# Patient Record
Sex: Female | Born: 1985 | Race: White | Hispanic: No | Marital: Single | Smoking: Never smoker
Health system: Southern US, Community
[De-identification: ages and names within clinical notes are randomized; demographics above are authoritative.]

## PROBLEM LIST (undated history)

## (undated) DIAGNOSIS — I471 Supraventricular tachycardia, unspecified: Secondary | ICD-10-CM

## (undated) DIAGNOSIS — J45909 Unspecified asthma, uncomplicated: Secondary | ICD-10-CM

## (undated) DIAGNOSIS — F419 Anxiety disorder, unspecified: Secondary | ICD-10-CM

## (undated) DIAGNOSIS — I10 Essential (primary) hypertension: Secondary | ICD-10-CM

## (undated) DIAGNOSIS — E063 Autoimmune thyroiditis: Secondary | ICD-10-CM

## (undated) DIAGNOSIS — R519 Headache, unspecified: Secondary | ICD-10-CM

## (undated) DIAGNOSIS — M329 Systemic lupus erythematosus, unspecified: Secondary | ICD-10-CM

## (undated) DIAGNOSIS — E039 Hypothyroidism, unspecified: Secondary | ICD-10-CM

## (undated) HISTORY — DX: Hypothyroidism, unspecified: E03.9

## (undated) HISTORY — DX: Headache, unspecified: R51.9

## (undated) HISTORY — DX: Unspecified asthma, uncomplicated: J45.909

## (undated) HISTORY — DX: Anxiety disorder, unspecified: F41.9

## (undated) HISTORY — DX: Supraventricular tachycardia: I47.1

## (undated) HISTORY — DX: Supraventricular tachycardia, unspecified: I47.10

---

## 2016-12-13 ENCOUNTER — Encounter (HOSPITAL_COMMUNITY): Payer: Self-pay | Admitting: *Deleted

## 2016-12-13 ENCOUNTER — Ambulatory Visit (HOSPITAL_COMMUNITY)
Admission: EM | Admit: 2016-12-13 | Discharge: 2016-12-15 | Disposition: A | Discharge status: Home / Self Care | Payer: Managed Care, Other (non HMO) | Attending: Surgery | Admitting: Surgery

## 2016-12-13 DIAGNOSIS — K353 Acute appendicitis with localized peritonitis, without perforation or gangrene: Secondary | ICD-10-CM

## 2016-12-13 DIAGNOSIS — I10 Essential (primary) hypertension: Secondary | ICD-10-CM | POA: Diagnosis not present

## 2016-12-13 DIAGNOSIS — K352 Acute appendicitis with generalized peritonitis: Principal | ICD-10-CM | POA: Insufficient documentation

## 2016-12-13 DIAGNOSIS — Z7951 Long term (current) use of inhaled steroids: Secondary | ICD-10-CM | POA: Diagnosis not present

## 2016-12-13 DIAGNOSIS — K3580 Unspecified acute appendicitis: Secondary | ICD-10-CM | POA: Diagnosis present

## 2016-12-13 DIAGNOSIS — K37 Unspecified appendicitis: Secondary | ICD-10-CM | POA: Diagnosis present

## 2016-12-13 DIAGNOSIS — R000 Tachycardia, unspecified: Secondary | ICD-10-CM | POA: Diagnosis not present

## 2016-12-13 DIAGNOSIS — Z79899 Other long term (current) drug therapy: Secondary | ICD-10-CM | POA: Insufficient documentation

## 2016-12-13 HISTORY — DX: Essential (primary) hypertension: I10

## 2016-12-13 LAB — CBC
HEMATOCRIT: 45 % (ref 36.0–46.0)
HEMOGLOBIN: 15.2 g/dL — AB (ref 12.0–15.0)
MCH: 28.1 pg (ref 26.0–34.0)
MCHC: 33.8 g/dL (ref 30.0–36.0)
MCV: 83.2 fL (ref 78.0–100.0)
Platelets: 330 10*3/uL (ref 150–400)
RBC: 5.41 MIL/uL — AB (ref 3.87–5.11)
RDW: 14.3 % (ref 11.5–15.5)
WBC: 21 10*3/uL — AB (ref 4.0–10.5)

## 2016-12-13 LAB — COMPREHENSIVE METABOLIC PANEL
ALBUMIN: 3.6 g/dL (ref 3.5–5.0)
ALK PHOS: 36 U/L — AB (ref 38–126)
ALT: 16 U/L (ref 14–54)
ANION GAP: 7 (ref 5–15)
AST: 20 U/L (ref 15–41)
BUN: 11 mg/dL (ref 6–20)
CO2: 26 mmol/L (ref 22–32)
CREATININE: 0.96 mg/dL (ref 0.44–1.00)
Calcium: 8.6 mg/dL — ABNORMAL LOW (ref 8.9–10.3)
Chloride: 103 mmol/L (ref 101–111)
GFR calc Af Amer: >60 mL/min (ref >60)
GFR calc non Af Amer: >60 mL/min (ref >60)
Glucose, Bld: 104 mg/dL — ABNORMAL HIGH (ref 65–99)
Potassium: 4.3 mmol/L (ref 3.5–5.1)
SODIUM: 136 mmol/L (ref 135–145)
Total Bilirubin: 0.4 mg/dL (ref 0.3–1.2)
Total Protein: 5.5 g/dL — ABNORMAL LOW (ref 6.5–8.1)

## 2016-12-13 LAB — URINALYSIS, ROUTINE W REFLEX MICROSCOPIC
Bilirubin Urine: NEGATIVE
GLUCOSE, UA: NEGATIVE mg/dL
HGB URINE DIPSTICK: NEGATIVE
Ketones, ur: NEGATIVE mg/dL
LEUKOCYTES UA: NEGATIVE
Nitrite: NEGATIVE
PH: 6 (ref 5.0–8.0)
PROTEIN: NEGATIVE mg/dL
SPECIFIC GRAVITY, URINE: 1.016 (ref 1.005–1.030)

## 2016-12-13 LAB — POC URINE PREG, ED: Preg Test, Ur: NEGATIVE

## 2016-12-13 LAB — LIPASE, BLOOD: Lipase: 18 U/L (ref 11–51)

## 2016-12-13 NOTE — ED Triage Notes (Signed)
The pt has had abd pain all week and not feeling well  Her abd pain is worse today and she has felt worse   With fever  lmp  2 weeks ago

## 2016-12-14 ENCOUNTER — Inpatient Hospital Stay (HOSPITAL_COMMUNITY): Payer: Managed Care, Other (non HMO) | Admitting: Certified Registered Nurse Anesthetist

## 2016-12-14 ENCOUNTER — Encounter (HOSPITAL_COMMUNITY): Admission: EM | Disposition: A | Discharge status: Home / Self Care | Payer: Self-pay | Source: Home / Self Care

## 2016-12-14 ENCOUNTER — Emergency Department (HOSPITAL_COMMUNITY): Payer: Managed Care, Other (non HMO)

## 2016-12-14 ENCOUNTER — Encounter (HOSPITAL_COMMUNITY): Payer: Self-pay | Admitting: Radiology

## 2016-12-14 DIAGNOSIS — K3580 Unspecified acute appendicitis: Secondary | ICD-10-CM | POA: Diagnosis present

## 2016-12-14 DIAGNOSIS — K37 Unspecified appendicitis: Secondary | ICD-10-CM | POA: Diagnosis present

## 2016-12-14 HISTORY — PX: LAPAROSCOPIC APPENDECTOMY: SHX408

## 2016-12-14 LAB — SURGICAL PCR SCREEN
MRSA, PCR: NEGATIVE
STAPHYLOCOCCUS AUREUS: POSITIVE — AB

## 2016-12-14 LAB — HIV ANTIBODY (ROUTINE TESTING W REFLEX): HIV Screen 4th Generation wRfx: NONREACTIVE

## 2016-12-14 SURGERY — APPENDECTOMY, LAPAROSCOPIC
Anesthesia: General | Site: Abdomen

## 2016-12-14 MED ORDER — IOPAMIDOL (ISOVUE-300) INJECTION 61%
INTRAVENOUS | Status: AC
  Administered 2016-12-14: 100 mL
  Filled 2016-12-14: qty 100

## 2016-12-14 MED ORDER — MEPERIDINE HCL 25 MG/ML IJ SOLN: 6.2500 mg | INTRAMUSCULAR | Status: DC | PRN

## 2016-12-14 MED ORDER — LIDOCAINE HCL (CARDIAC) 20 MG/ML IV SOLN
INTRAVENOUS | Status: DC | PRN
  Administered 2016-12-14: 100 mg via INTRAVENOUS

## 2016-12-14 MED ORDER — BUPIVACAINE HCL (PF) 0.25 % IJ SOLN
INTRAMUSCULAR | Status: DC | PRN
  Administered 2016-12-14: 20 mL

## 2016-12-14 MED ORDER — ONDANSETRON HCL 4 MG/2ML IJ SOLN
4.0000 mg | Freq: Once | INTRAMUSCULAR | Status: AC
  Administered 2016-12-14: 4 mg via INTRAVENOUS
  Filled 2016-12-14: qty 2

## 2016-12-14 MED ORDER — 0.9 % SODIUM CHLORIDE (POUR BTL) OPTIME
TOPICAL | Status: DC | PRN
  Administered 2016-12-14: 1000 mL

## 2016-12-14 MED ORDER — MORPHINE SULFATE (PF) 4 MG/ML IV SOLN
1.0000 mg | INTRAVENOUS | Status: DC | PRN
  Administered 2016-12-15: 4 mg via INTRAVENOUS
  Filled 2016-12-14: qty 1

## 2016-12-14 MED ORDER — KCL IN DEXTROSE-NACL 20-5-0.9 MEQ/L-%-% IV SOLN
INTRAVENOUS | Status: DC
  Administered 2016-12-14: 15:00:00 via INTRAVENOUS
  Filled 2016-12-14 (×2): qty 1000

## 2016-12-14 MED ORDER — ONDANSETRON 4 MG PO TBDP
4.0000 mg | ORAL_TABLET | Freq: Four times a day (QID) | ORAL | Status: DC | PRN
Start: 2016-12-14 — End: 2016-12-15

## 2016-12-14 MED ORDER — ATENOLOL 25 MG PO TABS
25.0000 mg | ORAL_TABLET | Freq: Every day | ORAL | Status: DC
  Administered 2016-12-14 – 2016-12-15 (×2): 25 mg via ORAL
  Filled 2016-12-14 (×2): qty 1

## 2016-12-14 MED ORDER — DEXTROSE 5 % IV SOLN
2.0000 g | INTRAVENOUS | Status: DC
Start: 2016-12-14 — End: 2016-12-14
  Administered 2016-12-14: 2 g via INTRAVENOUS
  Filled 2016-12-14: qty 2

## 2016-12-14 MED ORDER — KETOROLAC TROMETHAMINE 30 MG/ML IJ SOLN
30.0000 mg | Freq: Once | INTRAMUSCULAR | Status: DC | PRN
  Administered 2016-12-14: 30 mg via INTRAVENOUS

## 2016-12-14 MED ORDER — ONDANSETRON HCL 4 MG/2ML IJ SOLN
INTRAMUSCULAR | Status: DC | PRN
  Administered 2016-12-14: 4 mg via INTRAVENOUS

## 2016-12-14 MED ORDER — ONDANSETRON 4 MG PO TBDP: 4.0000 mg | ORAL_TABLET | Freq: Four times a day (QID) | ORAL | Status: DC | PRN

## 2016-12-14 MED ORDER — PROMETHAZINE HCL 25 MG/ML IJ SOLN
INTRAMUSCULAR | Status: AC
  Filled 2016-12-14: qty 1

## 2016-12-14 MED ORDER — EPHEDRINE 5 MG/ML INJ
INTRAVENOUS | Status: AC
  Filled 2016-12-14: qty 10

## 2016-12-14 MED ORDER — SUCCINYLCHOLINE CHLORIDE 200 MG/10ML IV SOSY
PREFILLED_SYRINGE | INTRAVENOUS | Status: AC
  Filled 2016-12-14: qty 10

## 2016-12-14 MED ORDER — MIDAZOLAM HCL 5 MG/5ML IJ SOLN
INTRAMUSCULAR | Status: DC | PRN
  Administered 2016-12-14: 2 mg via INTRAVENOUS

## 2016-12-14 MED ORDER — HYDROMORPHONE HCL 1 MG/ML IJ SOLN
0.2500 mg | INTRAMUSCULAR | Status: DC | PRN
  Administered 2016-12-14: 0.5 mg via INTRAVENOUS

## 2016-12-14 MED ORDER — PANTOPRAZOLE SODIUM 40 MG IV SOLR
40.0000 mg | Freq: Every day | INTRAVENOUS | Status: DC
  Administered 2016-12-14: 40 mg via INTRAVENOUS
  Filled 2016-12-14: qty 40

## 2016-12-14 MED ORDER — HYDROCODONE-ACETAMINOPHEN 5-325 MG PO TABS
1.0000 | ORAL_TABLET | ORAL | Status: DC | PRN
  Administered 2016-12-14 (×2): 1 via ORAL
  Filled 2016-12-14: qty 2
  Filled 2016-12-14 (×2): qty 1

## 2016-12-14 MED ORDER — HYDROMORPHONE HCL 1 MG/ML IJ SOLN
0.5000 mg | Freq: Once | INTRAMUSCULAR | Status: AC
  Administered 2016-12-14: 0.5 mg via INTRAVENOUS
  Filled 2016-12-14: qty 1

## 2016-12-14 MED ORDER — PROPOFOL 10 MG/ML IV BOLUS
INTRAVENOUS | Status: AC
  Filled 2016-12-14: qty 20

## 2016-12-14 MED ORDER — METRONIDAZOLE IN NACL 5-0.79 MG/ML-% IV SOLN
500.0000 mg | Freq: Three times a day (TID) | INTRAVENOUS | Status: DC
  Administered 2016-12-14: 500 mg via INTRAVENOUS
  Filled 2016-12-14 (×2): qty 100

## 2016-12-14 MED ORDER — HYDROMORPHONE HCL 1 MG/ML IJ SOLN
INTRAMUSCULAR | Status: AC
  Filled 2016-12-14: qty 0.5

## 2016-12-14 MED ORDER — KETOROLAC TROMETHAMINE 30 MG/ML IJ SOLN
INTRAMUSCULAR | Status: AC
  Filled 2016-12-14: qty 1

## 2016-12-14 MED ORDER — HEPARIN SODIUM (PORCINE) 5000 UNIT/ML IJ SOLN
5000.0000 [IU] | Freq: Three times a day (TID) | INTRAMUSCULAR | Status: DC
  Administered 2016-12-15: 5000 [IU] via SUBCUTANEOUS
  Filled 2016-12-14: qty 1

## 2016-12-14 MED ORDER — MIDAZOLAM HCL 2 MG/2ML IJ SOLN
INTRAMUSCULAR | Status: AC
  Filled 2016-12-14: qty 2

## 2016-12-14 MED ORDER — SUCCINYLCHOLINE CHLORIDE 200 MG/10ML IV SOSY
PREFILLED_SYRINGE | INTRAVENOUS | Status: DC | PRN
  Administered 2016-12-14: 100 mg via INTRAVENOUS

## 2016-12-14 MED ORDER — SODIUM CHLORIDE 0.9 % IV SOLN
Freq: Once | INTRAVENOUS | Status: AC
  Administered 2016-12-14: via INTRAVENOUS

## 2016-12-14 MED ORDER — ENOXAPARIN SODIUM 40 MG/0.4ML ~~LOC~~ SOLN: 40.0000 mg | SUBCUTANEOUS | Status: DC

## 2016-12-14 MED ORDER — ALBUTEROL SULFATE (2.5 MG/3ML) 0.083% IN NEBU: 3.0000 mL | INHALATION_SOLUTION | Freq: Four times a day (QID) | RESPIRATORY_TRACT | Status: DC | PRN

## 2016-12-14 MED ORDER — ONDANSETRON HCL 4 MG/2ML IJ SOLN
INTRAMUSCULAR | Status: AC
  Filled 2016-12-14: qty 2

## 2016-12-14 MED ORDER — BUPIVACAINE HCL (PF) 0.25 % IJ SOLN
INTRAMUSCULAR | Status: AC
  Filled 2016-12-14: qty 30

## 2016-12-14 MED ORDER — FENTANYL CITRATE (PF) 250 MCG/5ML IJ SOLN
INTRAMUSCULAR | Status: AC
  Filled 2016-12-14: qty 5

## 2016-12-14 MED ORDER — PROMETHAZINE HCL 25 MG/ML IJ SOLN
6.2500 mg | INTRAMUSCULAR | Status: DC | PRN
  Administered 2016-12-14: 6.25 mg via INTRAVENOUS

## 2016-12-14 MED ORDER — ONDANSETRON HCL 4 MG/2ML IJ SOLN: 4.0000 mg | Freq: Four times a day (QID) | INTRAMUSCULAR | Status: DC | PRN

## 2016-12-14 MED ORDER — LACTATED RINGERS IV SOLN
INTRAVENOUS | Status: DC
  Administered 2016-12-14 (×2): via INTRAVENOUS

## 2016-12-14 MED ORDER — PROPOFOL 10 MG/ML IV BOLUS
INTRAVENOUS | Status: DC | PRN
  Administered 2016-12-14: 200 mg via INTRAVENOUS

## 2016-12-14 MED ORDER — ROCURONIUM BROMIDE 100 MG/10ML IV SOLN
INTRAVENOUS | Status: DC | PRN
  Administered 2016-12-14: 50 mg via INTRAVENOUS

## 2016-12-14 MED ORDER — HYDROMORPHONE HCL 1 MG/ML IJ SOLN
1.0000 mg | INTRAMUSCULAR | Status: DC | PRN
  Administered 2016-12-14: 1 mg via INTRAVENOUS
  Filled 2016-12-14: qty 1

## 2016-12-14 MED ORDER — SODIUM CHLORIDE 0.9 % IR SOLN
Status: DC | PRN
Start: 2016-12-14 — End: 2016-12-14
  Administered 2016-12-14: 1000 mL

## 2016-12-14 MED ORDER — DEXTROSE IN LACTATED RINGERS 5 % IV SOLN
INTRAVENOUS | Status: DC
  Administered 2016-12-14: 05:00:00 via INTRAVENOUS

## 2016-12-14 MED ORDER — FENTANYL CITRATE (PF) 100 MCG/2ML IJ SOLN
INTRAMUSCULAR | Status: DC | PRN
  Administered 2016-12-14: 200 ug via INTRAVENOUS

## 2016-12-14 MED ORDER — SUGAMMADEX SODIUM 200 MG/2ML IV SOLN
INTRAVENOUS | Status: DC | PRN
  Administered 2016-12-14: 400 mg via INTRAVENOUS

## 2016-12-14 SURGICAL SUPPLY — 33 items
APPLIER CLIP ROT 10 11.4 M/L (STAPLE) ×3
BLADE CLIPPER SURG (BLADE) IMPLANT
CANISTER SUCT 3000ML PPV (MISCELLANEOUS) ×3 IMPLANT
CHLORAPREP W/TINT 26ML (MISCELLANEOUS) ×3 IMPLANT
CLIP APPLIE ROT 10 11.4 M/L (STAPLE) ×1 IMPLANT
COVER SURGICAL LIGHT HANDLE (MISCELLANEOUS) ×3 IMPLANT
CUTTER FLEX LINEAR 45M (STAPLE) ×3 IMPLANT
DERMABOND ADVANCED (GAUZE/BANDAGES/DRESSINGS) ×2
DERMABOND ADVANCED .7 DNX12 (GAUZE/BANDAGES/DRESSINGS) ×1 IMPLANT
ELECT REM PT RETURN 9FT ADLT (ELECTROSURGICAL) ×3
ELECTRODE REM PT RTRN 9FT ADLT (ELECTROSURGICAL) ×1 IMPLANT
ENDOLOOP SUT PDS II  0 18 (SUTURE)
ENDOLOOP SUT PDS II 0 18 (SUTURE) IMPLANT
GLOVE BIO SURGEON STRL SZ7.5 (GLOVE) ×3 IMPLANT
GOWN STRL REUS W/ TWL LRG LVL3 (GOWN DISPOSABLE) ×3 IMPLANT
GOWN STRL REUS W/TWL LRG LVL3 (GOWN DISPOSABLE) ×6
KIT BASIN OR (CUSTOM PROCEDURE TRAY) ×3 IMPLANT
KIT ROOM TURNOVER OR (KITS) ×3 IMPLANT
NS IRRIG 1000ML POUR BTL (IV SOLUTION) ×3 IMPLANT
PAD ARMBOARD 7.5X6 YLW CONV (MISCELLANEOUS) ×6 IMPLANT
POUCH SPECIMEN RETRIEVAL 10MM (ENDOMECHANICALS) ×3 IMPLANT
RELOAD STAPLE TA45 3.5 REG BLU (ENDOMECHANICALS) ×3 IMPLANT
SET IRRIG TUBING LAPAROSCOPIC (IRRIGATION / IRRIGATOR) ×3 IMPLANT
SHEARS HARMONIC ACE PLUS 36CM (ENDOMECHANICALS) ×3 IMPLANT
SPECIMEN JAR SMALL (MISCELLANEOUS) ×3 IMPLANT
SUT MNCRL AB 4-0 PS2 18 (SUTURE) ×3 IMPLANT
TOWEL OR 17X24 6PK STRL BLUE (TOWEL DISPOSABLE) ×3 IMPLANT
TOWEL OR 17X26 10 PK STRL BLUE (TOWEL DISPOSABLE) ×3 IMPLANT
TRAY FOLEY CATH SILVER 16FR (SET/KITS/TRAYS/PACK) ×3 IMPLANT
TRAY LAPAROSCOPIC MC (CUSTOM PROCEDURE TRAY) ×3 IMPLANT
TROCAR XCEL BLUNT TIP 100MML (ENDOMECHANICALS) ×3 IMPLANT
TROCAR XCEL NON-BLD 5MMX100MML (ENDOMECHANICALS) ×6 IMPLANT
TUBING INSUFFLATION (TUBING) ×3 IMPLANT

## 2016-12-14 NOTE — Progress Notes (Signed)
Patient OOB to shower, tolerated well.

## 2016-12-14 NOTE — Progress Notes (Signed)
CC:  Abdominal pain Subjective: Having some intermittent chills, but does not feel febrile. Ongoing pain lower abdomen, more on the right side than the left.    Objective: Vital signs in last 24 hours: Temp:  [98.1 F (36.7 C)-100.3 F (37.9 C)] 98.1 F (36.7 C) (05/09 2251) Pulse Rate:  [66-119] 66 (05/10 0430) Resp:  [20-22] 22 (05/09 2251) BP: (102-126)/(47-78) 102/47 (05/10 0430) SpO2:  [94 %-99 %] 96 % (05/10 0430) Last BM Date: 12/12/16 TM 100.3 no temp since 2200 last PM VSS  BP ok here with normal HR Labs last PM WBC 21K   Intake/Output from previous day: No intake/output data recorded. Intake/Output this shift: No intake/output data recorded.  General appearance: alert, cooperative and no distress GI: soft, tender right lower quadrant  Lab Results:   Recent Labs  12/13/16 2158  WBC 21.0*  HGB 15.2*  HCT 45.0  PLT 330    BMET  Recent Labs  12/13/16 2158  NA 136  K 4.3  CL 103  CO2 26  GLUCOSE 104*  BUN 11  CREATININE 0.96  CALCIUM 8.6*   PT/INR No results for input(s): LABPROT, INR in the last 72 hours.   Recent Labs Lab 12/13/16 2158  AST 20  ALT 16  ALKPHOS 36*  BILITOT 0.4  PROT 5.5*  ALBUMIN 3.6     Lipase     Component Value Date/Time   LIPASE 18 12/13/2016 2158     Studies/Results: Ct Abdomen Pelvis W Contrast  Result Date: 12/14/2016 CLINICAL DATA:  Right lower quadrant pain starting on Monday. Nausea and fever. EXAM: CT ABDOMEN AND PELVIS WITH CONTRAST TECHNIQUE: Multidetector CT imaging of the abdomen and pelvis was performed using the standard protocol following bolus administration of intravenous contrast. CONTRAST:  ISOVUE-300 IOPAMIDOL (ISOVUE-300) INJECTION 61% COMPARISON:  None. FINDINGS: Lower chest: Infiltration or atelectasis in both lung bases, greater on the left. Focal pneumonia not excluded. Hepatobiliary: No focal liver abnormality is seen. No gallstones, gallbladder wall thickening, or biliary  dilatation. Pancreas: Unremarkable. No pancreatic ductal dilatation or surrounding inflammatory changes. Spleen: Normal in size without focal abnormality. Adrenals/Urinary Tract: Adrenal glands are unremarkable. Kidneys are normal, without renal calculi, focal lesion, or hydronephrosis. Bladder is unremarkable. Stomach/Bowel: The distal appendix is distended with diameter measuring up to 13 mm. There is stranding around the distal appendix. Changes are consistent with tip appendicitis. No abscess. Stomach, small bowel, and colon are not abnormally distended. No wall thickening is identified. Scattered stool throughout the colon. Vascular/Lymphatic: No significant vascular findings are present. No enlarged abdominal or pelvic lymph nodes. Reproductive: Uterus is retroverted. Small cyst demonstrated in the left ovary, likely functional. No abnormal adnexal masses. Other: No abdominal wall hernia or abnormality. No abdominopelvic ascites. Musculoskeletal: No acute or significant osseous findings. IMPRESSION: Distal appendix is distended with stranding in the surrounding fat consistent with tip appendicitis. No abscess. Electronically Signed   By: Burman Nieves M.D.   On: 12/14/2016 01:24   Prior to Admission medications   Medication Sig Start Date End Date Taking? Authorizing Provider  albuterol (PROVENTIL HFA;VENTOLIN HFA) 108 (90 Base) MCG/ACT inhaler Inhale 1-2 puffs into the lungs every 6 (six) hours as needed for wheezing or shortness of breath.   Yes [provider]  atenolol (TENORMIN) 25 MG tablet Take 25 mg by mouth daily.   Yes [provider]  cetirizine (ZYRTEC) 10 MG tablet Take 10 mg by mouth daily.   Yes [provider]  cyclobenzaprine (FLEXERIL) 10  MG tablet Take 5-10 mg by mouth 3 (three) times daily as needed for muscle spasms.   Yes [provider]    Medications: . [START ON 12/15/2016] enoxaparin (LOVENOX) injection  40 mg Subcutaneous Q24H   .  dextrose 5% lactated ringers 125 mL/hr at 12/14/16 0510   Anti-infectives    Start     Dose/Rate Route Frequency Ordered Stop   12/14/16 0600  cefTRIAXone (ROCEPHIN) 2 g in dextrose 5 % 50 mL IVPB  Status:  Discontinued     2 g 100 mL/hr over 30 Minutes Intravenous Every 24 hours 12/14/16 0455 12/14/16 0738   12/14/16 0600  metroNIDAZOLE (FLAGYL) IVPB 500 mg  Status:  Discontinued     500 mg 100 mL/hr over 60 Minutes Intravenous Every 8 hours 12/14/16 0455 12/14/16 0738     Assessment/Plan Acute appendicitis Hypertension Tachycardia FEN:  IV fluids/NPO ID:  Pre op DVT:  SCD/Lovenox   Plan:  Surgery this AM.       LOS: 0 days    Sheri Johnson 12/14/2016 [phone]

## 2016-12-14 NOTE — H&P (Signed)
Sheri Johnson is an 31 y.o. female.   Chief Complaint: Abdominal pain HPI: Patient presents to the emergency room with a 3-day history of right lower quadrant abdominal pain. The pain started in her stomach repair upper abdominal region she states. It is now moved down to her right lower quadrant of her abdomen. It is constant. It is made worse with movement or pushing on the area. She's had some anorexia and nausea. No blood in her stool or diarrhea. He shows acute appendicitis without perforation. The pain is severe in nature as well as cramping. It is made better with pain medicine. Past Medical History:  Diagnosis Date  . Hypertension     History reviewed. No pertinent surgical history.  No family history on file. Social History:  reports that she has never smoked. She has never used smokeless tobacco. She reports that she does not drink alcohol. Her drug history is not on file.  Allergies: No Known Allergies  Medications Prior to Admission  Medication Sig Dispense Refill  . albuterol (PROVENTIL HFA;VENTOLIN HFA) 108 (90 Base) MCG/ACT inhaler Inhale 1-2 puffs into the lungs every 6 (six) hours as needed for wheezing or shortness of breath.    Marland Kitchen atenolol (TENORMIN) 25 MG tablet Take 25 mg by mouth daily.    . cetirizine (ZYRTEC) 10 MG tablet Take 10 mg by mouth daily.    . cyclobenzaprine (FLEXERIL) 10 MG tablet Take 5-10 mg by mouth 3 (three) times daily as needed for muscle spasms.      Results for orders placed or performed during the hospital encounter of 12/13/16 (from the past 48 hour(s))  Urinalysis, Routine w reflex microscopic     Status: Abnormal   Collection Time: 12/13/16  9:55 PM  Result Value Ref Range   Color, Urine YELLOW YELLOW   APPearance HAZY (A) CLEAR   Specific Gravity, Urine 1.016 1.005 - 1.030   pH 6.0 5.0 - 8.0   Glucose, UA NEGATIVE NEGATIVE mg/dL   Hgb urine dipstick NEGATIVE NEGATIVE   Bilirubin Urine NEGATIVE NEGATIVE   Ketones, ur NEGATIVE  NEGATIVE mg/dL   Protein, ur NEGATIVE NEGATIVE mg/dL   Nitrite NEGATIVE NEGATIVE   Leukocytes, UA NEGATIVE NEGATIVE  Lipase, blood     Status: None   Collection Time: 12/13/16  9:58 PM  Result Value Ref Range   Lipase 18 11 - 51 U/L  Comprehensive metabolic panel     Status: Abnormal   Collection Time: 12/13/16  9:58 PM  Result Value Ref Range   Sodium 136 135 - 145 mmol/L   Potassium 4.3 3.5 - 5.1 mmol/L   Chloride 103 101 - 111 mmol/L   CO2 26 22 - 32 mmol/L   Glucose, Bld 104 (H) 65 - 99 mg/dL   BUN 11 6 - 20 mg/dL   Creatinine, Ser 0.96 0.44 - 1.00 mg/dL   Calcium 8.6 (L) 8.9 - 10.3 mg/dL   Total Protein 5.5 (L) 6.5 - 8.1 g/dL   Albumin 3.6 3.5 - 5.0 g/dL   AST 20 15 - 41 U/L   ALT 16 14 - 54 U/L   Alkaline Phosphatase 36 (L) 38 - 126 U/L   Total Bilirubin 0.4 0.3 - 1.2 mg/dL   GFR calc non Af Amer >60 >60 mL/min   GFR calc Af Amer >60 >60 mL/min    Comment: (NOTE) The eGFR has been calculated using the CKD EPI equation. This calculation has not been validated in all clinical situations. eGFR's persistently <60 mL/min  signify possible Chronic Kidney Disease.    Anion gap 7 5 - 15  CBC     Status: Abnormal   Collection Time: 12/13/16  9:58 PM  Result Value Ref Range   WBC 21.0 (H) 4.0 - 10.5 K/uL   RBC 5.41 (H) 3.87 - 5.11 MIL/uL   Hemoglobin 15.2 (H) 12.0 - 15.0 g/dL   HCT 45.0 36.0 - 46.0 %   MCV 83.2 78.0 - 100.0 fL   MCH 28.1 26.0 - 34.0 pg   MCHC 33.8 30.0 - 36.0 g/dL   RDW 14.3 11.5 - 15.5 %   Platelets 330 150 - 400 K/uL  POC urine preg, ED     Status: None   Collection Time: 12/13/16 10:11 PM  Result Value Ref Range   Preg Test, Ur NEGATIVE NEGATIVE    Comment:        THE SENSITIVITY OF THIS METHODOLOGY IS >24 mIU/mL    Ct Abdomen Pelvis W Contrast  Result Date: 12/14/2016 CLINICAL DATA:  Right lower quadrant pain starting on Monday. Nausea and fever. EXAM: CT ABDOMEN AND PELVIS WITH CONTRAST TECHNIQUE: Multidetector CT imaging of the abdomen and  pelvis was performed using the standard protocol following bolus administration of intravenous contrast. CONTRAST:  191m ISOVUE-300 IOPAMIDOL (ISOVUE-300) INJECTION 61% COMPARISON:  None. FINDINGS: Lower chest: Infiltration or atelectasis in both lung bases, greater on the left. Focal pneumonia not excluded. Hepatobiliary: No focal liver abnormality is seen. No gallstones, gallbladder wall thickening, or biliary dilatation. Pancreas: Unremarkable. No pancreatic ductal dilatation or surrounding inflammatory changes. Spleen: Normal in size without focal abnormality. Adrenals/Urinary Tract: Adrenal glands are unremarkable. Kidneys are normal, without renal calculi, focal lesion, or hydronephrosis. Bladder is unremarkable. Stomach/Bowel: The distal appendix is distended with diameter measuring up to 13 mm. There is stranding around the distal appendix. Changes are consistent with tip appendicitis. No abscess. Stomach, small bowel, and colon are not abnormally distended. No wall thickening is identified. Scattered stool throughout the colon. Vascular/Lymphatic: No significant vascular findings are present. No enlarged abdominal or pelvic lymph nodes. Reproductive: Uterus is retroverted. Small cyst demonstrated in the left ovary, likely functional. No abnormal adnexal masses. Other: No abdominal wall hernia or abnormality. No abdominopelvic ascites. Musculoskeletal: No acute or significant osseous findings. IMPRESSION: Distal appendix is distended with stranding in the surrounding fat consistent with tip appendicitis. No abscess. Electronically Signed   By: WLucienne CapersM.D.   On: 12/14/2016 01:24    Review of Systems  Constitutional: Negative.  Negative for chills and fever.  HENT: Negative.  Negative for hearing loss and tinnitus.   Eyes: Negative.  Negative for blurred vision and double vision.  Respiratory: Negative.  Negative for cough and hemoptysis.   Cardiovascular: Negative.  Negative for chest pain  and palpitations.  Gastrointestinal: Positive for abdominal pain and nausea.  Genitourinary: Negative for dysuria and urgency.  Musculoskeletal: Negative for myalgias and neck pain.  Skin: Negative.  Negative for itching and rash.  Neurological: Negative for dizziness and headaches.  Endo/Heme/Allergies: Negative for environmental allergies. Does not bruise/bleed easily.  Psychiatric/Behavioral: Negative for depression and suicidal ideas.    Blood pressure (!) 102/47, pulse 66, temperature 98.1 F (36.7 C), temperature source Oral, resp. rate (!) 22, last menstrual period 11/22/2016, SpO2 96 %. Physical Exam  Constitutional: She is oriented to person, place, and time. She appears well-developed and well-nourished.  HENT:  Head: Normocephalic and atraumatic.  Eyes: Pupils are equal, round, and reactive to light. No scleral icterus.  Neck: Normal range of motion. Neck supple.  Cardiovascular: Normal rate and regular rhythm.   Respiratory: Effort normal and breath sounds normal. No respiratory distress.  GI: There is tenderness in the right lower quadrant. There is guarding and tenderness at McBurney's point.  Musculoskeletal: Normal range of motion.  Neurological: She is oriented to person, place, and time.  Skin: Skin is warm and dry.  Psychiatric: She has a normal mood and affect. Her behavior is normal.     Assessment/Plan Acute appendicitis  Admit for IV fluids and antibiotics.  Appendectomy later today by Dr. Burnell Blanks., MD 12/14/2016, 6:15 AM

## 2016-12-14 NOTE — Interval H&P Note (Signed)
History and Physical Interval Note:  12/14/2016 10:01 AM  Sheri DroneLaura Johnson  has presented today for surgery, with the diagnosis of Acute Appendicitis   The various methods of treatment have been discussed with the patient and family. After consideration of risks, benefits and other options for treatment, the patient has consented to  Procedure(s): APPENDECTOMY LAPAROSCOPIC (N/A) as a surgical intervention .  The patient's history has been reviewed, patient examined, no change in status, stable for surgery.  I have reviewed the patient's chart and labs.  Questions were answered to the patient's satisfaction.     TOTH III,PAUL S

## 2016-12-14 NOTE — Transfer of Care (Signed)
Immediate Anesthesia Transfer of Care Note  Patient: Felipe DroneLaura Matera  Procedure(s) Performed: Procedure(s): APPENDECTOMY LAPAROSCOPIC (N/A)  Patient Location: PACU  Anesthesia Type:General  Level of Consciousness: awake and patient cooperative  Airway & Oxygen Therapy: Patient Spontanous Breathing and Patient connected to nasal cannula oxygen  Post-op Assessment: Report given to RN, Post -op Vital signs reviewed and stable and Patient moving all extremities X 4  Post vital signs: Reviewed and stable  Last Vitals:  Vitals:   12/14/16 0430 12/14/16 1147  BP: (!) 102/47   Pulse: 66   Resp:    Temp:  36.9 C    Last Pain:  Vitals:   12/14/16 1147  TempSrc:   PainSc: (P) Asleep         Complications: No apparent anesthesia complications

## 2016-12-14 NOTE — Progress Notes (Signed)
Patient ambulating unit hall X 2.5 laps, tolerated well.

## 2016-12-14 NOTE — Discharge Instructions (Signed)

## 2016-12-14 NOTE — Op Note (Addendum)
12/13/2016 - 12/14/2016  11:25 AM  PATIENT:  Sheri Johnson  31 y.o. female  PRE-OPERATIVE DIAGNOSIS:  Acute Appendicitis   POST-OPERATIVE DIAGNOSIS:  Acute Appendicitis   PROCEDURE:  Procedure(s): APPENDECTOMY LAPAROSCOPIC (N/A)  SURGEON:  Surgeon(s) and Role:    Griselda Miner, MD - Primary  PHYSICIAN ASSISTANT:   ASSISTANTS: none   ANESTHESIA:   local and general  EBL:  Total I/O In: 1000 [I.V.:1000] Out: 100 [Urine:100]  BLOOD ADMINISTERED:none  DRAINS: none   LOCAL MEDICATIONS USED:  MARCAINE     SPECIMEN:  Source of Specimen:  appendix  DISPOSITION OF SPECIMEN:  PATHOLOGY  COUNTS:  YES  TOURNIQUET:  * No tourniquets in log *  DICTATION: .Dragon Dictation   After informed consent was obtained patient was brought to the operating room placed in the supine position on the operating room table. After adequate induction of general anesthesia the patient's abdomen was prepped with ChloraPrep, allowed to dry, and draped in usual sterile manner. An appropriate timeout was performed. The area below the umbilicus was infiltrated with quarter percent Marcaine. A small incision was made with a 15 blade knife. This incision was carried down through the subcutaneous tissue bluntly with a hemostat and [employer] retractors until the linea alba was identified. The linea alba was incised with a 15 blade knife. Each side was grasped Coker clamps and elevated anteriorly. The preperitoneal space was probed bluntly with a hemostat until the peritoneum was opened and access was gained to the abdominal cavity. A 0 Vicryl purse string stitch was placed in the fascia surrounding the opening. A Hassan cannula was placed through the opening and anchored in place with the previously placed Vicryl purse string stitch. The laparoscope was placed through the [facility] cannula. The abdomen was insufflated with carbon dioxide without difficulty. Next the suprapubic area was infiltrated with quarter  percent Marcaine. A small incision was made with a 15 blade knife. A 5 mm port was placed bluntly through this incision into the abdominal cavity. A site was then chosen between the 2 port for placement of a 5 mm port. The area was infiltrated with quarter percent Marcaine. A small stab incision was made with a 15 blade knife. A 5 mm port was placed bluntly through this incision and the abdominal cavity under direct vision. The laparoscope was then moved to the suprapubic port. Using a Glassman grasper and harmonic scalpel the right lower quadrant was inspected. The appendix was readily identified. The appendix was elevated anteriorly and the mesoappendix was taken down sharply with the harmonic scalpel. Once the base of the appendix where it joined the cecum was identified and cleared of any tissue then a laparoscopic GIA blue load 6 row stapler was placed through the [facility] cannula. The stapler was placed across the base of the appendix clamped and fired thereby dividing the base of the appendix between staple lines. A small area of bleeding from the staple line was controlled with clips. A laparoscopic bag was then inserted through the [facility] cannula. The appendix was placed within the bag and the bag was sealed. The abdomen was then irrigated with copious amounts of saline until the effluent was clear. No other abnormalities were noted except for an omental adhesion to the lower abdominal wall that was taken down with the harmonic scalpel.  The appendix and bag were removed with the [facility] cannula through the infraumbilical port without difficulty. The fascial defect was closed with the previously placed Vicryl pursestring stitch as  well as with another interrupted 0 Vicryl figure-of-eight stitch. The rest of the ports were removed under direct vision and were found to be hemostatic. The gas was allowed to escape. The skin incisions were closed with interrupted 4-0 Monocryl subcuticular stitches. Dermabond  dressings were applied. The patient tolerated the procedure well. At the end of the case all needle sponge and instrument counts were correct. The patient was then awakened and taken to recovery in stable condition.  PLAN OF CARE: Admit for overnight observation  PATIENT DISPOSITION:  PACU - hemodynamically stable.   Delay start of Pharmacological VTE agent (>24hrs) due to surgical blood loss or risk of bleeding: no

## 2016-12-14 NOTE — ED Notes (Addendum)
Zosyn 3.375 IVPB administered - per EDP order.

## 2016-12-14 NOTE — Anesthesia Procedure Notes (Signed)
Procedure Name: Intubation Date/Time: 12/14/2016 10:32 AM Performed by: Carney Living Pre-anesthesia Checklist: Patient identified, Emergency Drugs available, Suction available, Patient being monitored and Timeout performed Patient Re-evaluated:Patient Re-evaluated prior to inductionOxygen Delivery Method: Circle system utilized Preoxygenation: Pre-oxygenation with 100% oxygen Intubation Type: IV induction and Rapid sequence Laryngoscope Size: Mac and 4 Grade View: Grade I Tube size: 7.0 mm Number of attempts: 1 Airway Equipment and Method: Stylet Placement Confirmation: ETT inserted through vocal cords under direct vision,  positive ETCO2 and breath sounds checked- equal and bilateral Secured at: 22 cm Tube secured with: Tape Dental Injury: Teeth and Oropharynx as per pre-operative assessment

## 2016-12-14 NOTE — ED Notes (Signed)
Attempted to call report x 1 to [address] [place]. Left name & phone number with NS.

## 2016-12-14 NOTE — ED Notes (Signed)
Attempted to call report to floor x2. 

## 2016-12-14 NOTE — ED Notes (Signed)
Patient transported to CT 

## 2016-12-14 NOTE — Anesthesia Postprocedure Evaluation (Signed)
Anesthesia Post Note  Patient: Sheri DroneLaura Dibiasio  Procedure(s) Performed: Procedure(s) (LRB): APPENDECTOMY LAPAROSCOPIC (N/A)  Patient location during evaluation: PACU Anesthesia Type: General Level of consciousness: awake and alert Pain management: pain level controlled Vital Signs Assessment: post-procedure vital signs reviewed and stable Respiratory status: spontaneous breathing, nonlabored ventilation, respiratory function stable and patient connected to nasal cannula oxygen Cardiovascular status: blood pressure returned to baseline and stable Postop Assessment: no signs of nausea or vomiting Anesthetic complications: no       Last Vitals:  Vitals:   12/14/16 1252 12/14/16 1300  BP:  120/61  Pulse:    Resp:  14  Temp: 36.8 C 36.8 C    Last Pain:  Vitals:   12/14/16 1335  TempSrc:   PainSc: Asleep                 Anastassia Noack DAVID

## 2016-12-14 NOTE — ED Provider Notes (Signed)
[facility]-EMERGENCY DEPT Provider Note   CSN: [id number] Arrival date & time: 12/13/16  2132     History   Chief Complaint Chief Complaint  Patient presents with  . Abdominal Pain    HPI Sheri Johnson is a 31 y.o. female.  Patient reports symptoms of "not feeling well" several days ago. For the past 2 days she has had progressively worsening periumbilical and right sided abdominal pain. She reports lost appetite, nausea without vomiting and constipation. Only previous abdominal surgeries were C-sections. She has had a low grade temperature. No urinary or vaginal symptoms. No radiation of the pain to the back. No flank pain.    The history is provided by the patient. No language interpreter was used.  Abdominal Pain   This is a new problem. The current episode started 12 to 24 hours ago. The problem occurs constantly. The problem has been gradually worsening. The pain is located in the periumbilical region and RLQ. The pain is at a severity of 10/10. Associated symptoms include anorexia, fever (Low grade), nausea and constipation. Pertinent negatives include dysuria and myalgias.    History reviewed. No pertinent past medical history.  There are no active problems to display for this patient.   History reviewed. No pertinent surgical history.  OB History    No data available       Home Medications    Prior to Admission medications   Not on File    Family History No family history on file.  Social History Social History  Substance Use Topics  . Smoking status: Never Smoker  . Smokeless tobacco: Never Used  . Alcohol use No     Allergies   Patient has no known allergies.   Review of Systems Review of Systems  Constitutional: Positive for appetite change and fever (Low grade). Negative for diaphoresis.  Respiratory: Negative for shortness of breath.   Cardiovascular: Negative for chest pain.  Gastrointestinal: Positive for abdominal distention, abdominal  pain, anorexia, constipation and nausea.  Genitourinary: Negative for dysuria and vaginal discharge.  Musculoskeletal: Negative for back pain and myalgias.  Neurological: Negative for weakness and light-headedness.     Physical Exam Updated Vital Signs BP 126/71 (BP Location: Right Arm)   Pulse 72   Temp 98.1 F (36.7 C) (Oral)   Resp (!) 22   LMP 11/22/2016   SpO2 99%   Physical Exam  Constitutional: She is oriented to person, place, and time. She appears well-developed and well-nourished.  HENT:  Head: Normocephalic.  Neck: Normal range of motion. Neck supple.  Cardiovascular: Normal rate and regular rhythm.   Pulmonary/Chest: Effort normal and breath sounds normal. She has no wheezes. She has no rales.  Abdominal: Soft. Bowel sounds are normal. She exhibits no mass. There is tenderness (Periumbilical, RLQ and right lateral abdominal tenderness to palpation. ). There is guarding.  Musculoskeletal: Normal range of motion.  Neurological: She is alert and oriented to person, place, and time.  Skin: Skin is warm and dry. No rash noted.  Psychiatric: She has a normal mood and affect.     ED Treatments / Results  Labs (all labs ordered are listed, but only abnormal results are displayed) Labs Reviewed  COMPREHENSIVE METABOLIC PANEL - Abnormal; Notable for the following:       Result Value   Glucose, Bld 104 (*)    Calcium 8.6 (*)    Total Protein 5.5 (*)    Alkaline Phosphatase 36 (*)    All other components within normal  limits  CBC - Abnormal; Notable for the following:    WBC 21.0 (*)    RBC 5.41 (*)    Hemoglobin 15.2 (*)    All other components within normal limits  URINALYSIS, ROUTINE W REFLEX MICROSCOPIC - Abnormal; Notable for the following:    APPearance HAZY (*)    All other components within normal limits  LIPASE, BLOOD  POC URINE PREG, ED   Results for orders placed or performed during the hospital encounter of 12/13/16  Lipase, blood  Result Value Ref  Range   Lipase 18 11 - 51 U/L  Comprehensive metabolic panel  Result Value Ref Range   Sodium 136 135 - 145 mmol/L   Potassium 4.3 3.5 - 5.1 mmol/L   Chloride 103 101 - 111 mmol/L   CO2 26 22 - 32 mmol/L   Glucose, Bld 104 (H) 65 - 99 mg/dL   BUN 11 6 - 20 mg/dL   Creatinine, Ser 4.09 0.44 - 1.00 mg/dL   Calcium 8.6 (L) 8.9 - 10.3 mg/dL   Total Protein 5.5 (L) 6.5 - 8.1 g/dL   Albumin 3.6 3.5 - 5.0 g/dL   AST 20 15 - 41 U/L   ALT 16 14 - 54 U/L   Alkaline Phosphatase 36 (L) 38 - 126 U/L   Total Bilirubin 0.4 0.3 - 1.2 mg/dL   GFR calc non Af Amer >60 >60 mL/min   GFR calc Af Amer >60 >60 mL/min   Anion gap 7 5 - 15  CBC  Result Value Ref Range   WBC 21.0 (H) 4.0 - 10.5 K/uL   RBC 5.41 (H) 3.87 - 5.11 MIL/uL   Hemoglobin 15.2 (H) 12.0 - 15.0 g/dL   HCT 81.1 91.4 - 78.2 %   MCV 83.2 78.0 - 100.0 fL   MCH 28.1 26.0 - 34.0 pg   MCHC 33.8 30.0 - 36.0 g/dL   RDW 95.6 21.3 - 08.6 %   Platelets 330 150 - 400 K/uL  Urinalysis, Routine w reflex microscopic  Result Value Ref Range   Color, Urine YELLOW YELLOW   APPearance HAZY (A) CLEAR   Specific Gravity, Urine 1.016 1.005 - 1.030   pH 6.0 5.0 - 8.0   Glucose, UA NEGATIVE NEGATIVE mg/dL   Hgb urine dipstick NEGATIVE NEGATIVE   Bilirubin Urine NEGATIVE NEGATIVE   Ketones, ur NEGATIVE NEGATIVE mg/dL   Protein, ur NEGATIVE NEGATIVE mg/dL   Nitrite NEGATIVE NEGATIVE   Leukocytes, UA NEGATIVE NEGATIVE  POC urine preg, ED  Result Value Ref Range   Preg Test, Ur NEGATIVE NEGATIVE    EKG  EKG Interpretation None       Radiology No results found.  Procedures Procedures (including critical care time)  Medications Ordered in ED Medications  HYDROmorphone (DILAUDID) injection 0.5 mg (not administered)  ondansetron (ZOFRAN) injection 4 mg (not administered)  0.9 %  sodium chloride infusion (not administered)     Initial Impression / Assessment and Plan / ED Course  I have reviewed the triage vital signs and the  nursing notes.  Pertinent labs & imaging results that were available during my care of the patient were reviewed by me and considered in my medical decision making (see chart for details).     Patient with significant abdominal pain described as progressive, migrating from periumbilical area to right side. There is guarding. VSS. She has a significant leukocytosis of 21. CT ordered to evaluate appendix. IV pain and nausea medications provided.  1:30 - CT shows "  tip appendicitis" without perforation. General surgery consulted. Patient declines need for further pain medication for now.   Discussed with Dr. Luisa Hart who will see patient in the ED for admission.  Final Clinical Impressions(s) / ED Diagnoses   Final diagnoses:  None   1. Acute appendicitis 2. Abdominal pain  New Prescriptions New Prescriptions   No medications on file     Elpidio Anis, Cordelia Poche 12/14/16 0548    Dione Booze, MD 12/14/16 [phone]

## 2016-12-14 NOTE — Anesthesia Preprocedure Evaluation (Addendum)
Anesthesia Evaluation  Patient identified by MRN, date of birth, ID band Patient awake    Reviewed: Allergy & Precautions, NPO status , Patient's Chart, lab work & pertinent test results, reviewed documented beta blocker date and time   Airway Mallampati: I  TM Distance: >3 FB Neck ROM: Full    Dental no notable dental hx. (+) Teeth Intact, Dental Advisory Given   Pulmonary neg pulmonary ROS,    Pulmonary exam normal breath sounds clear to auscultation       Cardiovascular hypertension, Pt. on home beta blockers Normal cardiovascular exam+ dysrhythmias Supra Ventricular Tachycardia  Rhythm:Regular Rate:Normal     Neuro/Psych negative neurological ROS  negative psych ROS   GI/Hepatic negative GI ROS, Neg liver ROS,   Endo/Other  negative endocrine ROS  Renal/GU negative Renal ROS  negative genitourinary   Musculoskeletal negative musculoskeletal ROS (+)   Abdominal Normal abdominal exam  (+)   Peds  Hematology negative hematology ROS (+)   Anesthesia Other Findings   Reproductive/Obstetrics negative OB ROS                           Anesthesia Physical Anesthesia Plan  ASA: II  Anesthesia Plan: General   Post-op Pain Management:    Induction: Intravenous  Airway Management Planned: Oral ETT  Additional Equipment:   Intra-op Plan:   Post-operative Plan: Extubation in OR  Informed Consent: I have reviewed the patients History and Physical, chart, labs and discussed the procedure including the risks, benefits and alternatives for the proposed anesthesia with the patient or authorized representative who has indicated his/her understanding and acceptance.   Dental advisory given  Plan Discussed with: CRNA, Surgeon and Anesthesiologist  Anesthesia Plan Comments:        Anesthesia Quick Evaluation

## 2016-12-15 ENCOUNTER — Encounter (HOSPITAL_COMMUNITY): Payer: Self-pay | Admitting: General Surgery

## 2016-12-15 MED ORDER — PANTOPRAZOLE SODIUM 40 MG PO TBEC: 40.0000 mg | DELAYED_RELEASE_TABLET | Freq: Every day | ORAL | Status: DC

## 2016-12-15 MED ORDER — IBUPROFEN 600 MG PO TABS
600.0000 mg | ORAL_TABLET | Freq: Four times a day (QID) | ORAL | Status: DC | PRN
  Administered 2016-12-15: 600 mg via ORAL
  Filled 2016-12-15: qty 1

## 2016-12-15 MED ORDER — HYDROCODONE-ACETAMINOPHEN 7.5-325 MG PO TABS
1.0000 | ORAL_TABLET | Freq: Four times a day (QID) | ORAL | Status: DC | PRN
  Administered 2016-12-15: 1 via ORAL
  Filled 2016-12-15: qty 1

## 2016-12-15 MED ORDER — ALBUTEROL SULFATE (2.5 MG/3ML) 0.083% IN NEBU
3.0000 mL | INHALATION_SOLUTION | RESPIRATORY_TRACT | Status: DC | PRN
Start: 2016-12-15 — End: 2016-12-15

## 2016-12-15 MED ORDER — ACETAMINOPHEN 325 MG PO TABS: ORAL_TABLET | ORAL | 2 refills | Status: DC

## 2016-12-15 MED ORDER — HYDROCODONE-ACETAMINOPHEN 7.5-325 MG PO TABS
1.0000 | ORAL_TABLET | Freq: Three times a day (TID) | ORAL | 0 refills | Status: DC | PRN
Start: 2016-12-15 — End: 2018-12-13

## 2016-12-15 MED ORDER — IBUPROFEN 200 MG PO TABS: ORAL_TABLET | ORAL | Status: DC

## 2016-12-15 MED ORDER — MUPIROCIN 2 % EX OINT
TOPICAL_OINTMENT | Freq: Two times a day (BID) | CUTANEOUS | Status: DC
  Administered 2016-12-15: 08:00:00 via NASAL
  Filled 2016-12-15: qty 22

## 2016-12-15 MED ORDER — KETOROLAC TROMETHAMINE 30 MG/ML IJ SOLN
30.0000 mg | Freq: Once | INTRAMUSCULAR | Status: AC
  Administered 2016-12-15: 30 mg via INTRAVENOUS
  Filled 2016-12-15: qty 1

## 2016-12-15 NOTE — Progress Notes (Signed)
Patient ambulating unit hall w/encouragement, tolerating well.

## 2016-12-15 NOTE — Progress Notes (Signed)
Physician Discharge Summary  Patient ID: Sheri Johnson MRN: [id number] DOB/AGE: Mar 18, 1986 31 y.o.  Admit date: 12/13/2016 Discharge date: 12/15/2016  Admission Diagnoses:  Acute appendicitis Hypertension Tachycardia   Discharge Diagnoses:  Same  Active Problems:   Acute appendicitis   Appendicitis   PROCEDURES: Laparoscopic appendectomy 12/14/16, Dr. Chevis Pretty III  Hospital Course:  Patient presents to the emergency room with a 3-day history of right lower quadrant abdominal pain. The pain started in her stomach repair upper abdominal region she states. It is now moved down to her right lower quadrant of her abdomen. It is constant. It is made worse with movement or pushing on the area. She's had some anorexia and nausea. No blood in her stool or diarrhea. He shows acute appendicitis without perforation. The pain is severe in nature as well as cramping. It is made better with pain medicine. Pt was admitted by Dr. Luisa Hart and taken to the OR the following afternoon by Dr. Carolynne Edouard.  Post op she is doing well, but pretty sore.  Her pain is better controlled now, she has had breakfast and lunch and is ready for discharge.     Condition on D/C:  Improved    CBC Latest Ref Rng & Units 12/13/2016  WBC 4.0 - 10.5 K/uL 21.0(H)  Hemoglobin 12.0 - 15.0 g/dL 15.2(H)  Hematocrit 36.0 - 46.0 % 45.0  Platelets 150 - 400 K/uL 330   CMP Latest Ref Rng & Units 12/13/2016  Glucose 65 - 99 mg/dL 119(J)  BUN 6 - 20 mg/dL 11  Creatinine 4.78 - 2.95 mg/dL 6.21  Sodium 308 - 657 mmol/L 136  Potassium 3.5 - 5.1 mmol/L 4.3  Chloride 101 - 111 mmol/L 103  CO2 22 - 32 mmol/L 26  Calcium 8.9 - 10.3 mg/dL 8.4(O)  Total Protein 6.5 - 8.1 g/dL 9.6(E)  Total Bilirubin 0.3 - 1.2 mg/dL 0.4  Alkaline Phos 38 - 126 U/L 36(L)  AST 15 - 41 U/L 20  ALT 14 - 54 U/L 16             Disposition: Final discharge disposition not confirmed   Allergies as of 12/15/2016   No Known Allergies     Medication  List    TAKE these medications   acetaminophen 325 MG tablet Commonly known as:  TYLENOL You can take 2 tablets every 6 hours for pain.  Your prescribed pain medicine also has Tylenol(acetaminophen) in it.  Count each one of those as a Tylenol tablet.  Do not take more than 4000 mg of Tylenol(acetaminophen) per day.   albuterol 108 (90 Base) MCG/ACT inhaler Commonly known as:  PROVENTIL HFA;VENTOLIN HFA Inhale 1-2 puffs into the lungs every 6 (six) hours as needed for wheezing or shortness of breath.   atenolol 25 MG tablet Commonly known as:  TENORMIN Take 25 mg by mouth daily.   cetirizine 10 MG tablet Commonly known as:  ZYRTEC Take 10 mg by mouth daily.   cyclobenzaprine 10 MG tablet Commonly known as:  FLEXERIL Take 5-10 mg by mouth 3 (three) times daily as needed for muscle spasms.   HYDROcodone-acetaminophen 7.5-325 MG tablet Commonly known as:  NORCO Take 1 tablet by mouth every 8 (eight) hours as needed for moderate pain or severe pain.   ibuprofen 200 MG tablet Commonly known as:  ADVIL,MOTRIN You can take 2-3 tablets every 6 hours as needed for pain.      Follow-up Information    [facility], [place]. Go on 01/09/2017.  Specialty:  General Surgery Why:  at 11:00 AM for post-operative follow up. please arrive 30 minutes early  Contact information: [address] [place] [id number] [phone]          Signed: Sherrie GeorgeJENNINGS,Rolla Kedzierski 12/15/2016, 11:40 AM

## 2016-12-15 NOTE — Progress Notes (Signed)
1 Day Post-Op  Chief Complaint/Subjective:  Abdominal pain She is still pretty sore, sites look fine.  Tolerating diet. She is still taking MS for pain;   Objective: Vital signs in last 24 hours: Temp:  [97.9 F (36.6 C)-98.5 F (36.9 C)] 98.3 F (36.8 C) (05/11 0536) Pulse Rate:  [78-99] 81 (05/11 0536) Resp:  [11-16] 16 (05/11 0536) BP: (106-126)/(59-74) 117/59 (05/11 0536) SpO2:  [97 %-100 %] 97 % (05/11 0536) Last BM Date: 12/13/16 Afebrile, VSS No labs 222 Po recorded 2600 IV  Urine 900  Intake/Output from previous day: 05/10 0701 - 05/11 0700 In: 2479.8 [P.O.:222; I.V.:2257.8] Out: 900 [Urine:900] Intake/Output this shift: No intake/output data recorded.  General appearance: alert, cooperative and no distress Resp: clear to auscultation bilaterally GI: soft, very sore and tender, sites look fine tolerating diet last PM  Lab Results:   Recent Labs  12/13/16 2158  WBC 21.0*  HGB 15.2*  HCT 45.0  PLT 330    BMET  Recent Labs  12/13/16 2158  NA 136  K 4.3  CL 103  CO2 26  GLUCOSE 104*  BUN 11  CREATININE 0.96  CALCIUM 8.6*   PT/INR No results for input(s): LABPROT, INR in the last 72 hours.   Recent Labs Lab 12/13/16 2158  AST 20  ALT 16  ALKPHOS 36*  BILITOT 0.4  PROT 5.5*  ALBUMIN 3.6     Lipase     Component Value Date/Time   LIPASE 18 12/13/2016 2158     Medications: . atenolol  25 mg Oral Daily  . heparin  5,000 Units Subcutaneous Q8H  . mupirocin ointment   Nasal BID  . pantoprazole (PROTONIX) IV  40 mg Intravenous QHS   . dextrose 5 % and 0.9 % NaCl with KCl 20 mEq/L 50 mL/hr at 12/14/16 1528  . lactated ringers 10 mL/hr at 12/14/16 [id number]   Anti-infectives    Start     Dose/Rate Route Frequency Ordered Stop   12/14/16 0600  cefTRIAXone (ROCEPHIN) 2 g in dextrose 5 % 50 mL IVPB  Status:  Discontinued     2 g 100 mL/hr over 30 Minutes Intravenous Every 24 hours 12/14/16 0455 12/14/16 0738   12/14/16 0600   metroNIDAZOLE (FLAGYL) IVPB 500 mg  Status:  Discontinued     500 mg 100 mL/hr over 60 Minutes Intravenous Every 8 hours 12/14/16 0455 12/14/16 0738      Assessment/Plan Acute appendicitis s/p laparoscopic appendectomy 12/14/16, Dr. Carolynne Johnson Hypertension Tachycardia FEN:  IV fluids/regular diet ID:  Pre op DVT:  SCD/Lovenox     Plan:  Mobilize, saline lock IV, stop abx, home later today when pain is better controlled by PO meds.     I have personally reviewed the patients medication history on the [place] controlled substance database.  LOS: 1 day    Sheri Johnson 12/15/2016 [phone]

## 2016-12-15 NOTE — Progress Notes (Signed)
Amion on call MD notified per patient request for increased medial abdominal pain (stabbing) and throbbing to right side abdomen. Also, patient requests PRN home albuterol for wheezing. Response pending.

## 2018-12-13 ENCOUNTER — Encounter (HOSPITAL_COMMUNITY): Payer: Self-pay

## 2018-12-13 ENCOUNTER — Emergency Department (HOSPITAL_COMMUNITY)
Admission: EM | Admit: 2018-12-13 | Discharge: 2018-12-13 | Disposition: A | Discharge status: Home / Self Care | Payer: 59 | Attending: Emergency Medicine | Admitting: Emergency Medicine

## 2018-12-13 ENCOUNTER — Other Ambulatory Visit: Payer: Self-pay

## 2018-12-13 DIAGNOSIS — H6011 Cellulitis of right external ear: Secondary | ICD-10-CM | POA: Insufficient documentation

## 2018-12-13 DIAGNOSIS — Z79899 Other long term (current) drug therapy: Secondary | ICD-10-CM | POA: Diagnosis not present

## 2018-12-13 DIAGNOSIS — R51 Headache: Secondary | ICD-10-CM | POA: Diagnosis present

## 2018-12-13 DIAGNOSIS — I10 Essential (primary) hypertension: Secondary | ICD-10-CM | POA: Insufficient documentation

## 2018-12-13 LAB — POC URINE PREG, ED: Preg Test, Ur: NEGATIVE

## 2018-12-13 MED ORDER — SULFAMETHOXAZOLE-TRIMETHOPRIM 800-160 MG PO TABS: 1.0000 | ORAL_TABLET | Freq: Two times a day (BID) | ORAL | 0 refills | Status: AC

## 2018-12-13 MED ORDER — ONDANSETRON HCL 4 MG PO TABS: 4.0000 mg | ORAL_TABLET | Freq: Four times a day (QID) | ORAL | 0 refills | Status: DC

## 2018-12-13 MED ORDER — ACETAMINOPHEN 325 MG PO TABS
650.0000 mg | ORAL_TABLET | Freq: Once | ORAL | Status: AC
  Administered 2018-12-13: 19:00:00 650 mg via ORAL
  Filled 2018-12-13: qty 2

## 2018-12-13 MED ORDER — TRAMADOL HCL 50 MG PO TABS: 50.0000 mg | ORAL_TABLET | Freq: Four times a day (QID) | ORAL | 0 refills | Status: DC | PRN

## 2018-12-13 MED ORDER — SULFAMETHOXAZOLE-TRIMETHOPRIM 800-160 MG PO TABS: 1.0000 | ORAL_TABLET | Freq: Two times a day (BID) | ORAL | 0 refills | Status: DC

## 2018-12-13 NOTE — ED Provider Notes (Signed)
[facility]-EMERGENCY DEPT Provider Note   CSN: [id number] Arrival date & time: 12/13/18  1757    History   Chief Complaint Chief Complaint  Patient presents with  . Headache  . Sinus Pressure    HPI Sheri Johnson is a 33 y.o. female.     The history is provided by the patient. No language interpreter was used.  Headache     33 year old female presenting for evaluation of right-sided headache.  Patient report for nearly 2 weeks she has had persistent pain primarily involving the right side of the face near her R ear. Pain is described as a sharp throbbing achy sensation, persistent, with subjective fever, heart palpitation, nausea, and as well as recent diarrhea.  She also report having pain to the right side of neck with enlarged lymph nodes.  She was seen by her PCP via telemedicine 5 days ago for her symptoms.  It was felt that she may have a sinus infection and patient was prescribed Augmentin.  She has been taking the medication for 5 days with no improvement.  She was seen at urgent care today with the same complaint and also reported occasional burning sensation to her midsternal with tingling sensation to both hands.  She was recommended to come to the ER for further evaluation.  Patient denies any recent sick contact with COVID positive patient.  She denies any associated cough, shortness of breath.  She denies any significant loss of hearing.  She does have history of thyroid disease currently taking medication for that.  She currently rates her pain a 7 out of 10.  Patient reports concern for potential sepsis.  Patient denies any significant family history of cardiac disease.  She is not a smoker or drinker.  She denies any exertional chest pain.  Past Medical History:  Diagnosis Date  . Hypertension     Patient Active Problem List   Diagnosis Date Noted  . Acute appendicitis 12/14/2016  . Appendicitis 12/14/2016    Past Surgical History:   Procedure Laterality Date  . LAPAROSCOPIC APPENDECTOMY N/A 12/14/2016   Procedure: APPENDECTOMY LAPAROSCOPIC;  Surgeon: Griselda Miner, MD;  Location: [facility] OR;  Service: General;  Laterality: N/A;     OB History   No obstetric history on file.      Home Medications    Prior to Admission medications   Medication Sig Start Date End Date Taking? Authorizing Provider  acetaminophen (TYLENOL) 325 MG tablet You can take 2 tablets every 6 hours for pain.  Your prescribed pain medicine also has Tylenol(acetaminophen) in it.  Count each one of those as a Tylenol tablet.  Do not take more than 4000 mg of Tylenol(acetaminophen) per day. 12/15/16   Sherrie George, PA-C  albuterol (PROVENTIL HFA;VENTOLIN HFA) 108 (90 Base) MCG/ACT inhaler Inhale 1-2 puffs into the lungs every 6 (six) hours as needed for wheezing or shortness of breath.    [provider]  atenolol (TENORMIN) 25 MG tablet Take 25 mg by mouth daily.    [provider]  cetirizine (ZYRTEC) 10 MG tablet Take 10 mg by mouth daily.    [provider]  cyclobenzaprine (FLEXERIL) 10 MG tablet Take 5-10 mg by mouth 3 (three) times daily as needed for muscle spasms.    [provider]  HYDROcodone-acetaminophen (NORCO) 7.5-325 MG tablet Take 1 tablet by mouth every 8 (eight) hours as needed for moderate pain or severe pain. 12/15/16   Sherrie George, PA-C  ibuprofen (ADVIL,MOTRIN) 200  MG tablet You can take 2-3 tablets every 6 hours as needed for pain. 12/15/16   Sherrie George, PA-C    Family History History reviewed. No pertinent family history.  Social History Social History   Tobacco Use  . Smoking status: Never Smoker  . Smokeless tobacco: Never Used  Substance Use Topics  . Alcohol use: No  . Drug use: Not on file     Allergies   Patient has no known allergies.   Review of Systems Review of Systems  Neurological: Positive for headaches.  All other systems reviewed and are negative.     Physical Exam Updated Vital Signs BP (!) 155/94 (BP Location: Left Arm)   Pulse 96   Temp 98.1 F (36.7 C) (Oral)   Resp 18   SpO2 98%   Physical Exam Vitals signs and nursing note reviewed.  Constitutional:      General: She is not in acute distress.    Appearance: She is well-developed.  HENT:     Head: Atraumatic.     Comments: Right earlobe is erythematous and tender to palpation but no abscess appreciated.  Right ear canal and right eardrums normal.  No evidence of mastoiditis.  Reactive lymph nodes noted to right anterior cervical chain.    Mouth/Throat:     Mouth: Mucous membranes are moist.     Pharynx: Oropharynx is clear.  Eyes:     Extraocular Movements: Extraocular movements intact.     Right eye: Normal extraocular motion.     Left eye: Normal extraocular motion.     Conjunctiva/sclera: Conjunctivae normal.     Pupils: Pupils are equal, round, and reactive to light.  Neck:     Musculoskeletal: Neck supple. No neck rigidity.  Cardiovascular:     Rate and Rhythm: Normal rate and regular rhythm.  Pulmonary:     Effort: Pulmonary effort is normal.     Breath sounds: Normal breath sounds.  Lymphadenopathy:     Cervical: Cervical adenopathy present.  Skin:    Findings: No rash.  Neurological:     Mental Status: She is alert.     GCS: GCS eye subscore is 4. GCS verbal subscore is 5. GCS motor subscore is 6.  Psychiatric:        Mood and Affect: Mood normal.      ED Treatments / Results  Labs (all labs ordered are listed, but only abnormal results are displayed) Labs Reviewed  POC URINE PREG, ED    EKG None  Radiology No results found.  Procedures Procedures (including critical care time)  Medications Ordered in ED Medications - No data to display   Initial Impression / Assessment and Plan / ED Course  I have reviewed the triage vital signs and the nursing notes.  Pertinent labs & imaging results that were available during my care of  the patient were reviewed by me and considered in my medical decision making (see chart for details).        BP (!) 155/94 (BP Location: Left Arm)   Pulse 96   Temp 98.1 F (36.7 C) (Oral)   Resp 18   Ht  (1.753 m)   Wt 104.3 kg   SpO2 98%   BMI 33.97 kg/m    Final Clinical Impressions(s) / ED Diagnoses   Final diagnoses:  Cellulitis of right ear    ED Discharge Orders    None     6:47 PM Patient here with complaints of pain to the right  side of her face around her right ear.  On exam she has an erythematous right earlobe consistent with cellulitis.  No evidence of abscess.  No evidence of otitis media and no evidence to suggest mastoiditis.  She is afebrile, vital signs stable, symptoms not consistent with sepsis.  Patient will benefit from changing her antibiotic from Augmentin to Bactrim. Pt d/c home with pain medication, abx and outpt f/u.  Return precaution given.    Fayrene Helperran, Sholonda Jobst, PA-C 12/13/18 1919    Raeford RazorKohut, Stephen, MD 12/13/18 2130

## 2018-12-13 NOTE — Discharge Instructions (Signed)
You have been diagnosed with cellulitis involving your right ear.  Please discontinue Augmentin and take Bactrim as prescribed.  Take zofran as needed for nausea, and take tramadol as needed for pain.  Follow up with your doctor for further care.

## 2018-12-13 NOTE — ED Triage Notes (Signed)
Pt reports sinus pressure and R sided headache with some R sided lymphadenopathy. Her PCP provided her with antibiotics for a sinus infection that she has been taking for 2 days. Symptoms have not improved. Denies cough, SOB, or consistent fever.

## 2020-08-13 ENCOUNTER — Other Ambulatory Visit: Payer: Managed Care, Other (non HMO)

## 2020-08-31 ENCOUNTER — Other Ambulatory Visit: Payer: Managed Care, Other (non HMO)

## 2020-08-31 DIAGNOSIS — Z20822 Contact with and (suspected) exposure to covid-19: Secondary | ICD-10-CM

## 2020-09-02 LAB — NOVEL CORONAVIRUS, NAA: SARS-CoV-2, NAA: DETECTED — AB

## 2020-09-02 LAB — SARS-COV-2, NAA 2 DAY TAT

## 2020-09-03 ENCOUNTER — Telehealth (HOSPITAL_COMMUNITY): Payer: Self-pay | Admitting: Family

## 2020-09-03 NOTE — Telephone Encounter (Signed)
Called to discuss with Felipe Drone about Covid symptoms and the use of casirivimab/imdevimab, a combination monoclonal antibody infusion for those with mild to moderate Covid symptoms and at a high risk of hospitalization.     Pt is qualified for this infusion at the infusion center due to co-morbid conditions and/or a member of an at-risk group, however declines infusion at this time as she states she is feeling better. Symptoms tier reviewed as well as criteria for ending isolation.  Symptoms reviewed that would warrant ED/Hospital evaluation. Preventative practices reviewed. Patient verbalized understanding.      Patient Active Problem List   Diagnosis Date Noted  . Acute appendicitis 12/14/2016  . Appendicitis 12/14/2016    Dedee Liss,NP

## 2020-09-06 ENCOUNTER — Other Ambulatory Visit: Payer: Managed Care, Other (non HMO)

## 2021-01-08 ENCOUNTER — Emergency Department (HOSPITAL_COMMUNITY): Payer: 59

## 2021-01-08 ENCOUNTER — Emergency Department (HOSPITAL_COMMUNITY)
Admission: EM | Admit: 2021-01-08 | Discharge: 2021-01-08 | Disposition: A | Discharge status: Home / Self Care | Payer: 59 | Attending: Emergency Medicine | Admitting: Emergency Medicine

## 2021-01-08 ENCOUNTER — Other Ambulatory Visit: Payer: Self-pay

## 2021-01-08 ENCOUNTER — Encounter (HOSPITAL_COMMUNITY): Payer: Self-pay

## 2021-01-08 DIAGNOSIS — I10 Essential (primary) hypertension: Secondary | ICD-10-CM | POA: Diagnosis not present

## 2021-01-08 DIAGNOSIS — R000 Tachycardia, unspecified: Secondary | ICD-10-CM | POA: Diagnosis present

## 2021-01-08 DIAGNOSIS — Z79899 Other long term (current) drug therapy: Secondary | ICD-10-CM | POA: Diagnosis not present

## 2021-01-08 DIAGNOSIS — I471 Supraventricular tachycardia: Secondary | ICD-10-CM | POA: Diagnosis not present

## 2021-01-08 LAB — BASIC METABOLIC PANEL
Anion gap: 8 (ref 5–15)
BUN: 10 mg/dL (ref 6–20)
CO2: 25 mmol/L (ref 22–32)
Calcium: 9.3 mg/dL (ref 8.9–10.3)
Chloride: 105 mmol/L (ref 98–111)
Creatinine, Ser: 1.1 mg/dL — ABNORMAL HIGH (ref 0.44–1.00)
GFR, Estimated: >60 mL/min (ref >60)
Glucose, Bld: 103 mg/dL — ABNORMAL HIGH (ref 70–99)
Potassium: 3.3 mmol/L — ABNORMAL LOW (ref 3.5–5.1)
Sodium: 138 mmol/L (ref 135–145)

## 2021-01-08 LAB — CBC WITH DIFFERENTIAL/PLATELET
Abs Immature Granulocytes: 0.06 10*3/uL (ref 0.00–0.07)
Basophils Absolute: 0 10*3/uL (ref 0.0–0.1)
Basophils Relative: 0 %
Eosinophils Absolute: 0.2 10*3/uL (ref 0.0–0.5)
Eosinophils Relative: 1 %
HCT: 39.7 % (ref 36.0–46.0)
Hemoglobin: 13.4 g/dL (ref 12.0–15.0)
Immature Granulocytes: 0 %
Lymphocytes Relative: 13 %
Lymphs Abs: 1.9 10*3/uL (ref 0.7–4.0)
MCH: 28.4 pg (ref 26.0–34.0)
MCHC: 33.8 g/dL (ref 30.0–36.0)
MCV: 84.1 fL (ref 80.0–100.0)
Monocytes Absolute: 1 10*3/uL (ref 0.1–1.0)
Monocytes Relative: 7 %
Neutro Abs: 11.8 10*3/uL — ABNORMAL HIGH (ref 1.7–7.7)
Neutrophils Relative %: 79 %
Platelets: 275 10*3/uL (ref 150–400)
RBC: 4.72 MIL/uL (ref 3.87–5.11)
RDW: 12.7 % (ref 11.5–15.5)
WBC: 14.9 10*3/uL — ABNORMAL HIGH (ref 4.0–10.5)
nRBC: 0 % (ref 0.0–0.2)

## 2021-01-08 LAB — I-STAT BETA HCG BLOOD, ED (MC, WL, AP ONLY): I-stat hCG, quantitative: <5 m[IU]/mL (ref <5)

## 2021-01-08 MED ORDER — SODIUM CHLORIDE 0.9 % IV BOLUS
1000.0000 mL | Freq: Once | INTRAVENOUS | Status: AC
  Administered 2021-01-08: 1000 mL via INTRAVENOUS

## 2021-01-08 MED ORDER — METOPROLOL TARTRATE 5 MG/5ML IV SOLN
5.0000 mg | Freq: Once | INTRAVENOUS | Status: AC
  Administered 2021-01-08: 5 mg via INTRAVENOUS
  Filled 2021-01-08: qty 5

## 2021-01-08 MED ORDER — POTASSIUM CHLORIDE CRYS ER 20 MEQ PO TBCR
40.0000 meq | EXTENDED_RELEASE_TABLET | Freq: Once | ORAL | Status: AC
  Administered 2021-01-08: 40 meq via ORAL
  Filled 2021-01-08: qty 2

## 2021-01-08 MED ORDER — ACETAMINOPHEN 500 MG PO TABS
1000.0000 mg | ORAL_TABLET | Freq: Once | ORAL | Status: AC
  Administered 2021-01-08: 1000 mg via ORAL
  Filled 2021-01-08: qty 2

## 2021-01-08 NOTE — ED Triage Notes (Signed)
Patient from home with [facility], reports last night she wasn't feeling, sore throat, high fever, back pain, started on Septa for UTI this morning, states she went to get some dinner and had sudden onset palpitations, EMS found her in SVT to 220, Adenosine 6mg  x 1 and 1 mg x 1 given, now in ST 110-120.

## 2021-01-08 NOTE — ED Provider Notes (Signed)
[facility] EMERGENCY DEPARTMENT Provider Note   CSN: [id number] Arrival date & time: 01/08/21  1911     History Chief Complaint  Patient presents with  . Tachycardia    Fontaine Kossman is a 35 y.o. female.  35 year old female with prior medical history as detailed below presents for evaluation.  Patient reports palpitations.  EMS was called.  EMS reports the patient was in SVT with a rate around 220.  This was improved after administration of adenosine (6mg  and then 12mg ).  Patient is without associated chest pain or shortness of breath.  She did not pass out.  She already takes atenolol for treatment of hypertension and "fast heart rates."  Denies prior history of SVT.  Additionally, patient just started taking Bactrim earlier today for treatment of UTI.  The history is provided by the patient and medical records.  Palpitations Palpitations quality:  Fast Onset quality:  Sudden Duration:  2 hours Timing:  Constant Progression:  Improving Chronicity:  New Relieved by:  Nothing      Past Medical History:  Diagnosis Date  . Hypertension     Patient Active Problem List   Diagnosis Date Noted  . Acute appendicitis 12/14/2016  . Appendicitis 12/14/2016    Past Surgical History:  Procedure Laterality Date  . LAPAROSCOPIC APPENDECTOMY N/A 12/14/2016   Procedure: APPENDECTOMY LAPAROSCOPIC;  Surgeon: 02/13/2017, MD;  Location: [facility];  Service: General;  Laterality: N/A;     OB History   No obstetric history on file.     History reviewed. No pertinent family history.  Social History   Tobacco Use  . Smoking status: Never Smoker  . Smokeless tobacco: Never Used  Substance Use Topics  . Alcohol use: No    Home Medications Prior to Admission medications   Medication Sig Start Date End Date Taking? Authorizing Provider  acetaminophen (TYLENOL) 325 MG tablet You can take 2 tablets every 6 hours for pain.  Your prescribed pain medicine also  has Tylenol(acetaminophen) in it.  Count each one of those as a Tylenol tablet.  Do not take more than 4000 mg of Tylenol(acetaminophen) per day. 12/15/16   [facility], PA-C  albuterol (PROVENTIL HFA;VENTOLIN HFA) 108 (90 Base) MCG/ACT inhaler Inhale 1-2 puffs into the lungs every 6 (six) hours as needed for wheezing or shortness of breath.    [provider]  atenolol (TENORMIN) 25 MG tablet Take 25 mg by mouth daily.    [provider]  cetirizine (ZYRTEC) 10 MG tablet Take 10 mg by mouth daily.    [provider]  cyclobenzaprine (FLEXERIL) 10 MG tablet Take 5-10 mg by mouth 3 (three) times daily as needed for muscle spasms.    [provider]  ibuprofen (ADVIL,MOTRIN) 200 MG tablet You can take 2-3 tablets every 6 hours as needed for pain. 12/15/16   Sherrie George, PA-C  ondansetron (ZOFRAN) 4 MG tablet Take 1 tablet (4 mg total) by mouth every 6 (six) hours. 12/13/18   Sherrie George, PA-C  traMADol (ULTRAM) 50 MG tablet Take 1 tablet (50 mg total) by mouth every 6 (six) hours as needed. 12/13/18   Fayrene Helper, PA-C    Allergies    Cipro [ciprofloxacin hcl]  Review of Systems   Review of Systems  Cardiovascular: Positive for palpitations.  All other systems reviewed and are negative.   Physical Exam Updated Vital Signs BP (!) 145/89   Pulse (!) 129   Temp 98.1 F (36.7 C) (Oral)  Resp 18   Ht 5\' 9"  (1.753 m)   Wt 104.3 kg   LMP 01/01/2021   SpO2 98%   BMI 33.97 kg/m   Physical Exam Vitals and nursing note reviewed.  Constitutional:      General: She is not in acute distress.    Appearance: She is well-developed.  HENT:     Head: Normocephalic and atraumatic.  Eyes:     Conjunctiva/sclera: Conjunctivae normal.     Pupils: Pupils are equal, round, and reactive to light.  Cardiovascular:     Rate and Rhythm: Regular rhythm. Tachycardia present.     Heart sounds: Normal heart sounds.  Pulmonary:     Effort: Pulmonary effort is  normal. No respiratory distress.     Breath sounds: Normal breath sounds.  Abdominal:     General: There is no distension.     Palpations: Abdomen is soft.     Tenderness: There is no abdominal tenderness.  Musculoskeletal:        General: No deformity. Normal range of motion.     Cervical back: Normal range of motion and neck supple.  Skin:    General: Skin is warm and dry.  Neurological:     Mental Status: She is alert and oriented to person, place, and time.     ED Results / Procedures / Treatments   Labs (all labs ordered are listed, but only abnormal results are displayed) Labs Reviewed  BASIC METABOLIC PANEL - Abnormal; Notable for the following components:      Result Value   Potassium 3.3 (*)    Glucose, Bld 103 (*)    Creatinine, Ser 1.10 (*)    All other components within normal limits  CBC WITH DIFFERENTIAL/PLATELET - Abnormal; Notable for the following components:   WBC 14.9 (*)    Neutro Abs 11.8 (*)    All other components within normal limits  I-STAT BETA HCG BLOOD, ED ([facility], [facility], [facility] ONLY)    EKG EKG Interpretation  Date/Time:  Saturday January 08 2021 19:46:48 EDT Ventricular Rate:  123 PR Interval:  142 QRS Duration: 90 QT Interval:  347 QTC Calculation: 497 R Axis:   43 Text Interpretation: Sinus tachycardia Borderline T abnormalities, inferior leads Borderline prolonged QT interval Confirmed by [id number]) on 01/08/2021 7:49:56 PM   Radiology No results found.  Procedures Procedures   Medications Ordered in ED Medications  sodium chloride 0.9 % bolus 1,000 mL (1,000 mLs Intravenous New Bag/Given 01/08/21 2002)  metoprolol tartrate (LOPRESSOR) injection 5 mg (5 mg Intravenous Given 01/08/21 2001)  potassium chloride SA (KLOR-CON) CR tablet 40 mEq (40 mEq Oral Given 01/08/21 2057)  acetaminophen (TYLENOL) tablet 1,000 mg (1,000 mg Oral Given 01/08/21 2058)    ED Course  I have reviewed the triage vital signs and the nursing notes.  Pertinent  labs & imaging results that were available during my care of the patient were reviewed by me and considered in my medical decision making (see chart for details).    MDM Rules/Calculators/A&P                          MDM   MSE complete  Cherl Gorney was evaluated in Emergency Department on 01/08/2021 for the symptoms described in the history of present illness. She was evaluated in the context of the global COVID-19 pandemic, which necessitated consideration that the patient might be at risk for infection with the SARS-CoV-2 virus that causes COVID-19.  Institutional protocols and algorithms that pertain to the evaluation of patients at risk for COVID-19 are in a state of rapid change based on information released by regulatory bodies including the CDC and federal and state organizations. These policies and algorithms were followed during the patient's care in the ED.  Patient presents for evaluation of likely SVT.  Adenosine given by EMS with improvement of her symptoms.  Initial heart rate in the 200-220 range with EMS.  After adenosine patient returned to the normal sinus rhythm with a heart rate in the 115-120 range.   Screening labs revealed mild hypokalemia.  This was treated.  Patient is appropriate for outpatient management.  Importance of close follow-up is stressed.  Strict return precautions given and understood.  Patient is advised to continue her atenolol as previously prescribed.   Final Clinical Impression(s) / ED Diagnoses Final diagnoses:  SVT (supraventricular tachycardia) (HCC)    Rx / DC Orders ED Discharge Orders    None       Wynetta Fines, MD 01/08/21 2104

## 2021-01-08 NOTE — Discharge Instructions (Addendum)
Return for any problem.  ?

## 2021-01-18 NOTE — Progress Notes (Signed)
Referring-Sheri Messick MD Reason for referral-SVT  HPI: 35 yo female for evaluation of SVT at request of Sheri Royal MD. Had covid 1/22. TSH 12/30/20 4.51. Seen with palpitations 01/08/21; was in SVT (ECG with SVT 231 with diffuse ST depression); EMS terminated arrhythmia with adenosine. Chest xray negative; WBC 14.9, Hgb 13.4, K 3.3. Cardiology asked to evaluate.  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  However on the day of her SVT she noticed the sudden onset of heart racing.  There was weakness and dizziness and some chest pressure.  She did not have syncope.  She did convert with adenosine as described above.  Since that time she has felt like she has been in a "fog".  There has been some swelling in her neck and occipital area, generalized weakness.  Prior to her event she exercises routinely denies dyspnea, chest pain or syncope.  Current Outpatient Medications  Medication Sig Dispense Refill   acetaminophen (TYLENOL) 325 MG tablet You can take 2 tablets every 6 hours for pain.  Your prescribed pain medicine also has Tylenol(acetaminophen) in it.  Count each one of those as a Tylenol tablet.  Do not take more than 4000 mg of Tylenol(acetaminophen) per day. 100 tablet 2   albuterol (PROVENTIL HFA;VENTOLIN HFA) 108 (90 Base) MCG/ACT inhaler Inhale 1-2 puffs into the lungs every 6 (six) hours as needed for wheezing or shortness of breath.     atenolol (TENORMIN) 25 MG tablet Take 50 mg by mouth daily.     busPIRone (BUSPAR) 15 MG tablet Take 15 mg by mouth in the morning and at bedtime.     cetirizine (ZYRTEC) 10 MG tablet Take 10 mg by mouth daily.     cyclobenzaprine (FLEXERIL) 10 MG tablet Take 5-10 mg by mouth 3 (three) times daily as needed for muscle spasms.     ibuprofen (ADVIL,MOTRIN) 200 MG tablet You can take 2-3 tablets every 6 hours as needed for pain.     levothyroxine (SYNTHROID) 88 MCG tablet Take 88 mcg by mouth  daily.     No current facility-administered medications for this visit.    Allergies  Allergen Reactions   Cipro [Ciprofloxacin Hcl] Palpitations     Past Medical History:  Diagnosis Date   Anxiety    Asthma    Hypertension    Hypothyroid    SVT (supraventricular tachycardia) (HCC)     Past Surgical History:  Procedure Laterality Date   LAPAROSCOPIC APPENDECTOMY N/A 12/14/2016   Procedure: APPENDECTOMY LAPAROSCOPIC;  Surgeon: Griselda Miner, MD;  Location: [facility] OR;  Service: General;  Laterality: N/A;    Social History   Socioeconomic History   Marital status: Single    Spouse name: Not on file   Number of children: 3   Years of education: Not on file   Highest education level: Not on file  Occupational History   Not on file  Tobacco Use   Smoking status: Never   Smokeless tobacco: Never  Substance and Sexual Activity   Alcohol use: Yes    Comment: Rare   Drug use: Not on file   Sexual activity: Not on file  Other Topics Concern   Not on file  Social History Narrative   Not on file   Social Determinants of Health   Financial Resource Strain: Not on file  Food Insecurity: Not on file  Transportation Needs: Not on file  Physical Activity: Not on file  Stress: Not on file  Social Connections: Not on file  Intimate Partner Violence: Not on file    Family History  Problem Relation Age of Onset   Hypothyroidism Mother     ROS: no fevers or chills, productive cough, hemoptysis, dysphasia, odynophagia, melena, hematochezia, dysuria, hematuria, rash, seizure activity, orthopnea, PND, pedal edema, claudication. Remaining systems are negative.  Physical Exam:   Blood pressure 126/84, pulse 89, height 5\' 9"  (1.753 m), weight 237 lb 6.4 oz (107.7 kg), last menstrual period 01/01/2021, SpO2 97 %.  General:  Well developed/well nourished in NAD Skin warm/dry Patient not depressed No peripheral clubbing Back-normal HEENT-normal/normal eyelids Neck  supple/normal carotid upstroke bilaterally; no bruits; no JVD; no thyromegaly chest - CTA/ normal expansion CV - RRR/normal S1 and S2; no murmurs, rubs or gallops;  PMI nondisplaced Abdomen -NT/ND, no HSM, no mass, + bowel sounds, no bruit 2+ femoral pulses, no bruits Ext-no edema, chords, 2+ DP Neuro-grossly nonfocal  ECG - 01/08/21; sinus tachycardia, LAE, prolonged QT; personally reviewed  A/P  1 SVT-patient was noted to have SVT at time of recent event.  She is in sinus rhythm on examination today.  I will arrange an echocardiogram to assess LV function.  Increase atenolol to 75 mg daily.  We discussed options of medical therapy versus ablation and she would like to be conservative at this point if possible.  I discussed the potential for Valsalva to break her SVT if it recurs.  If she has more frequent episodes or prolonged episodes in the future we can consider referral to electrophysiology for ablation.  2 hypertension-we will continue atenolol for blood pressure control.  03/10/21, MD

## 2021-01-19 ENCOUNTER — Encounter: Payer: Self-pay | Admitting: Cardiology

## 2021-01-19 ENCOUNTER — Other Ambulatory Visit: Payer: Self-pay

## 2021-01-19 ENCOUNTER — Ambulatory Visit: Payer: Managed Care, Other (non HMO) | Admitting: Cardiology

## 2021-01-19 VITALS — BP 126/84 | HR 89 | Ht 69.0 in | Wt 237.4 lb

## 2021-01-19 DIAGNOSIS — I471 Supraventricular tachycardia, unspecified: Secondary | ICD-10-CM

## 2021-01-19 DIAGNOSIS — I10 Essential (primary) hypertension: Secondary | ICD-10-CM | POA: Diagnosis not present

## 2021-01-19 MED ORDER — ATENOLOL 50 MG PO TABS: 75.0000 mg | ORAL_TABLET | Freq: Every day | ORAL | 3 refills | Status: DC

## 2021-01-19 NOTE — Patient Instructions (Signed)
Medication Instructions:   INCREASE ATENOLOL TO 75 MG ONCE DAILY= 1 AND 1/2 OF THE 50 MG TABLETS ONCE DAILY  *If you need a refill on your cardiac medications before your next appointment, please call your pharmacy*   Testing/Procedures:  Your physician has requested that you have an echocardiogram. Echocardiography is a painless test that uses sound waves to create images of your heart. It provides your doctor with information about the size and shape of your heart and how well your heart's chambers and valves are working. This procedure takes approximately one hour. There are no restrictions for this procedure. [address]-[place]   Follow-Up: At [facility], you and your health needs are our priority.  As part of our continuing mission to provide you with exceptional heart care, we have created designated Provider Care Teams.  These Care Teams include your primary Cardiologist (physician) and Advanced Practice Providers (APPs -  Physician Assistants and Nurse Practitioners) who all work together to provide you with the care you need, when you need it.  We recommend signing up for the patient portal called "MyChart".  Sign up information is provided on this After Visit Summary.  MyChart is used to connect with patients for Virtual Visits (Telemedicine).  Patients are able to view lab/test results, encounter notes, upcoming appointments, etc.  Non-urgent messages can be sent to your provider as well.   To learn more about what you can do with MyChart, go to [email or web address].    Your next appointment:   4 month(s)  The format for your next appointment:   In Person  Provider:   Olga Millers, MD

## 2021-02-04 ENCOUNTER — Telehealth: Payer: Self-pay | Admitting: Cardiology

## 2021-02-04 NOTE — Telephone Encounter (Signed)
Pt is calling stating she needs to reschedule her Echo due to still getting her insurance sorted out. Pt was scheduled in Echo room 3, schedulers are unable to schedule in this room and Thea Alken is out of office until 02/09/21. Please call pt to arrange Echo at the end of July if possible. Please advise.

## 2021-02-04 NOTE — Telephone Encounter (Signed)
Scheduler's are unable to schedule/reschedule echo's in Room 3. Can you reschedule this patient, thank you!

## 2021-02-08 ENCOUNTER — Other Ambulatory Visit (HOSPITAL_COMMUNITY): Payer: Managed Care, Other (non HMO)

## 2021-02-15 ENCOUNTER — Other Ambulatory Visit: Payer: Self-pay

## 2021-02-15 ENCOUNTER — Ambulatory Visit (HOSPITAL_COMMUNITY): Payer: Managed Care, Other (non HMO) | Attending: Cardiovascular Disease

## 2021-02-15 DIAGNOSIS — I471 Supraventricular tachycardia: Secondary | ICD-10-CM

## 2021-02-16 LAB — ECHOCARDIOGRAM COMPLETE
Area-P 1/2: 3.08 cm2
S' Lateral: 2.7 cm

## 2021-02-23 ENCOUNTER — Ambulatory Visit: Payer: Managed Care, Other (non HMO) | Admitting: Cardiology

## 2021-03-02 ENCOUNTER — Other Ambulatory Visit (HOSPITAL_COMMUNITY): Payer: Managed Care, Other (non HMO)

## 2021-05-03 NOTE — Progress Notes (Deleted)
HPI: FU SVT. Seen with palpitations 01/08/21; was in SVT (ECG with SVT 231 with diffuse ST depression); EMS terminated arrhythmia with adenosine.  Echocardiogram July 2022 showed normal LV function, mild left atrial enlargement.  Since last seen  Current Outpatient Medications  Medication Sig Dispense Refill   acetaminophen (TYLENOL) 325 MG tablet You can take 2 tablets every 6 hours for pain.  Your prescribed pain medicine also has Tylenol(acetaminophen) in it.  Count each one of those as a Tylenol tablet.  Do not take more than 4000 mg of Tylenol(acetaminophen) per day. 100 tablet 2   albuterol (PROVENTIL HFA;VENTOLIN HFA) 108 (90 Base) MCG/ACT inhaler Inhale 1-2 puffs into the lungs every 6 (six) hours as needed for wheezing or shortness of breath.     atenolol (TENORMIN) 50 MG tablet Take 1.5 tablets (75 mg total) by mouth daily. 135 tablet 3   busPIRone (BUSPAR) 15 MG tablet Take 15 mg by mouth in the morning and at bedtime.     cetirizine (ZYRTEC) 10 MG tablet Take 10 mg by mouth daily.     cyclobenzaprine (FLEXERIL) 10 MG tablet Take 5-10 mg by mouth 3 (three) times daily as needed for muscle spasms.     ibuprofen (ADVIL,MOTRIN) 200 MG tablet You can take 2-3 tablets every 6 hours as needed for pain.     levothyroxine (SYNTHROID) 88 MCG tablet Take 88 mcg by mouth daily.     No current facility-administered medications for this visit.     Past Medical History:  Diagnosis Date   Anxiety    Asthma    Hypertension    Hypothyroid    SVT (supraventricular tachycardia) (HCC)     Past Surgical History:  Procedure Laterality Date   LAPAROSCOPIC APPENDECTOMY N/A 12/14/2016   Procedure: APPENDECTOMY LAPAROSCOPIC;  Surgeon: Griselda Miner, MD;  Location: [facility] OR;  Service: General;  Laterality: N/A;    Social History   Socioeconomic History   Marital status: Single    Spouse name: Not on file   Number of children: 3   Years of education: Not on file   Highest education level:  Not on file  Occupational History   Not on file  Tobacco Use   Smoking status: Never   Smokeless tobacco: Never  Substance and Sexual Activity   Alcohol use: Yes    Comment: Rare   Drug use: Not on file   Sexual activity: Not on file  Other Topics Concern   Not on file  Social History Narrative   Not on file   Social Determinants of Health   Financial Resource Strain: Not on file  Food Insecurity: Not on file  Transportation Needs: Not on file  Physical Activity: Not on file  Stress: Not on file  Social Connections: Not on file  Intimate Partner Violence: Not on file    Family History  Problem Relation Age of Onset   Hypothyroidism Mother     ROS: no fevers or chills, productive cough, hemoptysis, dysphasia, odynophagia, melena, hematochezia, dysuria, hematuria, rash, seizure activity, orthopnea, PND, pedal edema, claudication. Remaining systems are negative.  Physical Exam: Well-developed well-nourished in no acute distress.  Skin is warm and dry.  HEENT is normal.  Neck is supple.  Chest is clear to auscultation with normal expansion.  Cardiovascular exam is regular rate and rhythm.  Abdominal exam nontender or distended. No masses palpated. Extremities show no edema. neuro grossly intact  ECG- personally reviewed  A/P  1 supraventricular  tachycardia-we will continue atenolol at present dose.  She has had no recurrences.  She would like to continue medical therapy at this point.  Can consider referral for ablation in the future if she has more frequent or prolonged episodes.  2 hypertension-blood pressure controlled.  Continue atenolol at present dose.  Olga Millers, MD

## 2021-05-11 ENCOUNTER — Ambulatory Visit: Payer: Managed Care, Other (non HMO) | Admitting: Cardiology

## 2021-09-29 ENCOUNTER — Emergency Department (HOSPITAL_COMMUNITY)
Admission: EM | Admit: 2021-09-29 | Discharge: 2021-09-30 | Disposition: A | Discharge status: Home / Self Care | Payer: 59 | Attending: Emergency Medicine | Admitting: Emergency Medicine

## 2021-09-29 ENCOUNTER — Emergency Department (HOSPITAL_COMMUNITY): Payer: 59

## 2021-09-29 ENCOUNTER — Other Ambulatory Visit: Payer: Self-pay

## 2021-09-29 DIAGNOSIS — F419 Anxiety disorder, unspecified: Secondary | ICD-10-CM | POA: Insufficient documentation

## 2021-09-29 DIAGNOSIS — E876 Hypokalemia: Secondary | ICD-10-CM | POA: Diagnosis not present

## 2021-09-29 DIAGNOSIS — H538 Other visual disturbances: Secondary | ICD-10-CM | POA: Diagnosis not present

## 2021-09-29 DIAGNOSIS — R002 Palpitations: Secondary | ICD-10-CM | POA: Diagnosis present

## 2021-09-29 DIAGNOSIS — Z79899 Other long term (current) drug therapy: Secondary | ICD-10-CM | POA: Insufficient documentation

## 2021-09-29 DIAGNOSIS — I471 Supraventricular tachycardia: Secondary | ICD-10-CM | POA: Diagnosis not present

## 2021-09-29 DIAGNOSIS — I10 Essential (primary) hypertension: Secondary | ICD-10-CM | POA: Diagnosis not present

## 2021-09-29 LAB — CBC WITH DIFFERENTIAL/PLATELET
Abs Immature Granulocytes: 0.03 10*3/uL (ref 0.00–0.07)
Basophils Absolute: 0 10*3/uL (ref 0.0–0.1)
Basophils Relative: 0 %
Eosinophils Absolute: 0.4 10*3/uL (ref 0.0–0.5)
Eosinophils Relative: 4 %
HCT: 38.4 % (ref 36.0–46.0)
Hemoglobin: 12.8 g/dL (ref 12.0–15.0)
Immature Granulocytes: 0 %
Lymphocytes Relative: 32 %
Lymphs Abs: 3 10*3/uL (ref 0.7–4.0)
MCH: 27.8 pg (ref 26.0–34.0)
MCHC: 33.3 g/dL (ref 30.0–36.0)
MCV: 83.5 fL (ref 80.0–100.0)
Monocytes Absolute: 0.7 10*3/uL (ref 0.1–1.0)
Monocytes Relative: 8 %
Neutro Abs: 5.3 10*3/uL (ref 1.7–7.7)
Neutrophils Relative %: 56 %
Platelets: 279 10*3/uL (ref 150–400)
RBC: 4.6 MIL/uL (ref 3.87–5.11)
RDW: 13 % (ref 11.5–15.5)
WBC: 9.5 10*3/uL (ref 4.0–10.5)
nRBC: 0 % (ref 0.0–0.2)

## 2021-09-29 LAB — BASIC METABOLIC PANEL
Anion gap: 9 (ref 5–15)
BUN: 9 mg/dL (ref 6–20)
CO2: 23 mmol/L (ref 22–32)
Calcium: 8.6 mg/dL — ABNORMAL LOW (ref 8.9–10.3)
Chloride: 104 mmol/L (ref 98–111)
Creatinine, Ser: 1.17 mg/dL — ABNORMAL HIGH (ref 0.44–1.00)
GFR, Estimated: >60 mL/min (ref >60)
Glucose, Bld: 165 mg/dL — ABNORMAL HIGH (ref 70–99)
Potassium: 3.2 mmol/L — ABNORMAL LOW (ref 3.5–5.1)
Sodium: 136 mmol/L (ref 135–145)

## 2021-09-29 LAB — D-DIMER, QUANTITATIVE: D-Dimer, Quant: 0.36 ug/mL-FEU (ref 0.00–0.50)

## 2021-09-29 LAB — TROPONIN I (HIGH SENSITIVITY): Troponin I (High Sensitivity): 4 ng/L (ref <18)

## 2021-09-29 LAB — I-STAT BETA HCG BLOOD, ED (MC, WL, AP ONLY): I-stat hCG, quantitative: <5 m[IU]/mL (ref <5)

## 2021-09-29 LAB — MAGNESIUM: Magnesium: 1.6 mg/dL — ABNORMAL LOW (ref 1.7–2.4)

## 2021-09-29 MED ORDER — MAGNESIUM OXIDE -MG SUPPLEMENT 400 (240 MG) MG PO TABS
400.0000 mg | ORAL_TABLET | Freq: Once | ORAL | Status: AC
  Administered 2021-09-29: 400 mg via ORAL
  Filled 2021-09-29: qty 1

## 2021-09-29 MED ORDER — LACTATED RINGERS IV BOLUS
1000.0000 mL | Freq: Once | INTRAVENOUS | Status: AC
  Administered 2021-09-29: 1000 mL via INTRAVENOUS

## 2021-09-29 MED ORDER — POTASSIUM CHLORIDE CRYS ER 20 MEQ PO TBCR: 20.0000 meq | EXTENDED_RELEASE_TABLET | Freq: Two times a day (BID) | ORAL | 0 refills | Status: DC

## 2021-09-29 MED ORDER — POTASSIUM CHLORIDE CRYS ER 20 MEQ PO TBCR
40.0000 meq | EXTENDED_RELEASE_TABLET | Freq: Once | ORAL | Status: AC
  Administered 2021-09-29: 40 meq via ORAL
  Filled 2021-09-29: qty 2

## 2021-09-29 MED ORDER — MAGNESIUM OXIDE -MG SUPPLEMENT 200 MG PO TABS: 200.0000 mg | ORAL_TABLET | Freq: Two times a day (BID) | ORAL | 0 refills | Status: AC

## 2021-09-29 NOTE — ED Provider Notes (Signed)
[facility] EMERGENCY DEPARTMENT Provider Note   CSN: [id number] Arrival date & time: 09/29/21  2126     History  Chief Complaint  Patient presents with   Tachycardia    hypertensive    Sheri Johnson is a 36 y.o. female.  HPI 36 year old female presents with SVT and chest pain.  She originally got SVT back in June 2022 and has been dealing with chest pain and arm weakness and numbness in her left arm ever since.  Tonight she was working out when she noticed blurry vision, palpitations, and recurrent SVT symptoms.  EMS was called and they gave adenosine.  Reportedly her blood pressure was 250/110.  However she continues to have left-sided chest pain/chest tightness and back discomfort and her arm does not feel well.  It seems like the symptoms have been going on nearly daily since her original SVT but she is worried about clots.  Home Medications Prior to Admission medications   Medication Sig Start Date End Date Taking? Authorizing Provider  Magnesium Oxide (MAG-OXIDE) 200 MG TABS Take 1 tablet (200 mg total) by mouth 2 (two) times daily for 3 days. 09/29/21 10/02/21 Yes Sherwood Gambler, MD  potassium chloride SA (KLOR-CON M) 20 MEQ tablet Take 1 tablet (20 mEq total) by mouth 2 (two) times daily for 3 days. 09/29/21 10/02/21 Yes Sherwood Gambler, MD  acetaminophen (TYLENOL) 325 MG tablet You can take 2 tablets every 6 hours for pain.  Your prescribed pain medicine also has Tylenol(acetaminophen) in it.  Count each one of those as a Tylenol tablet.  Do not take more than 4000 mg of Tylenol(acetaminophen) per day. Patient taking differently: Take 325 mg by mouth every 6 (six) hours as needed for moderate pain. You can take 2 tablets every 6 hours for pain.  Your prescribed pain medicine also has Tylenol(acetaminophen) in it.  Count each one of those as a Tylenol tablet.  Do not take more than 4000 mg of Tylenol(acetaminophen) per day. 12/15/16   Earnstine Regal, PA-C   albuterol (PROVENTIL HFA;VENTOLIN HFA) 108 (90 Base) MCG/ACT inhaler Inhale 1-2 puffs into the lungs every 6 (six) hours as needed for wheezing or shortness of breath.    [provider]  atenolol (TENORMIN) 50 MG tablet Take 1.5 tablets (75 mg total) by mouth daily. 01/19/21   [name], MD  busPIRone (BUSPAR) 15 MG tablet Take 15 mg by mouth in the morning and at bedtime. 11/08/20 11/08/21  [provider]  cetirizine (ZYRTEC) 10 MG tablet Take 10 mg by mouth daily.    [provider]  cyclobenzaprine (FLEXERIL) 10 MG tablet Take 5-10 mg by mouth 3 (three) times daily as needed for muscle spasms.    [provider]  ibuprofen (ADVIL,MOTRIN) 200 MG tablet You can take 2-3 tablets every 6 hours as needed for pain. 12/15/16   Earnstine Regal, PA-C  levothyroxine (SYNTHROID) 88 MCG tablet Take 88 mcg by mouth daily. 01/02/21   [provider]      Allergies    Cipro [ciprofloxacin hcl]    Review of Systems   Review of Systems  Constitutional:  Negative for fever.  Respiratory:  Positive for shortness of breath. Negative for cough.   Cardiovascular:  Positive for chest pain and palpitations.  Musculoskeletal:  Positive for back pain.  Neurological:  Positive for weakness and numbness.   Physical Exam Updated Vital Signs BP 113/60    Pulse (!) 104    Temp 98.5 F (36.9  C) (Oral)    Resp (!) 21    SpO2 98%  Physical Exam Vitals and nursing note reviewed.  Constitutional:      Appearance: She is well-developed. She is not ill-appearing or diaphoretic.  HENT:     Head: Normocephalic and atraumatic.  Cardiovascular:     Rate and Rhythm: Regular rhythm. Tachycardia present.     Pulses:          Radial pulses are 2+ on the right side and 2+ on the left side.     Heart sounds: Normal heart sounds.  Pulmonary:     Effort: Pulmonary effort is normal.     Breath sounds: Normal breath sounds.  Abdominal:     Palpations: Abdomen is soft.      Tenderness: There is no abdominal tenderness.  Skin:    General: Skin is warm and dry.  Neurological:     Mental Status: She is alert.     Comments: Normal strength/sensation in both upper extremities.  Psychiatric:        Mood and Affect: Mood is anxious.    ED Results / Procedures / Treatments   Labs (all labs ordered are listed, but only abnormal results are displayed) Labs Reviewed  BASIC METABOLIC PANEL - Abnormal; Notable for the following components:      Result Value   Potassium 3.2 (*)    Glucose, Bld 165 (*)    Creatinine, Ser 1.17 (*)    Calcium 8.6 (*)    All other components within normal limits  MAGNESIUM - Abnormal; Notable for the following components:   Magnesium 1.6 (*)    All other components within normal limits  CBC WITH DIFFERENTIAL/PLATELET  D-DIMER, QUANTITATIVE  I-STAT BETA HCG BLOOD, ED ([facility], [facility], [facility] ONLY)  TROPONIN I (HIGH SENSITIVITY)  TROPONIN I (HIGH SENSITIVITY)    EKG EKG Interpretation  Date/Time:  Thursday September 29 2021 23:09:25 EST Ventricular Rate:  100 PR Interval:  146 QRS Duration: 78 QT Interval:  405 QTC Calculation: 523 R Axis:   58 Text Interpretation: Sinus tachycardia Low voltage, precordial leads Borderline T abnormalities, anterior leads Prolonged QT interval similar to earlier in the day and June 2022 Confirmed by Sherwood Gambler [phone]) on 09/29/2021 11:31:17 PM  Radiology DG Chest 2 View  Result Date: 09/29/2021 CLINICAL DATA:  Chest pain EXAM: CHEST - 2 VIEW COMPARISON:  03/09/2021 FINDINGS: Minimal left basilar atelectasis. Lungs are otherwise clear. No pneumothorax or pleural effusion. Cardiac size within normal limits. Pulmonary vascularity is normal. No acute bone abnormality. IMPRESSION: No active cardiopulmonary disease. Electronically Signed   By: Fidela Salisbury M.D.   On: 09/29/2021 22:20    Procedures Procedures    Medications Ordered in ED Medications  lactated ringers bolus 1,000 mL (0 mLs Intravenous  Stopped 09/29/21 2339)  potassium chloride SA (KLOR-CON M) CR tablet 40 mEq (40 mEq Oral Given 09/29/21 2306)  magnesium oxide (MAG-OX) tablet 400 mg (400 mg Oral Given 09/29/21 2306)    ED Course/ Medical Decision Making/ A&P                           Medical Decision Making Amount and/or Complexity of Data Reviewed Labs: ordered. Radiology: ordered.  Risk OTC drugs. Prescription drug management.   Patient presents with continued chest symptoms both before and after SVT that was treated by EMS.  She was also noted to be quite hypertensive with EMS but no longer is here.  Chest x-ray was obtained and I personally reviewed the images and there is no obvious edema, pneumonia.  ECG shows some sinus tachycardia with a mildly prolonged QTc but appears similar to before.  I did briefly discussed with cardiology, Dr. Marcelle Smiling, who reviewed the ECG and agrees and advises no further treatment besides replating electrolytes.  Can follow-up with outpatient cardiology.  Low suspicion for ACS, PE, dissection.  D-dimer is negative and so I do not think further work-up for this is needed. hemoglobin and WBC is normal.  First troponin is normal.  However based on the time of onset of her chest pain tonight, will need a second troponin.  We will prescribe potassium and magnesium, which were also given here.  Care transferred to Dr. Laverta Baltimore with second troponin pending.  If this is negative I think she can be discharged home.        Final Clinical Impression(s) / ED Diagnoses Final diagnoses:  SVT (supraventricular tachycardia) ([place])  Hypokalemia  Hypomagnesemia    Rx / DC Orders ED Discharge Orders          Ordered    potassium chloride SA (KLOR-CON M) 20 MEQ tablet  2 times daily        09/29/21 2343    Magnesium Oxide (MAG-OXIDE) 200 MG TABS  2 times daily        09/29/21 [id number]              Sherwood Gambler, MD 09/29/21 2351

## 2021-09-29 NOTE — Discharge Instructions (Addendum)
Your potassium and magnesium were low today.  You are being prescribed these.  Follow-up with your primary care physician for recheck of this next week.  If you develop recurrent, continued, or worsening chest pain, shortness of breath, fever, vomiting, abdominal or back pain, or any other new/concerning symptoms then return to the ER for evaluation.

## 2021-09-29 NOTE — ED Triage Notes (Signed)
BIB [facility]-Working out at gym. Felt light-headed, SOB. Heart racing. EMS found Pt to be in SVT rate 170. Received 6 + 12 of Adenosine. Previously cardioverted (Adenosine) in June. Hypertensive with EMS 250/110. Has been feeling left sided chest pain on and off for several days- no correlation with activity.   HR 115 on arrival to ED.

## 2021-09-30 LAB — TROPONIN I (HIGH SENSITIVITY): Troponin I (High Sensitivity): 6 ng/L (ref <18)

## 2021-09-30 NOTE — ED Provider Notes (Signed)
Blood pressure 102/79, pulse 95, temperature 98.5 F (36.9 C), temperature source Oral, resp. rate 19, SpO2 95 %.  Assuming care from Dr. Criss Alvine.  In short, Sheri Johnson is a 36 y.o. female with a chief complaint of Tachycardia (hypertensive) .  Refer to the original H&P for additional details.  The current plan of care is to follow up on repeat troponin.  12:44 AM Repeat troponin is negative. Plan for discharge.     Maia Plan, MD 09/30/21 [phone]

## 2021-10-10 ENCOUNTER — Telehealth: Payer: Self-pay | Admitting: Cardiology

## 2021-10-10 DIAGNOSIS — I471 Supraventricular tachycardia: Secondary | ICD-10-CM

## 2021-10-10 NOTE — Telephone Encounter (Signed)
I spoke with the patient and advised her of Dr. Wendy Poet (DOD) recommendations to: ? ?1) Wear a ZIO monitor x 14 days ?2) Keep taking atenolol 50 mg BID as prescribed ?3) Drink plenty of fluids when walking ? ?The patient voices understanding of these recommendations and is agreeable. ? ?She has not been able to check her blood pressure during the day today. ? ?I have advised her for the next 2-3 days, it may be best of her to take Atenolol 25 mg in the AM (if SBP > 100) & take Atenolol 50 mg in the PM (if SBP > 100). ?If her BP tolerates, then she may increase Atenolol to 50 mg BID. ? ?The patient is agreeable with this plan and will monitor her BP at home. ? ?I have confirmed her home mailing address on file is correct. ?She is aware her ZIO will be ordered and shipped to her home address within 3-4 business days.  ? ?We have reviewed ?- no showers for 1st 24 hours after placement ?- no submerging the device completely under water ?- do not put anything around the device (lotion/ oil/ ointment) ?- do not get too excessively sweaty while wearing the monitor. ? ?She is aware I will forward to Dr. Stanford Breed as an Juluis Rainier-- to see if he would like to schedule her a follow up appointment in office in ~ 5 weeks to discuss her monitor results and symptoms.  ? ? ?

## 2021-10-10 NOTE — Telephone Encounter (Signed)
Pt c/o BP issue: STAT if pt c/o blurred vision, one-sided weakness or slurred speech ? ?1. What are your last 5 BP readings? 240/140 Feb. 23rd, 105/40 yesterday ? ?2. Are you having any other symptoms (ex. Dizziness, headache, blurred vision, passed out)? Lightheaded, feels cold and then very warm, muscle weakness ? ?3. What is your BP issue? Patient states her BP was very high 2/23 and was rushed to the hospital. She says a few days later she was on the way home from work when her BP and HR spiked so EMS came. She says they were able to calm it down so she went to an urgent care, She says urgent care increased her BP medication to 2x a day, but now she feels it is getting too low. She says yesterday it went down to 105/40 and almost fainted.  ?

## 2021-10-10 NOTE — Telephone Encounter (Signed)
I spoke with the patient. ? ?She called today reporting a an episode of SVT and very high BP on 09/29/21. ?SVT was treated by EMS, but they also had reported BP of ~ 250/110.  ?Patient seen in the ER on 2/23 with left sided chest pain/ chest tightness post SVT episode, but BP was reported as normal in the ER.  ?The patient advised her Potassium & Magnesium were low in the ER on 2/23 (K+- 3.2/ Mag- 1.7). ?She was given a short course of outpatient K-Dur 20 meq BID x 3 days. ? ?Patient reports that with her initial episode of SVT in June 2022 (when she started seeing cardiology), she feels like she was told her potassium & magnesium were both low at that time.  ? ?The patient then advised on 2/27, she was driving home from work and felt symptoms that she typically feels prior to going into SVT. ?She was seen at Urgent Care at that time.  ?She was advised her BP was again elevated at that time. ?She was advised to increase Atenolol from 75 mg once daily >> 50 mg BID. ?She was also told her TSH was elevated at 11.12 and her thyroid medication was adjusted.  ?She advised me that that is not the highest her TSH has ever read and she has not had symptoms like she is having now with higher levels. ? ?Yesterday, the patient reports that she went walking with her son and dog and felt lightheaded.  ?Her BP at home was 105/40.  ?She has held her dose of atenolol this morning, but has not checked her BP today (she is currently at work). ? ?She is advising today of other symptoms that she has been experiencing of: chest tightness/ muscle weakness/ abdominal bloating that are not new prior to today.   ? ?I have advised the patient that Dr. Jens Som is currently out of the office this week, but will forward to the [facility] office DOD for further advisement regarding atenolol.  ?She is scheduled to see her PCP on 10/13/21. ?She is not scheduled to see Dr. Jens Som until June 2023, but I feel like this will need to be moved up to  sooner with Dr. Jens Som APP ([place]/ [place] office).  ? ?The patient is aware we will call back with further recommendations and is agreeable.  ? ?Will CC Dr. Jens Som on this message as well.  ? ?

## 2021-10-10 NOTE — Telephone Encounter (Signed)
Sheri Lea, MD  ?SentSheral Flow October 10, 2021  3:36 PM  ?To: Jefferey Pica, RN  ? ? ?  ?   ? ?Message ? ?I would advise her to get Zio patch for 2 weeks to see exactly if she is experiencing supraventricular tachycardia, I would advise also to keep taking atenolol twice daily as prescribed.  She needs to make sure when she goes for a walk she need to drink plenty of fluids.  ? ?

## 2021-10-11 NOTE — Telephone Encounter (Signed)
Attempted to call patient, left message for patient to call back to office.  ? ?[name], MD  [name], RN 7 hours ago (7:16 AM)  ? ?Schedule APPov following monitor  ?Sheri Johnson   ? ?

## 2021-10-16 ENCOUNTER — Emergency Department (HOSPITAL_COMMUNITY): Payer: 59

## 2021-10-16 ENCOUNTER — Emergency Department (HOSPITAL_COMMUNITY)
Admission: EM | Admit: 2021-10-16 | Discharge: 2021-10-17 | Disposition: A | Discharge status: Home / Self Care | Payer: 59 | Attending: Emergency Medicine | Admitting: Emergency Medicine

## 2021-10-16 DIAGNOSIS — R202 Paresthesia of skin: Secondary | ICD-10-CM | POA: Diagnosis not present

## 2021-10-16 DIAGNOSIS — R0789 Other chest pain: Secondary | ICD-10-CM | POA: Diagnosis not present

## 2021-10-16 DIAGNOSIS — J45909 Unspecified asthma, uncomplicated: Secondary | ICD-10-CM | POA: Diagnosis not present

## 2021-10-16 DIAGNOSIS — R002 Palpitations: Secondary | ICD-10-CM | POA: Diagnosis present

## 2021-10-16 DIAGNOSIS — E876 Hypokalemia: Secondary | ICD-10-CM | POA: Insufficient documentation

## 2021-10-16 DIAGNOSIS — I10 Essential (primary) hypertension: Secondary | ICD-10-CM | POA: Insufficient documentation

## 2021-10-16 DIAGNOSIS — E039 Hypothyroidism, unspecified: Secondary | ICD-10-CM | POA: Diagnosis not present

## 2021-10-16 LAB — TROPONIN I (HIGH SENSITIVITY): Troponin I (High Sensitivity): 4 ng/L (ref <18)

## 2021-10-16 LAB — BASIC METABOLIC PANEL
Anion gap: 9 (ref 5–15)
BUN: 13 mg/dL (ref 6–20)
CO2: 23 mmol/L (ref 22–32)
Calcium: 9.3 mg/dL (ref 8.9–10.3)
Chloride: 105 mmol/L (ref 98–111)
Creatinine, Ser: 1 mg/dL (ref 0.44–1.00)
GFR, Estimated: >60 mL/min (ref >60)
Glucose, Bld: 109 mg/dL — ABNORMAL HIGH (ref 70–99)
Potassium: 3.7 mmol/L (ref 3.5–5.1)
Sodium: 137 mmol/L (ref 135–145)

## 2021-10-16 LAB — CBC
HCT: 39.8 % (ref 36.0–46.0)
Hemoglobin: 13.7 g/dL (ref 12.0–15.0)
MCH: 28.4 pg (ref 26.0–34.0)
MCHC: 34.4 g/dL (ref 30.0–36.0)
MCV: 82.6 fL (ref 80.0–100.0)
Platelets: 239 10*3/uL (ref 150–400)
RBC: 4.82 MIL/uL (ref 3.87–5.11)
RDW: 13 % (ref 11.5–15.5)
WBC: 10.6 10*3/uL — ABNORMAL HIGH (ref 4.0–10.5)
nRBC: 0 % (ref 0.0–0.2)

## 2021-10-16 NOTE — ED Provider Notes (Signed)
[facility] EMERGENCY DEPARTMENT Provider Note   CSN: [id number] Arrival date & time: 10/16/21  2027     History  Chief Complaint  Patient presents with   Chest Pain   Shortness of Breath   Palpitations   Tachycardia    Sheri Johnson is a 36 y.o. female.  The history is provided by the patient.  Chest Pain Associated symptoms: palpitations and shortness of breath   Shortness of Breath Associated symptoms: chest pain   Palpitations Associated symptoms: chest pain and shortness of breath   She has history of hypertension, SVT, asthma, anxiety, hypothyroidism and comes in with ongoing problems with palpitations as well as numbness in the left side of her body.  She has been seen in emergency several times since last June related to episodes of SVT which required adenosine for conversion.  She had seen a cardiologist had her dose of atenolol increased, but she was not able to tolerate the increased dose because of hypotension.  Over the last several weeks, she has noted episodes of tightness in her chest as well as numbness in the left side of her body including her arm and leg and occasionally her face.  Tightness in her chest can last up to 1 hour and is associated with dyspnea but no nausea or diaphoresis.  She has at times felt like her blood pressure has been elevated.  She states that her heart rate has gotten up as high as 120, but not as high as it had been when she required adenosine for cardioversion.  She also feels like there is some swelling across her lower chest and upper abdomen and feels like there might be some air or fluid trapped there.  She has noted some problems with her allergies and wonders if her allergies might be contributing to some of her problems.  She is scheduled to have a Zio patch applied, but it is currently in the mail.  She is scheduled for a follow-up with her cardiologist in 3 months.   Home Medications Prior to Admission  medications   Medication Sig Start Date End Date Taking? Authorizing Provider  acetaminophen (TYLENOL) 325 MG tablet You can take 2 tablets every 6 hours for pain.  Your prescribed pain medicine also has Tylenol(acetaminophen) in it.  Count each one of those as a Tylenol tablet.  Do not take more than 4000 mg of Tylenol(acetaminophen) per day. Patient taking differently: Take 325 mg by mouth every 6 (six) hours as needed for moderate pain. You can take 2 tablets every 6 hours for pain.  Your prescribed pain medicine also has Tylenol(acetaminophen) in it.  Count each one of those as a Tylenol tablet.  Do not take more than 4000 mg of Tylenol(acetaminophen) per day. 12/15/16   Sheri George, PA-C  albuterol (PROVENTIL HFA;VENTOLIN HFA) 108 (90 Base) MCG/ACT inhaler Inhale 1-2 puffs into the lungs every 6 (six) hours as needed for wheezing or shortness of breath.    [provider]  atenolol (TENORMIN) 50 MG tablet Take 1.5 tablets (75 mg total) by mouth daily. 01/19/21   Sheri Bunting, MD  busPIRone (BUSPAR) 15 MG tablet Take 15 mg by mouth in the morning and at bedtime. 11/08/20 11/08/21  [provider]  cetirizine (ZYRTEC) 10 MG tablet Take 10 mg by mouth daily.    [provider]  cyclobenzaprine (FLEXERIL) 10 MG tablet Take 5-10 mg by mouth 3 (three) times daily as needed for muscle spasms.  [provider]  ibuprofen (ADVIL,MOTRIN) 200 MG tablet You can take 2-3 tablets every 6 hours as needed for pain. 12/15/16   Sheri Regal, PA-C  levothyroxine (SYNTHROID) 88 MCG tablet Take 88 mcg by mouth daily. 01/02/21   [provider]  potassium chloride SA (KLOR-CON M) 20 MEQ tablet Take 1 tablet (20 mEq total) by mouth 2 (two) times daily for 3 days. 09/29/21 10/02/21  Sheri Gambler, MD      Allergies    Cipro [ciprofloxacin hcl]    Review of Systems   Review of Systems  Respiratory:  Positive for shortness of breath.   Cardiovascular:  Positive  for chest pain and palpitations.  All other systems reviewed and are negative.  Physical Exam Updated Vital Signs BP 140/75    Pulse 96    Temp 97.8 F (36.6 C) (Oral)    Resp 20    SpO2 100%  Physical Exam Vitals and nursing note reviewed.  36 year old female, resting comfortably and in no acute distress. Vital signs are significant for borderline elevated blood pressure. Oxygen saturation is 100%, which is normal. Head is normocephalic and atraumatic. PERRLA, EOMI. Oropharynx is clear. Neck is nontender and supple without adenopathy or JVD. Back is nontender and there is no CVA tenderness. Lungs are clear without rales, wheezes, or rhonchi. Chest is mildly to moderately tender in the parasternal area bilaterally, and in the epigastric area.  There is no crepitus. Heart has regular rate and rhythm without murmur. Abdomen is soft, flat, nontender without masses or hepatosplenomegaly and peristalsis is normoactive. Extremities have no cyanosis or edema, full range of motion is present. Skin is warm and dry without rash. Neurologic: Mental status is normal.  There is slight decrease sensation in the left side of her chin.  Also, decreased sensation in the left arm and left leg compared with the right.  Strength is 5/5 in all 4 extremities, no pronator drift present.  ED Results / Procedures / Treatments   Labs (all labs ordered are listed, but only abnormal results are displayed) Labs Reviewed  BASIC METABOLIC PANEL - Abnormal; Notable for the following components:      Result Value   Glucose, Bld 109 (*)    All other components within normal limits  CBC - Abnormal; Notable for the following components:   WBC 10.6 (*)    All other components within normal limits  MAGNESIUM  TROPONIN I (HIGH SENSITIVITY)  TROPONIN I (HIGH SENSITIVITY)    EKG ISOLA, KELTZ [id number] 16-Oct-2021 20:32:11 [facility]/ED ROUTINE RECORD November 28, 1985 (29 yr) Female  Caucasian Room:[facility] Loc:11 Technician: [phone] Test [id number] Pain Vent. rate 109 BPM PR interval 132 ms QRS duration 84 ms QT/QTcB 324/436 ms P-R-T axes 69 86 16 Sinus tachycardia Nonspecific T wave abnormality Abnormal ECG When compared with ECG of 29-Sep-2021 23:09, QT has shortened Confirmed by Delora Fuel ([id number]) on 10/16/2021 11:55:46 PM Confirmed By: Delora Fuel  EKG Interpretation  Date/Time:  Sunday October 16 2021 23:46:51 EDT Ventricular Rate:  87 PR Interval:  133 QRS Duration: 92 QT Interval:  385 QTC Calculation: 464 R Axis:   70 Text Interpretation: Sinus rhythm Low voltage, precordial leads Borderline T abnormalities, diffuse leads When compared with ECG of EARLIER SAME DATE No significant change was found Confirmed by Delora Fuel [id number]) on 10/16/2021 11:56:51 PM  Radiology DG Chest 2 View  Result Date: 10/16/2021 CLINICAL DATA:  Chest pain, tachycardia and shortness of breath. EXAM: CHEST - 2 VIEW  COMPARISON:  PA Lat 09/29/2021 FINDINGS: The heart size and mediastinal contours are within normal limits. Both lungs are hyperinflated but clear. The visualized skeletal structures are unremarkable. IMPRESSION: No active cardiopulmonary disease.  Stable hyperinflated chest. Electronically Signed   By: Telford Nab M.D.   On: 10/16/2021 22:08   MR BRAIN WO CONTRAST  Result Date: 10/17/2021 CLINICAL DATA:  Increasing tachycardia with chest pain. Left-sided unilateral weakness since June 2022. EXAM: MRI HEAD WITHOUT CONTRAST TECHNIQUE: Multiplanar, multiecho pulse sequences of the brain and surrounding structures were obtained without intravenous contrast. COMPARISON:  None. FINDINGS: Brain: No infarction, hemorrhage, hydrocephalus, extra-axial collection or mass lesion. No white matter disease or brain atrophy Vascular: Normal flow voids. Skull and upper cervical spine: Retention cysts in the inferior right maxillary sinus. Sinuses/Orbits: Negative IMPRESSION: Normal MRI  of the brain. Electronically Signed   By: Jorje Guild M.D.   On: 10/17/2021 04:52    Procedures Procedures  Cardiac monitor, per my interpretation, shows normal sinus rhythm with episodes of sinus tachycardia with heart rate up to 126.  Medications Ordered in ED Medications  LORazepam (ATIVAN) tablet 1 mg (1 mg Oral Given 10/17/21 0022)  potassium chloride SA (KLOR-CON M) CR tablet 40 mEq (40 mEq Oral Given 10/17/21 0022)  magnesium oxide (MAG-OX) tablet 400 mg (400 mg Oral Given 10/17/21 0450)    ED Course/ Medical Decision Making/ A&P                           Medical Decision Making Amount and/or Complexity of Data Reviewed Labs: ordered. Radiology: ordered.  Risk OTC drugs. Prescription drug management.   Multiple complaints including palpitations, left-sided numbness, chest discomfort.  Old records are reviewed confirming 3 prior ED/urgent care visits for SVT requiring adenosine conversion by EMS.  Cardiology consultation was noted with recommendation to increase atenolol which patient was not able to tolerate.  She does show labile heart rate during my examination and I suspect there is some anxiety component as well as her documented SVT.  Chest pain seems somewhat atypical.  ECG shows T wave flattening, but she is also noted to be borderline hypokalemic which probably accounts for her T wave flattening.  On review of old records, she also was noted to have hypokalemia or borderline potassium as well as hypomagnesemia.  We will check magnesium level today, she is likely to need potassium supplementation.  Hypokalemia and hypomagnesemia can be contributing to her arrhythmias.  Patient is wondering whether neurology consultation would be necessary.  We will check MRI to rule out stroke or tumor to account for her paresthesias.  Magnesium level has come back at 1.9.  Although this is normal, in the setting of heart rhythm disturbance, ideal magnesium should be 2.0 or higher and I  feel potassium should be 4.0 or higher.  She is given a dose of oral potassium and magnesium.  MRI of the brain is normal.  I have independently viewed the images, and agree with the radiologist's interpretation.  No evidence of stroke.  In the setting of unusual neurologic symptoms, need to consider possibility of multiple sclerosis.  Patient is advised that if symptoms persist, she should be evaluated by a neurologist.  She is discharged with prescriptions for potassium and magnesium and is referred back to her cardiologist for management of her palpitations and chest discomfort.        Final Clinical Impression(s) / ED Diagnoses Final diagnoses:  Palpitations  Paresthesia  of left arm and leg  Atypical chest pain  Hypokalemia    Rx / DC Orders ED Discharge Orders          Ordered    magnesium chloride (SLOW-MAG) 64 MG TBEC SR tablet  Daily        10/17/21 0507    potassium chloride SA (KLOR-CON M) 20 MEQ tablet  Daily        10/17/21 [id number]              Delora Fuel, MD [phone]

## 2021-10-16 NOTE — ED Notes (Signed)
Pt advises she has follow up appt w Cards in June ?

## 2021-10-16 NOTE — ED Triage Notes (Signed)
Pt c/o increasing episodes of tachycardia, chest pain/tighteness, SHOB, L sided unilateral weakness since June 2022, worse especially x2 wks. Pt concerned as to why work up hasn't revealed underlying cause.  ?

## 2021-10-17 ENCOUNTER — Emergency Department (HOSPITAL_COMMUNITY): Payer: 59

## 2021-10-17 LAB — MAGNESIUM: Magnesium: 1.9 mg/dL (ref 1.7–2.4)

## 2021-10-17 LAB — TROPONIN I (HIGH SENSITIVITY): Troponin I (High Sensitivity): 3 ng/L (ref <18)

## 2021-10-17 MED ORDER — LORAZEPAM 1 MG PO TABS
1.0000 mg | ORAL_TABLET | Freq: Once | ORAL | Status: AC
  Administered 2021-10-17: 1 mg via ORAL
  Filled 2021-10-17: qty 1

## 2021-10-17 MED ORDER — POTASSIUM CHLORIDE CRYS ER 20 MEQ PO TBCR
40.0000 meq | EXTENDED_RELEASE_TABLET | Freq: Once | ORAL | Status: AC
Start: 2021-10-17 — End: 2021-10-17
  Administered 2021-10-17: 40 meq via ORAL
  Filled 2021-10-17: qty 2

## 2021-10-17 MED ORDER — MAGNESIUM OXIDE -MG SUPPLEMENT 400 (240 MG) MG PO TABS
400.0000 mg | ORAL_TABLET | Freq: Once | ORAL | Status: AC
  Administered 2021-10-17: 400 mg via ORAL
  Filled 2021-10-17: qty 1

## 2021-10-17 MED ORDER — SLOW-MAG 71.5-119 MG PO TBEC
1.0000 | DELAYED_RELEASE_TABLET | Freq: Every day | ORAL | 0 refills | Status: DC
Start: 2021-10-17 — End: 2022-03-27

## 2021-10-17 MED ORDER — POTASSIUM CHLORIDE CRYS ER 20 MEQ PO TBCR: 20.0000 meq | EXTENDED_RELEASE_TABLET | Freq: Every day | ORAL | 0 refills | Status: DC

## 2021-10-17 NOTE — ED Notes (Signed)
Pt to MRI at this time.

## 2021-10-17 NOTE — Discharge Instructions (Signed)
Your evaluation today did not show any sign of a stroke, and your heart rhythm was normal.  However, your magnesium and potassium are both in the lower range of normal, and even low normal levels can sometimes contribute to some heart rhythm problems.  You have been given prescriptions for magnesium and potassium supplements.  Please continue to take them.  Follow-up with your cardiologist regarding further evaluation of your chest pain and palpitations.  If the numbness you are having persist, consider getting an evaluation by a neurologist. ?

## 2021-10-18 NOTE — Telephone Encounter (Signed)
Follow up scheduled

## 2021-10-21 NOTE — Telephone Encounter (Signed)
Patient is following up. States she has not received her heart monitor and would like to know if we have any updates and when she should be expecting it. Confirmed home address on file is accurate. Please advise. ?

## 2021-10-21 NOTE — Telephone Encounter (Signed)
Attempted to contact patient to discuss, unable to leave a message as voicemail was full- I will send message to [facility] and [place] to see if they are aware of anything in regards to the monitor.  ? ?Thanks! ?

## 2021-10-24 ENCOUNTER — Ambulatory Visit (INDEPENDENT_AMBULATORY_CARE_PROVIDER_SITE_OTHER): Payer: 59

## 2021-10-24 DIAGNOSIS — I471 Supraventricular tachycardia: Secondary | ICD-10-CM

## 2021-10-24 NOTE — Telephone Encounter (Signed)
The monitor will need to be mailed. The order was put in wrong for the [place] [facility] and should have been [address]. ?

## 2021-10-24 NOTE — Telephone Encounter (Signed)
Left message for pt that monitor is being mailed. ?

## 2021-10-24 NOTE — Progress Notes (Unsigned)
Enrolled for [employer] to mail a ZIO XT long term holter monitor to the patients address on file.  ? ?Dr. Stanford Breed to read.  Follow up appointment 02/01/22. ?

## 2021-10-29 DIAGNOSIS — I471 Supraventricular tachycardia: Secondary | ICD-10-CM | POA: Diagnosis not present

## 2021-10-31 ENCOUNTER — Telehealth: Payer: Self-pay | Admitting: Cardiology

## 2021-10-31 NOTE — Telephone Encounter (Signed)
New Message: ? ? ? ? ?Patient have a rash from wearing the monitor. She thinks she is probably allergic to the adhesive. She have stopped wearing it, wants  to know what does she need to do? ?

## 2021-10-31 NOTE — Telephone Encounter (Signed)
Patient advised to send monitor back to [facility] to process whatever data was recorded.  Patient states there should be at least a day of recording. ?She is using Benadryl for her rash. ?

## 2021-11-01 ENCOUNTER — Telehealth: Payer: Self-pay | Admitting: Cardiology

## 2021-11-01 NOTE — Telephone Encounter (Signed)
Patient states she has been reading online about her symptoms and saw that she has all the symptoms of ATTR. She says she is concerned because it says the condition is often undiagnosed and she would like to know if there are any tests she can have done to rule it out.  ?

## 2021-11-01 NOTE — Telephone Encounter (Signed)
Spoke with pt, Aware of dr Ludwig Clarks recommendations. She reports and increase in her symptoms. Reassurance given to the patient and follow up scheduled. ?

## 2021-11-05 ENCOUNTER — Telehealth: Payer: Self-pay | Admitting: Physician Assistant

## 2021-11-05 MED ORDER — METOPROLOL TARTRATE 50 MG PO TABS: 75.0000 mg | ORAL_TABLET | Freq: Two times a day (BID) | ORAL | 0 refills | Status: DC

## 2021-11-05 NOTE — Telephone Encounter (Signed)
? ?  The patient called the answering service after-hours today. She has been dealing with increasing frequency of episodes of SVT over the last several months, has had several ER visits for this. This is originally captured in EMS run sheets under Media in 01/2021. She was started on K/Mg supplement earlier this month in ED. Today she had to again call EMS out to her house because her vagal maneuvers did not work. Per her report, BP 180/110 and HR 216 when she got there, states she was given 2 doses of adenosine with conversion to NSR. Follow-up BP reading 133/72. She otherwise feels well post conversion. Has had digestive issues aside from recent SVT but otherwise no acute CP/SOB or syncope. She was recently wearing a Zio but stopped due to skin rash. She also went to urgent care a month ago and they tried to increase atenolol from 75mg  daily to 50mg  BID but she did not tolerate due to fatigue/decrease in BP to [id number] systolic. Discussed with Dr. Stanford Breed. At this juncture needs to see EP to consider ablation. Per our discussion he does not think transition to CCB would help, he would prefer to continue beta blocker but will try alternative in the form of Lopressor 75mg  BID. Patient aware to d/c atenolol and try metoprolol. Rx sent into requested pharmacy on [address]. ER precautions reviewed with patient. She declined to proceed to ED today and would like to try medicine change. ? ?Will route this msg to EP scheduler to assist with getting patient into EP team to discuss medication vs ablation.   ? ?Charlie Pitter, PA-C ? ?

## 2021-11-14 DIAGNOSIS — IMO0001 Reserved for inherently not codable concepts without codable children: Secondary | ICD-10-CM

## 2021-11-14 DIAGNOSIS — O039 Complete or unspecified spontaneous abortion without complication: Secondary | ICD-10-CM

## 2021-11-14 HISTORY — DX: Complete or unspecified spontaneous abortion without complication: O03.9

## 2021-11-14 HISTORY — DX: Reserved for inherently not codable concepts without codable children: IMO0001

## 2021-11-21 ENCOUNTER — Telehealth: Payer: Self-pay | Admitting: Cardiology

## 2021-11-21 NOTE — Telephone Encounter (Signed)
?*  STAT* If patient is at the pharmacy, call can be transferred to refill team. ? ? ?1. Which medications need to be refilled? (please list name of each medication and dose if known) new prescription forPotassium Chloride ? ?2. Which pharmacy/location (including street and city if local pharmacy) is medication to be sent to?[facility] [address], [place] ? ?3. Do they need a 30 day or 90 day supply? 30 days ? ?

## 2021-11-23 NOTE — Progress Notes (Signed)
? ? ? ? ?HPI: FU SVT. Had covid 1/22. Seen with palpitations 01/08/21; was in SVT (ECG with SVT 231 with diffuse ST depression); EMS terminated arrhythmia with adenosine. 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  Echocardiogram July 2022 showed normal LV function, mild left atrial enlargement.  Monitor April 2023 showed sinus rhythm with rare PACs and PVCs.  Patient recently contacted the office with concern about possibly having amyloid.  Seen in the emergency room March 2023 with complaints of palpitations, left-sided numbness, chest pain.  Troponins were normal.  Since last seen patient states that she has been seen several times since February with recurrent SVT.  I do not have those strips available but it was similar symptoms as to though she experienced in June.  She feels the sudden onset of her heart racing.  There is associated dyspnea and dizziness but no frank syncope.  There is an uncomfortable feeling in her chest.  Prior to February she was able to exercise with mild dyspnea but no chest pain.  Note she was also pregnant and this was terminated approximately 2 weeks ago. ? ?Current Outpatient Medications  ?Medication Sig Dispense Refill  ? acetaminophen (TYLENOL) 325 MG tablet You can take 2 tablets every 6 hours for pain.  Your prescribed pain medicine also has Tylenol(acetaminophen) in it.  Count each one of those as a Tylenol tablet.  Do not take more than 4000 mg of Tylenol(acetaminophen) per day. (Patient taking differently: Take 325 mg by mouth every 6 (six) hours as needed for moderate pain. You can take 2 tablets every 6 hours for pain.  Your prescribed pain medicine also has Tylenol(acetaminophen) in it.  Count each one of those as a Tylenol tablet.  Do not take more than 4000 mg of Tylenol(acetaminophen) per day.) 100 tablet 2  ? ALPRAZolam (XANAX) 0.5 MG tablet Take 0.5 mg by mouth daily as needed.    ? ATROVENT HFA 17 MCG/ACT inhaler Inhale 1  puff into the lungs as needed.    ? busPIRone (BUSPAR) 15 MG tablet Take 15 mg by mouth 2 (two) times daily.    ? cetirizine (ZYRTEC) 10 MG tablet Take 10 mg by mouth daily.    ? cyclobenzaprine (FLEXERIL) 10 MG tablet Take 10 mg by mouth as needed.    ? ibuprofen (ADVIL,MOTRIN) 200 MG tablet You can take 2-3 tablets every 6 hours as needed for pain.    ? levothyroxine (SYNTHROID) 88 MCG tablet Take 88 mcg by mouth daily.    ? LINZESS 72 MCG capsule Take 72 mcg by mouth as needed (irritable bowel symptoms).    ? magnesium chloride (SLOW-MAG) 64 MG TBEC SR tablet Take 1 tablet (64 mg total) by mouth daily. (Patient taking differently: Take 250 tablets by mouth daily.) 30 tablet 0  ? metoprolol tartrate (LOPRESSOR) 50 MG tablet Take 1.5 tablets (75 mg total) by mouth 2 (two) times daily. 90 tablet 0  ? potassium chloride SA (KLOR-CON M) 20 MEQ tablet Take 1 tablet (20 mEq total) by mouth daily. 30 tablet 0  ? ?No current facility-administered medications for this visit.  ? ? ? ?Past Medical History:  ?Diagnosis Date  ? Abortion 11/14/2021  ? Anxiety   ? Asthma   ? Hypertension   ? Hypothyroid   ? SVT (supraventricular tachycardia) (HCC)   ? ? ?Past Surgical History:  ?Procedure Laterality Date  ? LAPAROSCOPIC APPENDECTOMY N/A 12/14/2016  ? Procedure: APPENDECTOMY LAPAROSCOPIC;  Surgeon: Carolynne Edouard,  Lynetta Mare, MD;  Location: [facility] OR;  Service: General;  Laterality: N/A;  ? ? ?Social History  ? ?Socioeconomic History  ? Marital status: Single  ?  Spouse name: Not on file  ? Number of children: 3  ? Years of education: Not on file  ? Highest education level: Not on file  ?Occupational History  ? Not on file  ?Tobacco Use  ? Smoking status: Never  ? Smokeless tobacco: Never  ?Substance and Sexual Activity  ? Alcohol use: Yes  ?  Comment: Rare  ? Drug use: Not on file  ? Sexual activity: Not on file  ?Other Topics Concern  ? Not on file  ?Social History Narrative  ? Not on file  ? ?Social Determinants of Health  ? ?Financial  Resource Strain: Not on file  ?Food Insecurity: Not on file  ?Transportation Needs: Not on file  ?Physical Activity: Not on file  ?Stress: Not on file  ?Social Connections: Not on file  ?Intimate Partner Violence: Not on file  ? ? ?Family History  ?Problem Relation Age of Onset  ? Hypothyroidism Mother   ? ? ?ROS: no fevers or chills, productive cough, hemoptysis, dysphasia, odynophagia, melena, hematochezia, dysuria, hematuria, rash, seizure activity, orthopnea, PND, pedal edema, claudication. Remaining systems are negative. ? ?Physical Exam: ?Well-developed well-nourished in no acute distress.  ?Skin is warm and dry.  ?HEENT is normal.  ?Neck is supple.  ?Chest is clear to auscultation with normal expansion.  ?Cardiovascular exam is regular rate and rhythm.  ?Abdominal exam nontender or distended. No masses palpated. ?Extremities show no edema. ?neuro grossly intact ? ? ?A/P ? ?1 SVT-we will continue metoprolol.  She did not tolerate higher doses previously.  She is now having recurrent episodes.  Plan to refer to electrophysiology for consideration of ablation.  We will obtain EMS run sheets for most recent episodes of SVT.  We do have documentation of her SVT on a rhythm strip from June. ? ?2 hypertension-patient's blood pressure is controlled.  Continue present medications. ? ?3 anxiety-follow-up primary care. ? ?Olga Millers, MD ? ? ? ?

## 2021-11-24 ENCOUNTER — Other Ambulatory Visit: Payer: Self-pay

## 2021-11-24 MED ORDER — POTASSIUM CHLORIDE CRYS ER 20 MEQ PO TBCR: 20.0000 meq | EXTENDED_RELEASE_TABLET | Freq: Every day | ORAL | 0 refills | Status: DC

## 2021-11-24 NOTE — Telephone Encounter (Signed)
Called patient to advise refill sent to pharmacy. °

## 2021-11-25 ENCOUNTER — Emergency Department (HOSPITAL_COMMUNITY): Payer: 59

## 2021-11-25 ENCOUNTER — Emergency Department (HOSPITAL_COMMUNITY)
Admission: EM | Admit: 2021-11-25 | Discharge: 2021-11-25 | Disposition: A | Discharge status: Home / Self Care | Payer: 59 | Attending: Emergency Medicine | Admitting: Emergency Medicine

## 2021-11-25 ENCOUNTER — Encounter (HOSPITAL_COMMUNITY): Payer: Self-pay

## 2021-11-25 ENCOUNTER — Other Ambulatory Visit: Payer: Self-pay

## 2021-11-25 DIAGNOSIS — Z79899 Other long term (current) drug therapy: Secondary | ICD-10-CM | POA: Diagnosis not present

## 2021-11-25 DIAGNOSIS — R42 Dizziness and giddiness: Secondary | ICD-10-CM | POA: Insufficient documentation

## 2021-11-25 DIAGNOSIS — R000 Tachycardia, unspecified: Secondary | ICD-10-CM | POA: Diagnosis not present

## 2021-11-25 DIAGNOSIS — R1012 Left upper quadrant pain: Secondary | ICD-10-CM | POA: Diagnosis not present

## 2021-11-25 DIAGNOSIS — K0889 Other specified disorders of teeth and supporting structures: Secondary | ICD-10-CM | POA: Insufficient documentation

## 2021-11-25 DIAGNOSIS — R002 Palpitations: Secondary | ICD-10-CM | POA: Diagnosis present

## 2021-11-25 DIAGNOSIS — R1011 Right upper quadrant pain: Secondary | ICD-10-CM | POA: Insufficient documentation

## 2021-11-25 DIAGNOSIS — R1032 Left lower quadrant pain: Secondary | ICD-10-CM | POA: Diagnosis not present

## 2021-11-25 DIAGNOSIS — R1031 Right lower quadrant pain: Secondary | ICD-10-CM | POA: Diagnosis not present

## 2021-11-25 DIAGNOSIS — R1013 Epigastric pain: Secondary | ICD-10-CM

## 2021-11-25 DIAGNOSIS — R0789 Other chest pain: Secondary | ICD-10-CM | POA: Diagnosis not present

## 2021-11-25 DIAGNOSIS — R0602 Shortness of breath: Secondary | ICD-10-CM | POA: Insufficient documentation

## 2021-11-25 LAB — HCG, QUANTITATIVE, PREGNANCY: hCG, Beta Chain, Quant, S: 123 m[IU]/mL — ABNORMAL HIGH (ref <5)

## 2021-11-25 LAB — CBC WITH DIFFERENTIAL/PLATELET
Abs Immature Granulocytes: 0.03 10*3/uL (ref 0.00–0.07)
Basophils Absolute: 0 10*3/uL (ref 0.0–0.1)
Basophils Relative: 0 %
Eosinophils Absolute: 0.2 10*3/uL (ref 0.0–0.5)
Eosinophils Relative: 2 %
HCT: 41.4 % (ref 36.0–46.0)
Hemoglobin: 13.7 g/dL (ref 12.0–15.0)
Immature Granulocytes: 0 %
Lymphocytes Relative: 15 %
Lymphs Abs: 1.5 10*3/uL (ref 0.7–4.0)
MCH: 27.9 pg (ref 26.0–34.0)
MCHC: 33.1 g/dL (ref 30.0–36.0)
MCV: 84.3 fL (ref 80.0–100.0)
Monocytes Absolute: 0.6 10*3/uL (ref 0.1–1.0)
Monocytes Relative: 6 %
Neutro Abs: 8 10*3/uL — ABNORMAL HIGH (ref 1.7–7.7)
Neutrophils Relative %: 77 %
Platelets: 292 10*3/uL (ref 150–400)
RBC: 4.91 MIL/uL (ref 3.87–5.11)
RDW: 13.6 % (ref 11.5–15.5)
WBC: 10.4 10*3/uL (ref 4.0–10.5)
nRBC: 0 % (ref 0.0–0.2)

## 2021-11-25 LAB — LIPASE, BLOOD: Lipase: 29 U/L (ref 11–51)

## 2021-11-25 LAB — COMPREHENSIVE METABOLIC PANEL
ALT: 18 U/L (ref 0–44)
AST: 18 U/L (ref 15–41)
Albumin: 4.2 g/dL (ref 3.5–5.0)
Alkaline Phosphatase: 36 U/L — ABNORMAL LOW (ref 38–126)
Anion gap: 7 (ref 5–15)
BUN: 10 mg/dL (ref 6–20)
CO2: 23 mmol/L (ref 22–32)
Calcium: 9.2 mg/dL (ref 8.9–10.3)
Chloride: 109 mmol/L (ref 98–111)
Creatinine, Ser: 1.02 mg/dL — ABNORMAL HIGH (ref 0.44–1.00)
GFR, Estimated: >60 mL/min (ref >60)
Glucose, Bld: 102 mg/dL — ABNORMAL HIGH (ref 70–99)
Potassium: 4.4 mmol/L (ref 3.5–5.1)
Sodium: 139 mmol/L (ref 135–145)
Total Bilirubin: 0.7 mg/dL (ref 0.3–1.2)
Total Protein: 7 g/dL (ref 6.5–8.1)

## 2021-11-25 MED ORDER — HYDROXYZINE HCL 25 MG PO TABS: 25.0000 mg | ORAL_TABLET | Freq: Four times a day (QID) | ORAL | 0 refills | Status: DC

## 2021-11-25 MED ORDER — IOHEXOL 350 MG/ML SOLN
100.0000 mL | Freq: Once | INTRAVENOUS | Status: AC | PRN
Start: 2021-11-25 — End: 2021-11-25
  Administered 2021-11-25: 100 mL via INTRAVENOUS

## 2021-11-25 NOTE — ED Provider Notes (Signed)
?[place] ?Provider Note ? ? ?CSN: [id number] ?Arrival date & time: 11/25/21  1037 ? ?  ? ?History ? ?Chief Complaint  ?Patient presents with  ? Abdominal Pain  ? Chest Pain  ? ? ?Sheri Johnson is a 36 y.o. female. ? ? ?Abdominal Pain ?Associated symptoms: chest pain   ?Chest Pain ?Associated symptoms: abdominal pain   ?Patient presents with dental pain chest pain and feeling bad.  Has had palpitations for a while now.  Worried about her heart.  Partially due to this patient around 10 days ago had an abortion.  Was done under sedation.  Had bleeding for a couple days.  Some mild pain.  However on Monday with today being Friday began to feel worse.  States it started after eating [place] food.  Has chest tightness.  More pain in the lower abdomen also fullness in her upper abdomen.  No nausea or vomiting.  Does have a history of some irritable bowel in the past.  Feels more short of breath.  Feels full in the abdomen states it is harder to breathe.  States she feels if she is more swollen.  Went to urgent care and sent in.  Positive pregnancy test there.  Had been about [redacted] weeks pregnant.  Also had about 5 days of Prozac and finished that up a few weeks ago.  Also had previously been on benzos.  States she had a 30-day supply that was used as a 90-day supply.  States her new PCP however would not refill it for her.  States she is on BuSpar.  Does have history of anxiety ?Feeling more lightheaded and more short of breath. ?History of SVT but not currently in SVT. ?Home Medications ?Prior to Admission medications   ?Medication Sig Start Date End Date Taking? Authorizing Provider  ?acetaminophen (TYLENOL) 325 MG tablet You can take 2 tablets every 6 hours for pain.  Your prescribed pain medicine also has Tylenol(acetaminophen) in it.  Count each one of those as a Tylenol tablet.  Do not take more than 4000 mg of Tylenol(acetaminophen) per day. ?Patient taking differently: Take  325 mg by mouth every 6 (six) hours as needed for moderate pain. You can take 2 tablets every 6 hours for pain.  Your prescribed pain medicine also has Tylenol(acetaminophen) in it.  Count each one of those as a Tylenol tablet.  Do not take more than 4000 mg of Tylenol(acetaminophen) per day. 12/15/16  Yes Earnstine Regal, PA-C  ?ALPRAZolam (XANAX) 0.5 MG tablet Take 0.5 mg by mouth daily as needed. 07/07/21  Yes [provider]  ?ATROVENT HFA 17 MCG/ACT inhaler Inhale 1 puff into the lungs as needed. 08/23/21  Yes [provider]  ?busPIRone (BUSPAR) 15 MG tablet Take 15 mg by mouth 2 (two) times daily. 12/20/20  Yes [provider]  ?cetirizine (ZYRTEC) 10 MG tablet Take 10 mg by mouth daily.   Yes [provider]  ?cyclobenzaprine (FLEXERIL) 10 MG tablet Take 10 mg by mouth as needed. 07/07/21  Yes [provider]  ?ibuprofen (ADVIL,MOTRIN) 200 MG tablet You can take 2-3 tablets every 6 hours as needed for pain. 12/15/16  Yes Earnstine Regal, PA-C  ?levothyroxine (SYNTHROID) 88 MCG tablet Take 88 mcg by mouth daily. 01/02/21  Yes [provider]  ?Rolan Lipa 72 MCG capsule Take 72 mcg by mouth as needed (irritable bowel symptoms). 10/13/21  Yes [provider]  ?magnesium chloride (SLOW-MAG) 64 MG TBEC SR tablet Take 1  tablet (64 mg total) by mouth daily. ?Patient taking differently: Take 250 tablets by mouth daily. [id number]  Yes Delora Fuel, MD  ?metoprolol tartrate (LOPRESSOR) 50 MG tablet Take 1.5 tablets (75 mg total) by mouth 2 (two) times daily. 11/05/21 02/03/22 Yes Dunn, Dayna N, PA-C  ?potassium chloride SA (KLOR-CON M) 20 MEQ tablet Take 1 tablet (20 mEq total) by mouth daily. 11/24/21  Yes [name], MD  ?albuterol (PROVENTIL HFA;VENTOLIN HFA) 108 (90 Base) MCG/ACT inhaler Inhale 1-2 puffs into the lungs every 6 (six) hours as needed for wheezing or shortness of breath. ?Patient not taking: Reported on 11/25/2021    [provider]  ?    ? ?Allergies    ?Cipro [ciprofloxacin hcl] and Prednisone   ? ?Review of Systems   ?Review of Systems  ?Cardiovascular:  Positive for chest pain.  ?Gastrointestinal:  Positive for abdominal pain.  ? ?Physical Exam ?Updated Vital Signs ?BP 131/69   Pulse (!) 107   Resp 17   Ht 5\' 9"  (1.753 m)   Wt 108.9 kg   SpO2 97%   BMI 35.44 kg/m?  ?Physical Exam ?Vitals and nursing note reviewed.  ?HENT:  ?   Head: Atraumatic.  ?Cardiovascular:  ?   Rate and Rhythm: Tachycardia present.  ?Abdominal:  ?   Comments: Some both upper and lower abdominal tenderness.  No rebound or guarding.  No hernia palpated.  ?Skin: ?   General: Skin is warm.  ?   Capillary Refill: Capillary refill takes less than 2 seconds.  ?Neurological:  ?   Mental Status: She is alert and oriented to person, place, and time.  ? ? ?ED Results / Procedures / Treatments   ?Labs ?(all labs ordered are listed, but only abnormal results are displayed) ?Labs Reviewed  ?HCG, QUANTITATIVE, PREGNANCY - Abnormal; Notable for the following components:  ?    Result Value  ? hCG, Beta Chain, Quant, S 123 (*)   ? All other components within normal limits  ?COMPREHENSIVE METABOLIC PANEL - Abnormal; Notable for the following components:  ? Glucose, Bld 102 (*)   ? Creatinine, Ser 1.02 (*)   ? Alkaline Phosphatase 36 (*)   ? All other components within normal limits  ?CBC WITH DIFFERENTIAL/PLATELET - Abnormal; Notable for the following components:  ? Neutro Abs 8.0 (*)   ? All other components within normal limits  ?LIPASE, BLOOD  ? ? ?EKG ?EKG Interpretation ? ?Date/Time:  Friday November 25 2021 10:52:42 EDT ?Ventricular Rate:  97 ?PR Interval:  138 ?QRS Duration: 93 ?QT Interval:  362 ?QTC Calculation: 460 ?R Axis:   55 ?Text Interpretation: Sinus rhythm Borderline T wave abnormalities Confirmed by Davonna Belling [phone]) on 11/25/2021 11:04:48 AM ? ?Radiology ?No results found. ? ?Procedures ?Procedures  ? ? ?Medications Ordered in ED ?Medications - No data to  display ? ?ED Course/ Medical Decision Making/ A&P ?  ?                        ?Medical Decision Making ?Amount and/or Complexity of Data Reviewed ?Labs: ordered. ?Radiology: ordered. ? ? ?Patient abdominal pain both upper and lower and chest pain.  Woke up has been going for the last 5 days now.  She lab work reassuring.  No white count elevation.  Hemoglobin reassuring.  Did have a abortion about 10 days ago.  hCG elevated but likely coming down.  With abdominal tenderness in both upper and lower abdomen along with  shortness of breath with recent procedure will get CT angiography of chest and CT abdomen pelvis.  Care will be turned over Dr. Tyrone Nine ? ? ? ? ? ? ? ?Final Clinical Impression(s) / ED Diagnoses ?Final diagnoses:  ?None  ? ? ?Rx / DC Orders ?ED Discharge Orders   ? ? None  ? ?  ? ? ?  ?Davonna Belling, MD ?11/25/21 1528 ? ?

## 2021-11-25 NOTE — ED Notes (Signed)
The pt just returned from c-t  no food for the present ?

## 2021-11-25 NOTE — Discharge Instructions (Addendum)
Try pepcid or tagamet up to twice a day.  Try to avoid things that may make this worse, most commonly these are spicy foods tomato based products fatty foods chocolate and peppermint.  Alcohol and tobacco can also make this worse.  Return to the emergency department for sudden worsening pain fever or inability to eat or drink.  

## 2021-11-25 NOTE — ED Triage Notes (Signed)
Pt BIB [facility] from the drs office c/o abdominal pain and CP, along with difficulty swallowing x5 days. Pt has a hx of hypotension and SVT and decided to have an abortion 10 days ago. Pt received 324 ASA and 0.4 of nitro. Pt has a 22g in her left hand  ?

## 2021-11-25 NOTE — ED Notes (Signed)
To ct

## 2021-11-29 ENCOUNTER — Ambulatory Visit (INDEPENDENT_AMBULATORY_CARE_PROVIDER_SITE_OTHER): Payer: 59 | Admitting: Cardiology

## 2021-11-29 ENCOUNTER — Encounter: Payer: Self-pay | Admitting: Cardiology

## 2021-11-29 VITALS — BP 120/72 | HR 79 | Ht 69.0 in | Wt 243.8 lb

## 2021-11-29 DIAGNOSIS — I471 Supraventricular tachycardia: Secondary | ICD-10-CM

## 2021-11-29 DIAGNOSIS — I10 Essential (primary) hypertension: Secondary | ICD-10-CM | POA: Diagnosis not present

## 2021-11-29 NOTE — Patient Instructions (Signed)

## 2021-12-05 ENCOUNTER — Other Ambulatory Visit: Payer: Self-pay | Admitting: *Deleted

## 2021-12-05 MED ORDER — METOPROLOL TARTRATE 50 MG PO TABS: 75.0000 mg | ORAL_TABLET | Freq: Two times a day (BID) | ORAL | 2 refills | Status: DC

## 2021-12-05 NOTE — Telephone Encounter (Signed)
Per notation  on 11/05/21 metoprolol tartrate was increased to 7 mg twice a day - by Chriss Driver PA-C after discussing with Dr Jens Som. ? Patient saw Dr Jens Som  on 12/29/21. Medication was not changed ? ? Sent message to patient - metoprolol tartrate was refilled . The  Alprazolam was not refilled - patient needs to obtain refill from original provider. ? ?

## 2021-12-13 ENCOUNTER — Telehealth: Payer: Self-pay | Admitting: Home Health

## 2021-12-13 DIAGNOSIS — I471 Supraventricular tachycardia, unspecified: Secondary | ICD-10-CM | POA: Insufficient documentation

## 2021-12-13 NOTE — Progress Notes (Signed)
?Cardiology Office Note:   ? ?Date:  12/14/2021  ? ?ID:  Sheri Johnson, DOB Jun 02, 1986, MRN [id number] ? ?PCP:  Azzie Glatter, FNP  ?[facility] Providers ?Cardiologist:  Kirk Ruths, MD    ?Referring MD: Azzie Glatter, FNP  ? ?Chief Complaint:  Chest Pain and Shortness of Breath ?  ? ?Patient Profile: ?Paroxysmal Supraventricular Tachycardia  ?Dx 6/22 >> HR 231 >> Adenosine  ?Hx of inappropriate sinus tachycardia ?Rx: beta-blocker  ?Hypertension  ?Asthma  ?Hypothyroidism  ? ?Prior CV Studies: ?LONG TERM MONITOR (3-7 DAYS) INTERPRETATION 11/16/2021 ?Patient had a min HR of 60 bpm, max HR of 155 bpm, and avg HR of 91 bpm. Predominant underlying rhythm was Sinus Rhythm. Isolated SVEs were rare (<1.0%), and no SVE Couplets or SVE Triplets were present. Isolated VEs were rare (<1.0%), and no VE Couplets ?or VE Triplets were present. ? ?Summary and conclusions: Normal monitor ?  ?ECHO COMPLETE WO IMAGING ENHANCING AGENT 02/15/2021 ?EF 60-65, no RWMA, normal RVSF, mild LAE, trivial MR, no pericardial effusion   ? ?History of Present Illness:   ?Sheri Johnson is a 35 y.o. female with the above problem list.  She was last seen by Dr. Stanford Breed 11/29/21 with recurring symptoms of Supraventricular Tachycardia.  Her beta-blocker was continued and she was referred to EP.  She called the answering service last night with possible side effects to her beta-blocker.  The dose was reduced slightly and she was added to my schedule for further evaluation.    ? ?She is here alone.  She notes good results with atenolol when she was initially placed on this last year.  She did not know she was pregnant a few months ago when she started having recurring episodes of SVT.  Her atenolol was increased to twice daily which caused hypotension.  She was then switched to metoprolol.  Since starting on metoprolol, she has had worsening issues with asthma.  She has been more short of breath.  She has had to use her rescue inhaler  more.  She also notes difficulty sleeping as well as nightmares.  She has had hair loss.  All of her symptoms seem to be contributing to anxiety and she feels as though she is developing a panic disorder.  She notes fairly chronic chest discomfort described as tightness or heaviness.  She has difficulty laying flat but can turn on her side with improved symptoms.  She did have 1 episode of syncope a few weeks ago.  She had come in from walking and was able to sit down on the couch.  She seems to have had a vasovagal episode.  She went to the emergency room in March with chest discomfort.  Troponins were negative.  She also went to the emergency room in April with recurrent palpitations and ongoing chest discomfort.  Troponins were again negative.  CT was negative for pulmonary embolism.  There was no evidence of coronary calcification.    ?   ?Past Medical History:  ?Diagnosis Date  ? Abortion 11/14/2021  ? Anxiety   ? Asthma   ? Hypertension   ? Hypothyroid   ? SVT (supraventricular tachycardia) ([place])   ? ?Current Medications: ?Current Meds  ?Medication Sig  ? acetaminophen (TYLENOL) 325 MG tablet You can take 2 tablets every 6 hours for pain.  Your prescribed pain medicine also has Tylenol(acetaminophen) in it.  Count each one of those as a Tylenol tablet.  Do not take more than 4000 mg of Tylenol(acetaminophen) per  day.  ? ALPRAZolam (XANAX) 0.5 MG tablet Take 0.5 mg by mouth daily as needed.  ? atenolol (TENORMIN) 25 MG tablet Take 1 tablet by mouth in the morning, take 2 tablets by mouth in the evening (12 hours apart)  ? ATROVENT HFA 17 MCG/ACT inhaler Inhale 1 puff into the lungs as needed.  ? busPIRone (BUSPAR) 15 MG tablet Take 15 mg by mouth daily.  ? cetirizine (ZYRTEC) 10 MG tablet Take 10 mg by mouth daily.  ? cyclobenzaprine (FLEXERIL) 10 MG tablet Take 10 mg by mouth as needed.  ? diltiazem (CARDIZEM) 30 MG tablet Take 1 tablet (30 mg total) by mouth daily as needed (palpitations).  ? ibuprofen  (ADVIL,MOTRIN) 200 MG tablet You can take 2-3 tablets every 6 hours as needed for pain.  ? levothyroxine (SYNTHROID) 88 MCG tablet Take 88 mcg by mouth daily.  ? LINZESS 72 MCG capsule Take 72 mcg by mouth as needed (irritable bowel symptoms).  ? magnesium chloride (SLOW-MAG) 64 MG TBEC SR tablet Take 1 tablet (64 mg total) by mouth daily.  ? [DISCONTINUED] metoprolol tartrate (LOPRESSOR) 50 MG tablet Take 1.5 tablets (75 mg total) by mouth 2 (two) times daily. (Patient taking differently: Take 50 mg by mouth daily.)  ? [DISCONTINUED] metoprolol tartrate (LOPRESSOR) 50 MG tablet Take 50 mg by mouth at bedtime.  ? [DISCONTINUED] potassium chloride SA (KLOR-CON M) 20 MEQ tablet Take 1 tablet (20 mEq total) by mouth daily.  ?  ?Allergies:   Cipro [ciprofloxacin hcl] and Prednisone  ? ?Social History  ? ?Tobacco Use  ? Smoking status: Never  ? Smokeless tobacco: Never  ?Substance Use Topics  ? Alcohol use: Yes  ?  Comment: Rare  ?  ?Family Hx: ?The patient's family history includes Hypothyroidism in her mother. ? ?Review of Systems  ?Constitutional: Positive for weight gain.  ?Gastrointestinal:  Positive for bloating, abdominal pain and constipation.   ? ?EKGs/Labs/Other Test Reviewed:   ? ?EKG:  EKG is   ordered today.  The ekg ordered today demonstrates NSR, HR 85, normal axis, no ST-T wave changes, QTc 449 ? ?Recent Labs: ?10/16/2021: Magnesium 1.9 ?11/25/2021: ALT 18; BUN 10; Creatinine, Ser 1.02; Hemoglobin 13.7; Platelets 292; Potassium 4.4; Sodium 139  ? ?Recent Lipid Panel ?No results for input(s): CHOL, TRIG, HDL, VLDL, LDLCALC, LDLDIRECT in the last 8760 hours.  ? ?Risk Assessment/Calculations:   ?  ?    ?Physical Exam:   ? ?VS:  BP 124/82   Pulse 85   Ht 5\' 9"  (1.753 m)   Wt 239 lb (108.4 kg)   SpO2 98%   BMI 35.29 kg/m?    ? ?Wt Readings from Last 3 Encounters:  ?12/14/21 239 lb (108.4 kg)  ?11/29/21 243 lb 12.8 oz (110.6 kg)  ?11/25/21 240 lb (108.9 kg)  ?  ?Constitutional:   ?   Appearance: Healthy  appearance. Not in distress.  ?Neck:  ?   Vascular: No JVR. JVD normal.  ?Pulmonary:  ?   Effort: Pulmonary effort is normal.  ?   Breath sounds: No wheezing. No rales.  ?Cardiovascular:  ?   Normal rate. Regular rhythm. Normal S1. Normal S2.   ?   Murmurs: There is no murmur.  ?   No rub.  ?Edema: ?   Peripheral edema absent.  ?Abdominal:  ?   Palpations: Abdomen is soft.  ?Skin: ?   General: Skin is warm and dry.  ?Neurological:  ?   Mental Status: Alert and oriented to  person, place and time.  ?   Cranial Nerves: Cranial nerves are intact.  ?  ?    ?ASSESSMENT & PLAN:   ?Precordial chest pain ?She has had a multitude of symptoms since starting on Metoprolol Tartrate.  She has asthma and she has been more short of breath requiring the use of her rescue inhaler more and more.  She has also had fairly constant chest tightness/heaviness over the past couple of months.  She went to the emergency room in March.  Troponins were negative.  She also went to the emergency room with recurrent palpitations in April.  She also had chest discomfort at that time.  Chest CT was negative for pulmonary embolism.  There is no mention of coronary calcifications on that study.  She has also had issues with hair loss, nightmares.  All of her symptoms seem to be creating more more issues with anxiety.  She had none of the symptoms when she was on metoprolol.  In retrospect, she thinks that her pregnancy was likely contributing to more episodes of SVT.  At this point, I think her chest discomfort and shortness of breath are all side effects related to beta-blocker therapy.  I do not think she needs to undergo stress testing at this time.  As noted, I will change her therapy.  She knows to contact me if her symptoms do not improve.  At that point, I would consider proceeding with stress echocardiogram. ? ?SVT (supraventricular tachycardia) ([place]) ?She started having worsening episodes of SVT around the time she became pregnant.  She has  subsequently had pregnancy termination.  She has only had 1 further episode of SVT this occurred prior to pregnancy termination.  Atenolol seem to work well for her in the past.  The dose was increased when she had more

## 2021-12-13 NOTE — Telephone Encounter (Signed)
Patient called after-hours line today reporting significant side effects from taking metoprolol.  Patient states that she was started on metoprolol a few months back for SVT.  She has noted significant worsening of anxiety, shortness of breath, heart palpitation, cold hands and feet, GI disturbance, as well as sleep disturbance.  She is afraid she would stop breathing while taking the medication.  She noted symptom only occurs after she takes her metoprolol.  She also noted her heart rate dropped significantly after taking the medication.  Advised patient that she is okay to reduce metoprolol to 50mg  tonight from 75mg , may still experience some adverse effect tonight, some symptoms are unlikely related to beta blocker. Appt arranged with Nicki Reaper PA tomorrow 12/14/21 at 11:20AM, there is no open appointment at [facility] office for the next 2 weeks and she is willing to see any provider who has immediate openings, may consider transition to PO CCB for SVT while waiting for EP evaluation on 12/26/21. Patient is agreeable with appointment, all questions answered.  ?

## 2021-12-14 ENCOUNTER — Encounter: Payer: Self-pay | Admitting: Physician Assistant

## 2021-12-14 ENCOUNTER — Ambulatory Visit (INDEPENDENT_AMBULATORY_CARE_PROVIDER_SITE_OTHER): Payer: 59 | Admitting: Physician Assistant

## 2021-12-14 VITALS — BP 124/82 | HR 85 | Ht 69.0 in | Wt 239.0 lb

## 2021-12-14 DIAGNOSIS — I471 Supraventricular tachycardia: Secondary | ICD-10-CM | POA: Diagnosis not present

## 2021-12-14 DIAGNOSIS — R002 Palpitations: Secondary | ICD-10-CM | POA: Insufficient documentation

## 2021-12-14 DIAGNOSIS — R079 Chest pain, unspecified: Secondary | ICD-10-CM | POA: Insufficient documentation

## 2021-12-14 DIAGNOSIS — R072 Precordial pain: Secondary | ICD-10-CM | POA: Diagnosis not present

## 2021-12-14 DIAGNOSIS — I10 Essential (primary) hypertension: Secondary | ICD-10-CM | POA: Diagnosis not present

## 2021-12-14 MED ORDER — POTASSIUM CHLORIDE CRYS ER 20 MEQ PO TBCR: 20.0000 meq | EXTENDED_RELEASE_TABLET | Freq: Every day | ORAL | 6 refills | Status: DC

## 2021-12-14 MED ORDER — ATENOLOL 25 MG PO TABS: ORAL_TABLET | ORAL | 1 refills | Status: DC

## 2021-12-14 MED ORDER — DILTIAZEM HCL 30 MG PO TABS: 30.0000 mg | ORAL_TABLET | Freq: Every day | ORAL | 1 refills | Status: DC | PRN

## 2021-12-14 NOTE — Assessment & Plan Note (Signed)
She started having worsening episodes of SVT around the time she became pregnant.  She has subsequently had pregnancy termination.  She has only had 1 further episode of SVT this occurred prior to pregnancy termination.  Atenolol seem to work well for her in the past.  The dose was increased when she had more SVT.  This coincided with her pregnancy.  Her blood pressure dropped too low with this.  She was changed to metoprolol at that time.  We had a long discussion about changing her from beta-blocker therapy to calcium channel blocker therapy.  She would prefer to go back on atenolol which seem to work in the past.  I think this is reasonable.  She has an evaluation with the EP in the next couple of weeks.  We also discussed taking diltiazem as needed for breakthrough episodes of SVT. ?? Discontinue metoprolol tartrate ?? Start atenolol 25 mg in the morning, 50 mg in the evening ?? Continue with vagal maneuvers as needed for recurrent Supraventricular Tachycardia ?? Rx for diltiazem 30 mg daily as needed for persistent palpitations ?? Keep appointment with Dr. Lovena Le later this month for evaluation of SVT ?

## 2021-12-14 NOTE — Patient Instructions (Addendum)
Medication Instructions:  ?Your physician has recommended you make the following change in your medication:  ? STOP Metoprolol ? START Atenolol 25 mg taking 1 tablet in the mornings and 2 tablets in the evening 12 hours apart.  You can start this tonight ? START Cardizem 30 mg taking 1 daily only as needed for palpitations  ? ?*If you need a refill on your cardiac medications before your next appointment, please call your pharmacy* ? ? ?Lab Work: ?None ordered ? ?If you have labs (blood work) drawn today and your tests are completely normal, you will receive your results only by: ?MyChart Message (if you have MyChart) OR ?A paper copy in the mail ?If you have any lab test that is abnormal or we need to change your treatment, we will call you to review the results. ? ? ?Testing/Procedures: ?None ordered ? ? ?Follow-Up: ?At [facility], you and your health needs are our priority.  As part of our continuing mission to provide you with exceptional heart care, we have created designated Provider Care Teams.  These Care Teams include your primary Cardiologist (physician) and Advanced Practice Providers (APPs -  Physician Assistants and Nurse Practitioners) who all work together to provide you with the care you need, when you need it. ? ?We recommend signing up for the patient portal called "MyChart".  Sign up information is provided on this After Visit Summary.  MyChart is used to connect with patients for Virtual Visits (Telemedicine).  Patients are able to view lab/test results, encounter notes, upcoming appointments, etc.  Non-urgent messages can be sent to your provider as well.   ?To learn more about what you can do with MyChart, go to [email or web address].   ? ?Your next appointment:   ?As Scheduled ? ?The format for your next appointment:   ?In Person ? ?Provider:   ?Lewayne Bunting, MD  ? ? ?Other Instructions ? ? ?Important Information About Sugar ? ? ? ? ?  ?

## 2021-12-14 NOTE — Assessment & Plan Note (Signed)
She has had a multitude of symptoms since starting on Metoprolol Tartrate.  She has asthma and she has been more short of breath requiring the use of her rescue inhaler more and more.  She has also had fairly constant chest tightness/heaviness over the past couple of months.  She went to the emergency room in March.  Troponins were negative.  She also went to the emergency room with recurrent palpitations in April.  She also had chest discomfort at that time.  Chest CT was negative for pulmonary embolism.  There is no mention of coronary calcifications on that study.  She has also had issues with hair loss, nightmares.  All of her symptoms seem to be creating more more issues with anxiety.  She had none of the symptoms when she was on metoprolol.  In retrospect, she thinks that her pregnancy was likely contributing to more episodes of SVT.  At this point, I think her chest discomfort and shortness of breath are all side effects related to beta-blocker therapy.  I do not think she needs to undergo stress testing at this time.  As noted, I will change her therapy.  She knows to contact me if her symptoms do not improve.  At that point, I would consider proceeding with stress echocardiogram. ?

## 2021-12-14 NOTE — Assessment & Plan Note (Signed)
Blood pressure is well controlled.  Change metoprolol to atenolol as noted.  She will monitor her blood pressure for any significant changes. ?

## 2021-12-19 ENCOUNTER — Emergency Department (HOSPITAL_BASED_OUTPATIENT_CLINIC_OR_DEPARTMENT_OTHER): Payer: 59

## 2021-12-19 ENCOUNTER — Emergency Department (HOSPITAL_BASED_OUTPATIENT_CLINIC_OR_DEPARTMENT_OTHER)
Admission: EM | Admit: 2021-12-19 | Discharge: 2021-12-19 | Disposition: A | Discharge status: Home / Self Care | Payer: 59 | Attending: Emergency Medicine | Admitting: Emergency Medicine

## 2021-12-19 ENCOUNTER — Other Ambulatory Visit: Payer: Self-pay

## 2021-12-19 ENCOUNTER — Encounter (HOSPITAL_BASED_OUTPATIENT_CLINIC_OR_DEPARTMENT_OTHER): Payer: Self-pay | Admitting: Emergency Medicine

## 2021-12-19 DIAGNOSIS — K5792 Diverticulitis of intestine, part unspecified, without perforation or abscess without bleeding: Secondary | ICD-10-CM

## 2021-12-19 DIAGNOSIS — I471 Supraventricular tachycardia: Secondary | ICD-10-CM | POA: Diagnosis not present

## 2021-12-19 DIAGNOSIS — R112 Nausea with vomiting, unspecified: Secondary | ICD-10-CM | POA: Diagnosis present

## 2021-12-19 DIAGNOSIS — K5732 Diverticulitis of large intestine without perforation or abscess without bleeding: Secondary | ICD-10-CM | POA: Diagnosis not present

## 2021-12-19 LAB — COMPREHENSIVE METABOLIC PANEL
ALT: 21 U/L (ref 0–44)
AST: 19 U/L (ref 15–41)
Albumin: 4.3 g/dL (ref 3.5–5.0)
Alkaline Phosphatase: 41 U/L (ref 38–126)
Anion gap: 8 (ref 5–15)
BUN: 11 mg/dL (ref 6–20)
CO2: 22 mmol/L (ref 22–32)
Calcium: 8.9 mg/dL (ref 8.9–10.3)
Chloride: 106 mmol/L (ref 98–111)
Creatinine, Ser: 1.02 mg/dL — ABNORMAL HIGH (ref 0.44–1.00)
GFR, Estimated: >60 mL/min (ref >60)
Glucose, Bld: 120 mg/dL — ABNORMAL HIGH (ref 70–99)
Potassium: 3.8 mmol/L (ref 3.5–5.1)
Sodium: 136 mmol/L (ref 135–145)
Total Bilirubin: 0.9 mg/dL (ref 0.3–1.2)
Total Protein: 7.8 g/dL (ref 6.5–8.1)

## 2021-12-19 LAB — CBC
HCT: 41.3 % (ref 36.0–46.0)
Hemoglobin: 14.3 g/dL (ref 12.0–15.0)
MCH: 28 pg (ref 26.0–34.0)
MCHC: 34.6 g/dL (ref 30.0–36.0)
MCV: 80.8 fL (ref 80.0–100.0)
Platelets: 273 10*3/uL (ref 150–400)
RBC: 5.11 MIL/uL (ref 3.87–5.11)
RDW: 13.5 % (ref 11.5–15.5)
WBC: 8.8 10*3/uL (ref 4.0–10.5)
nRBC: 0 % (ref 0.0–0.2)

## 2021-12-19 LAB — TROPONIN I (HIGH SENSITIVITY): Troponin I (High Sensitivity): <2 ng/L (ref <18)

## 2021-12-19 MED ORDER — AMOXICILLIN-POT CLAVULANATE 875-125 MG PO TABS: 1.0000 | ORAL_TABLET | Freq: Two times a day (BID) | ORAL | 0 refills | Status: DC

## 2021-12-19 NOTE — ED Triage Notes (Signed)
Pt having palpitations since last night  states has had n/v/d since yesterday  all day  ?

## 2021-12-19 NOTE — ED Provider Notes (Signed)
?[facility] EMERGENCY DEPARTMENT ?Provider Note ? ? ?CSN: [id number] ?Arrival date & time: 12/19/21  1443 ? ?  ? ?History ? ?Chief Complaint  ?Patient presents with  ? Palpitations  ? ? ?Sheri Johnson is a 36 y.o. female. ? ?HPI ? ?  ? ?36 year old female comes in with chief complaint of palpitations. ? ?Patient's main complaint is palpitations with left-sided chest discomfort.  Palpitations started last night.  She also had nausea, vomiting and diarrhea yesterday, but has hydrated well.  This morning, after her shower, she started feeling that her heart was racing and then her symptoms got worse.  She checked her heart rate, it was in the 160s and she called EMS.  By the time she arrived to the ER, her heart rate improved. ? ?Patient indicates that for PSVT, she is supposed to follow-up with cardiology for consideration for ablation.  Over the last year she has been having recurrent episodes of palpitations. ? ?She also mentions left-sided numbness over the upper extremity and lower extremity, primarily over the toes and fingers.  She also feels a little bit of heaviness over the left side and some weakness.  She had MRI done which was negative in March. ? ?Additionally, she is also having some GI symptoms.  Patient has had recurrent nausea, vomiting, diarrhea.  She also has some abdominal discomfort.  She had colonoscopy done few years back and CT angiogram of the chest and CT with abdomen and pelvis done earlier this month which was negative for any acute pathology. ? ?She indicates that she has had diverticulitis in the past, usually her GI symptoms do not cause the left lower quadrant pain that she is having.  She denies any gynecologic issues. ? ?Finally, patient also indicates that she has had recurrent sinus issues in the last few months, and there was some mold exposure possibly.  Patient also indicates there hot showers has made her symptoms worse.  And there is also some nonspecific skin  eruptions that occur, worse after showers. ? ?Home Medications ?Prior to Admission medications   ?Medication Sig Start Date End Date Taking? Authorizing Provider  ?amoxicillin-clavulanate (AUGMENTIN) 875-125 MG tablet Take 1 tablet by mouth every 12 (twelve) hours. 12/19/21  Yes Derwood Kaplan, MD  ?acetaminophen (TYLENOL) 325 MG tablet You can take 2 tablets every 6 hours for pain.  Your prescribed pain medicine also has Tylenol(acetaminophen) in it.  Count each one of those as a Tylenol tablet.  Do not take more than 4000 mg of Tylenol(acetaminophen) per day. 12/15/16   Sherrie George, PA-C  ?ALPRAZolam (XANAX) 0.5 MG tablet Take 0.5 mg by mouth daily as needed. 07/07/21   [provider]  ?atenolol (TENORMIN) 25 MG tablet Take 1 tablet by mouth in the morning, take 2 tablets by mouth in the evening (12 hours apart) 12/14/21   Tereso Newcomer T, PA-C  ?ATROVENT HFA 17 MCG/ACT inhaler Inhale 1 puff into the lungs as needed. 08/23/21   [provider]  ?busPIRone (BUSPAR) 15 MG tablet Take 15 mg by mouth daily.    [provider]  ?cetirizine (ZYRTEC) 10 MG tablet Take 10 mg by mouth daily.    [provider]  ?cyclobenzaprine (FLEXERIL) 10 MG tablet Take 10 mg by mouth as needed. 07/07/21   [provider]  ?diltiazem (CARDIZEM) 30 MG tablet Take 1 tablet (30 mg total) by mouth daily as needed (palpitations). 12/14/21   Tereso Newcomer T, PA-C  ?ibuprofen (ADVIL,MOTRIN) 200 MG tablet You  can take 2-3 tablets every 6 hours as needed for pain. 12/15/16   Sherrie GeorgeJennings, Willard, PA-C  ?levothyroxine (SYNTHROID) 88 MCG tablet Take 88 mcg by mouth daily. 01/02/21   [provider]  ?Karlene EinsteinLINZESS 72 MCG capsule Take 72 mcg by mouth as needed (irritable bowel symptoms). 10/13/21   [provider]  ?magnesium chloride (SLOW-MAG) 64 MG TBEC SR tablet Take 1 tablet (64 mg total) by mouth daily. 10/17/21   Dione BoozeGlick, David, MD  ?potassium chloride SA (KLOR-CON M) 20 MEQ tablet Take 1  tablet (20 mEq total) by mouth daily. 12/14/21   Tereso NewcomerWeaver, Scott T, PA-C  ?   ? ?Allergies    ?Cipro [ciprofloxacin hcl] and Prednisone   ? ?Review of Systems   ?Review of Systems  ?All other systems reviewed and are negative. ? ?Physical Exam ?Updated Vital Signs ?BP 115/68   Pulse 81   Temp 98.7 ?F (37.1 ?C) (Oral)   Resp 16   Ht 5\' 7"  (1.702 m)   Wt 108 kg   SpO2 98%   BMI 37.29 kg/m?  ?Physical Exam ?Vitals and nursing note reviewed.  ?Constitutional:   ?   Appearance: She is well-developed.  ?HENT:  ?   Head: Atraumatic.  ?Cardiovascular:  ?   Rate and Rhythm: Normal rate.  ?Pulmonary:  ?   Effort: Pulmonary effort is normal.  ?Abdominal:  ?   Tenderness: There is abdominal tenderness. There is guarding. There is no rebound.  ?Musculoskeletal:  ?   Cervical back: Normal range of motion and neck supple.  ?Skin: ?   General: Skin is warm and dry.  ?Neurological:  ?   Mental Status: She is alert and oriented to person, place, and time.  ? ? ?ED Results / Procedures / Treatments   ?Labs ?(all labs ordered are listed, but only abnormal results are displayed) ?Labs Reviewed  ?COMPREHENSIVE METABOLIC PANEL - Abnormal; Notable for the following components:  ?    Result Value  ? Glucose, Bld 120 (*)   ? Creatinine, Ser 1.02 (*)   ? All other components within normal limits  ?CBC  ?PREGNANCY, URINE  ?TROPONIN I (HIGH SENSITIVITY)  ?TROPONIN I (HIGH SENSITIVITY)  ? ? ?EKG ?EKG Interpretation ? ?Date/Time:  Monday Dec 19 2021 08:20:09 EDT ?Ventricular Rate:  98 ?PR Interval:  139 ?QRS Duration: 92 ?QT Interval:  374 ?QTC Calculation: 478 ?R Axis:   77 ?Text Interpretation: Sinus rhythm Borderline T abnormalities, inferior leads No acute changes No significant change since last tracing Confirmed by Derwood KaplanNanavati, Joeli Fenner [phone] on 12/19/2021 9:15:08 AM ? ?Radiology ?DG Chest 2 View ? ?Result Date: 12/19/2021 ?CLINICAL DATA:  Chest pain, nausea, and vomiting. EXAM: CHEST - 2 VIEW COMPARISON:  Chest x-ray dated October 16, 2021.  FINDINGS: The heart size and mediastinal contours are within normal limits. Both lungs are clear. The visualized skeletal structures are unremarkable. IMPRESSION: No active cardiopulmonary disease. Electronically Signed   By: Obie DredgeWilliam T Derry M.D.   On: 12/19/2021 08:45   ? ?Procedures ?Procedures  ? ? ?Medications Ordered in ED ?Medications - No data to display ? ?ED Course/ Medical Decision Making/ A&P ?  ?                        ?Medical Decision Making ?Problems Addressed: ?Diverticulitis: acute illness or injury ?PSVT (paroxysmal supraventricular tachycardia) (HCC): acute illness or injury with systemic symptoms ? ?Amount and/or Complexity of Data Reviewed ?External Data Reviewed: notes. ?Labs: ordered. ?Radiology: ordered. ? ?  Risk ?Prescription drug management. ? ? ?This patient presents to the ED with chief complaint(s) of palpitations, chest discomfort, but she also has multiple other complaints/concerns with pertinent past medical history of appendectomy which further complicates the presenting complaint. The complaint involves an extensive differential diagnosis and also carries with it a high risk of complications and morbidity.   ? ?The differential diagnosis includes : ?It appears that patient was in PSVT at home.  Currently however she is in sinus tachycardia.  Patient is already on atenolol for her sinus tachycardia.  While in the ER, patient actually is back in normal sinus rhythm with heart rate in the 80s.  ? ?We placed patient on cardiac monitoring for extended period of the time given the nature of her symptoms and concerns of arrhythmia.  I have personally reviewed and interpreted the rhythm strip, patient does not have any interval issues and she did not have any tachydysrhythmia while in the ED.  ? ?She has an appointment coming up with cardiologist in May, will request expectant management. ? ?Additionally, patient indicates that she has had many other symptoms affecting different organs.  The  symptoms include GI issues, skin changes, flushing/feeling warm, numbness over her toes and digits and also some weakness. ? ?I have reviewed patient's previous work-up.  I have reviewed patient's CT angiogram chest and

## 2021-12-19 NOTE — Discharge Instructions (Addendum)
We signed the ER for palpitations and we also discussed your symptoms of chest discomfort, left-sided numbness, GI issues. ? ?As discussed, there are a lot of possibilities and we encourage continued follow-up with your primary care doctor and specialist for further evaluation. ? ?We did discuss systemic conditions like "mastocytosis" that can present with palpitations, GI issues and skin changes -something to discuss with your primary care doctor to see if they feel further testing in that area is needed ? ?Given the left lower quadrant pain, we are prescribing you Augmentin for suspected diverticulitis. ?

## 2021-12-26 ENCOUNTER — Ambulatory Visit (INDEPENDENT_AMBULATORY_CARE_PROVIDER_SITE_OTHER): Payer: 59 | Admitting: Internal Medicine

## 2021-12-26 ENCOUNTER — Encounter: Payer: Self-pay | Admitting: *Deleted

## 2021-12-26 ENCOUNTER — Encounter: Payer: Self-pay | Admitting: Internal Medicine

## 2021-12-26 VITALS — BP 122/82 | HR 87 | Ht 69.0 in | Wt 241.9 lb

## 2021-12-26 DIAGNOSIS — Z01818 Encounter for other preprocedural examination: Secondary | ICD-10-CM

## 2021-12-26 DIAGNOSIS — I471 Supraventricular tachycardia: Secondary | ICD-10-CM | POA: Diagnosis not present

## 2021-12-26 NOTE — Progress Notes (Signed)
        HPI [name] is referred by Dr. BC for evaluation of SVT. The patient is a pleasant 36 yo woman with a h/o SVT which has responded to IV adenosine in the past. She also has a h/o HTN and has been treated in the past with atenolol. She saw Dr. BC in April. She has worn a cardiac monitor which was unrevealing. She has had documented SVT at 230/min.      Allergies  Allergen Reactions   Cipro [Ciprofloxacin Hcl] Palpitations   Prednisone Palpitations              Current Outpatient Medications  Medication Sig Dispense Refill   acetaminophen (TYLENOL) 325 MG tablet You can take 2 tablets every 6 hours for pain.  Your prescribed pain medicine also has Tylenol(acetaminophen) in it.  Count each one of those as a Tylenol tablet.  Do not take more than 4000 mg of Tylenol(acetaminophen) per day. 100 tablet 2   ALPRAZolam (XANAX) 0.5 MG tablet Take 0.5 mg by mouth daily as needed.       amoxicillin-clavulanate (AUGMENTIN) 875-125 MG tablet Take 1 tablet by mouth every 12 (twelve) hours. 14 tablet 0   atenolol (TENORMIN) 25 MG tablet Take 1 tablet by mouth in the morning, take 2 tablets by mouth in the evening (12 hours apart) 270 tablet 1   ATROVENT HFA 17 MCG/ACT inhaler Inhale 1 puff into the lungs as needed.       busPIRone (BUSPAR) 15 MG tablet Take 15 mg by mouth daily.       cetirizine (ZYRTEC) 10 MG tablet Take 10 mg by mouth daily.       cyclobenzaprine (FLEXERIL) 10 MG tablet Take 10 mg by mouth as needed.       diltiazem (CARDIZEM) 30 MG tablet Take 1 tablet (30 mg total) by mouth daily as needed (palpitations). 30 tablet 1   ibuprofen (ADVIL,MOTRIN) 200 MG tablet You can take 2-3 tablets every 6 hours as needed for pain.       levothyroxine (SYNTHROID) 88 MCG tablet Take 88 mcg by mouth daily.       LINZESS 72 MCG capsule Take 72 mcg by mouth as needed (irritable bowel symptoms).       magnesium chloride (SLOW-MAG) 64 MG TBEC SR tablet Take 1 tablet (64 mg total) by mouth  daily. 30 tablet 0   potassium chloride SA (KLOR-CON M) 20 MEQ tablet Take 1 tablet (20 mEq total) by mouth daily. 30 tablet 6    No current facility-administered medications for this visit.            Past Medical History:  Diagnosis Date   Abortion 11/14/2021   Anxiety     Asthma     Hypertension     Hypothyroid     SVT (supraventricular tachycardia) (HCC)        ROS:    All systems reviewed and negative except as noted in the HPI.          Past Surgical History:  Procedure Laterality Date   LAPAROSCOPIC APPENDECTOMY N/A 12/14/2016    Procedure: APPENDECTOMY LAPAROSCOPIC;  Surgeon: Toth, Paul III, MD;  Location: [facility] OR;  Service: General;  Laterality: N/A;             Family History  Problem Relation Age of Onset   Hypothyroidism Mother          Social History           Socioeconomic History   Marital status: Single      Spouse name: Not on file   Number of children: 3   Years of education: Not on file   Highest education level: Not on file  Occupational History   Not on file  Tobacco Use   Smoking status: Never   Smokeless tobacco: Never  Substance and Sexual Activity   Alcohol use: Yes      Comment: Rare   Drug use: Not on file   Sexual activity: Not on file  Other Topics Concern   Not on file  Social History Narrative   Not on file    Social Determinants of Health    Financial Resource Strain: Not on file  Food Insecurity: Not on file  Transportation Needs: Not on file  Physical Activity: Not on file  Stress: Not on file  Social Connections: Not on file  Intimate Partner Violence: Not on file        There were no vitals taken for this visit.   Physical Exam:   Well appearing NAD HEENT: Unremarkable Neck:  No JVD, no thyromegally Lymphatics:  No adenopathy Back:  No CVA tenderness Lungs:  Clear with no wheezes HEART:  Regular rate rhythm, no murmurs, no rubs, no clicks Abd:  soft, positive bowel sounds, no organomegally, no rebound,  no guarding Ext:  2 plus pulses, no edema, no cyanosis, no clubbing Skin:  No rashes no nodules Neuro:  CN II through XII intact, motor grossly intact   EKG - nsr with no pre-excitation   DEVICE  Normal device function.  See PaceArt for details.    Assess/Plan:  Recurrent SVT - I have discussed the treatment options with the patient and recommend proceeding with EP study and ablation. She will call us if she would like to proceed. Obesity - after her ablation she will need to work on her weight.   Makoto Sellitto,MD 

## 2021-12-26 NOTE — Patient Instructions (Addendum)
Medication Instructions:  Your physician recommends that you continue on your current medications as directed. Please refer to the Current Medication list given to you today. *If you need a refill on your cardiac medications before your next appointment, please call your pharmacy*  Lab Work: CBC, BMP  If you have labs (blood work) drawn today and your tests are completely normal, you will receive your results only by: [place] (if you have MyChart) OR A paper copy in the mail If you have any lab test that is abnormal or we need to change your treatment, we will call you to review the results.  Testing/Procedures: Your physician has recommended that you have an ablation. Catheter ablation is a medical procedure used to treat some cardiac arrhythmias (irregular heartbeats). During catheter ablation, a long, thin, flexible tube is put into a blood vessel in your groin (upper thigh), or neck. This tube is called an ablation catheter. It is then guided to your heart through the blood vessel. Radio frequency waves destroy small areas of heart tissue where abnormal heartbeats may cause an arrhythmia to start. Please see the instruction sheet given to you today.   Follow-Up: At [facility], you and your health needs are our priority.  As part of our continuing mission to provide you with exceptional heart care, we have created designated Provider Care Teams.  These Care Teams include your primary Cardiologist (physician) and Advanced Practice Providers (APPs -  Physician Assistants and Nurse Practitioners) who all work together to provide you with the care you need, when you need it.  Your physician wants you to follow-up in: see instruction letter.   We recommend signing up for the patient portal called "MyChart".  Sign up information is provided on this After Visit Summary.  MyChart is used to connect with patients for Virtual Visits (Telemedicine).  Patients are able to view lab/test  results, encounter notes, upcoming appointments, etc.  Non-urgent messages can be sent to your provider as well.   To learn more about what you can do with MyChart, go to [email or web address].    Any Other Special Instructions Will Be Listed Below (If Applicable).  Cardiac Ablation Cardiac ablation is a procedure to destroy (ablate) some heart tissue that is sending bad signals. These bad signals cause problems in heart rhythm. The heart has many areas that make these signals. If there are problems in these areas, they can make the heart beat in a way that is not normal. Destroying some tissues can help make the heart rhythm normal. Tell your doctor about: Any allergies you have. All medicines you are taking. These include vitamins, herbs, eye drops, creams, and over-the-counter medicines. Any problems you or family members have had with medicines that make you fall asleep (anesthetics). Any blood disorders you have. Any surgeries you have had. Any medical conditions you have, such as kidney failure. Whether you are pregnant or may be pregnant. What are the risks? This is a safe procedure. But problems may occur, including: Infection. Bruising and bleeding. Bleeding into the chest. Stroke or blood clots. Damage to nearby areas of your body. Allergies to medicines or dyes. The need for a pacemaker if the normal system is damaged. Failure of the procedure to treat the problem. What happens before the procedure? Medicines Ask your doctor about: Changing or stopping your normal medicines. This is important. Taking aspirin and ibuprofen. Do not take these medicines unless your doctor tells you to take them. Taking other medicines, vitamins, herbs, and  supplements. General instructions Follow instructions from your doctor about what you cannot eat or drink. Plan to have someone take you home from the hospital or clinic. If you will be going home right after the procedure, plan to have  someone with you for 24 hours. Ask your doctor what steps will be taken to prevent infection. What happens during the procedure?  An IV tube will be put into one of your veins. You will be given a medicine to help you relax. The skin on your neck or groin will be numbed. A cut (incision) will be made in your neck or groin. A needle will be put through your cut and into a large vein. A tube (catheter) will be put into the needle. The tube will be moved to your heart. Dye may be put through the tube. This helps your doctor see your heart. Small devices (electrodes) on the tube will send out signals. A type of energy will be used to destroy some heart tissue. The tube will be taken out. Pressure will be held on your cut. This helps stop bleeding. A bandage will be put over your cut. The exact procedure may vary among doctors and hospitals. What happens after the procedure? You will be watched until you leave the hospital or clinic. This includes checking your heart rate, breathing rate, oxygen, and blood pressure. Your cut will be watched for bleeding. You will need to lie still for a few hours. Do not drive for 24 hours or as long as your doctor tells you. Summary Cardiac ablation is a procedure to destroy some heart tissue. This is done to treat heart rhythm problems. Tell your doctor about any medical conditions you may have. Tell him or her about all medicines you are taking to treat them. This is a safe procedure. But problems may occur. These include infection, bruising, bleeding, and damage to nearby areas of your body. Follow what your doctor tells you about food and drink. You may also be told to change or stop some of your medicines. After the procedure, do not drive for 24 hours or as long as your doctor tells you. This information is not intended to replace advice given to you by your health care provider. Make sure you discuss any questions you have with your health care  provider. Document Revised: 06/26/2019 Document Reviewed: 06/26/2019 Elsevier Patient Education  [place].

## 2021-12-27 LAB — CBC WITH DIFFERENTIAL/PLATELET
Basophils Absolute: 0 10*3/uL (ref 0.0–0.2)
Basos: 0 %
EOS (ABSOLUTE): 0.5 10*3/uL — ABNORMAL HIGH (ref 0.0–0.4)
Eos: 6 %
Hematocrit: 41.9 % (ref 34.0–46.6)
Hemoglobin: 14.4 g/dL (ref 11.1–15.9)
Immature Grans (Abs): 0 10*3/uL (ref 0.0–0.1)
Immature Granulocytes: 0 %
Lymphocytes Absolute: 2.8 10*3/uL (ref 0.7–3.1)
Lymphs: 37 %
MCH: 28.1 pg (ref 26.6–33.0)
MCHC: 34.4 g/dL (ref 31.5–35.7)
MCV: 82 fL (ref 79–97)
Monocytes Absolute: 0.6 10*3/uL (ref 0.1–0.9)
Monocytes: 8 %
Neutrophils Absolute: 3.7 10*3/uL (ref 1.4–7.0)
Neutrophils: 49 %
Platelets: 304 10*3/uL (ref 150–450)
RBC: 5.12 x10E6/uL (ref 3.77–5.28)
RDW: 14.1 % (ref 11.7–15.4)
WBC: 7.7 10*3/uL (ref 3.4–10.8)

## 2021-12-27 LAB — BASIC METABOLIC PANEL
BUN/Creatinine Ratio: 11 (ref 9–23)
BUN: 10 mg/dL (ref 6–20)
CO2: 24 mmol/L (ref 20–29)
Calcium: 9.5 mg/dL (ref 8.7–10.2)
Chloride: 102 mmol/L (ref 96–106)
Creatinine, Ser: 0.95 mg/dL (ref 0.57–1.00)
Glucose: 112 mg/dL — ABNORMAL HIGH (ref 70–99)
Potassium: 4.5 mmol/L (ref 3.5–5.2)
Sodium: 139 mmol/L (ref 134–144)
eGFR: 79 mL/min/{1.73_m2} (ref >59)

## 2022-01-09 ENCOUNTER — Encounter: Payer: Self-pay | Admitting: Internal Medicine

## 2022-01-17 ENCOUNTER — Telehealth: Payer: Self-pay

## 2022-01-17 NOTE — Telephone Encounter (Signed)
Pt aware her procedure has been denied at this point, and we are in the process of appealing this ruling.  Advised Pt to restart her medications.  Advised would follow up next week with status of appeal.  Apologized for the inconvenience.

## 2022-01-19 ENCOUNTER — Emergency Department (HOSPITAL_BASED_OUTPATIENT_CLINIC_OR_DEPARTMENT_OTHER)
Admission: EM | Admit: 2022-01-19 | Discharge: 2022-01-19 | Disposition: A | Discharge status: Home / Self Care | Payer: 59 | Attending: Emergency Medicine | Admitting: Emergency Medicine

## 2022-01-19 ENCOUNTER — Emergency Department (HOSPITAL_BASED_OUTPATIENT_CLINIC_OR_DEPARTMENT_OTHER): Payer: 59

## 2022-01-19 ENCOUNTER — Other Ambulatory Visit: Payer: Self-pay

## 2022-01-19 ENCOUNTER — Encounter (HOSPITAL_BASED_OUTPATIENT_CLINIC_OR_DEPARTMENT_OTHER): Payer: Self-pay

## 2022-01-19 DIAGNOSIS — R072 Precordial pain: Secondary | ICD-10-CM | POA: Diagnosis not present

## 2022-01-19 DIAGNOSIS — M549 Dorsalgia, unspecified: Secondary | ICD-10-CM | POA: Diagnosis not present

## 2022-01-19 DIAGNOSIS — R0789 Other chest pain: Secondary | ICD-10-CM | POA: Diagnosis present

## 2022-01-19 LAB — CBC
HCT: 42.4 % (ref 36.0–46.0)
Hemoglobin: 14.8 g/dL (ref 12.0–15.0)
MCH: 28.2 pg (ref 26.0–34.0)
MCHC: 34.9 g/dL (ref 30.0–36.0)
MCV: 80.9 fL (ref 80.0–100.0)
Platelets: 285 10*3/uL (ref 150–400)
RBC: 5.24 MIL/uL — ABNORMAL HIGH (ref 3.87–5.11)
RDW: 13.6 % (ref 11.5–15.5)
WBC: 7.7 10*3/uL (ref 4.0–10.5)
nRBC: 0 % (ref 0.0–0.2)

## 2022-01-19 LAB — PREGNANCY, URINE: Preg Test, Ur: NEGATIVE

## 2022-01-19 LAB — BASIC METABOLIC PANEL
Anion gap: 7 (ref 5–15)
BUN: 12 mg/dL (ref 6–20)
CO2: 24 mmol/L (ref 22–32)
Calcium: 9.2 mg/dL (ref 8.9–10.3)
Chloride: 107 mmol/L (ref 98–111)
Creatinine, Ser: 0.94 mg/dL (ref 0.44–1.00)
GFR, Estimated: >60 mL/min (ref >60)
Glucose, Bld: 114 mg/dL — ABNORMAL HIGH (ref 70–99)
Potassium: 3.5 mmol/L (ref 3.5–5.1)
Sodium: 138 mmol/L (ref 135–145)

## 2022-01-19 LAB — CBG MONITORING, ED: Glucose-Capillary: 112 mg/dL — ABNORMAL HIGH (ref 70–99)

## 2022-01-19 LAB — TROPONIN I (HIGH SENSITIVITY): Troponin I (High Sensitivity): <2 ng/L (ref <18)

## 2022-01-19 MED ORDER — INDOMETHACIN 50 MG PO CAPS: 50.0000 mg | ORAL_CAPSULE | Freq: Three times a day (TID) | ORAL | 0 refills | Status: DC

## 2022-01-19 MED ORDER — ALPRAZOLAM 0.5 MG PO TABS
0.5000 mg | ORAL_TABLET | Freq: Once | ORAL | Status: AC
Start: 2022-01-19 — End: 2022-01-19
  Administered 2022-01-19: 0.5 mg via ORAL
  Filled 2022-01-19: qty 1

## 2022-01-19 MED ORDER — KETOROLAC TROMETHAMINE 30 MG/ML IJ SOLN
15.0000 mg | Freq: Once | INTRAMUSCULAR | Status: AC
  Administered 2022-01-19: 15 mg via INTRAVENOUS
  Filled 2022-01-19: qty 1

## 2022-01-19 NOTE — ED Provider Notes (Signed)
[facility] EMERGENCY DEPARTMENT Provider Note   CSN: [id number] Arrival date & time: 01/19/22  0751     History  Chief Complaint  Patient presents with   Chest Pain    Sheri Johnson is a 36 y.o. female.  HPI     36 year old female comes in with chief complaint of chest pain.  Patient indicates that for the last 3 days she has been having epigastric abdominal pain and anterior chest pain.  The pain is described as squeezing pain, it is constant worse with her laying down or standing upright.  Better when she sits up.  Pain is worse with deep inspiration as well.  She denies any exertional component or postprandial component to this pain.  She denies history of similar quality of chest pain in the past.  Earlier today, she started noticing that the pain was radiating to her back into her shoulder area, prompting her to come to the ER.   Pt has no hx of PE, DVT and denies any exogenous hormone (testosterone / estrogen) use, long distance travels or surgery in the past 6 weeks, active cancer, recent immobilization.  Patient is supposed to get ablation done in the near future.  She denies any palpitations.  Patient denies any cough, URI-like symptoms.  She does feel bloated, despite taking Linzess.  She is still having bowel movement and passing flatus.  Patient also states that she did not take her anxiety medication this morning and feels a little anxious.   Home Medications Prior to Admission medications   Medication Sig Start Date End Date Taking? Authorizing Provider  acetaminophen (TYLENOL) 325 MG tablet You can take 2 tablets every 6 hours for pain.  Your prescribed pain medicine also has Tylenol(acetaminophen) in it.  Count each one of those as a Tylenol tablet.  Do not take more than 4000 mg of Tylenol(acetaminophen) per day. Patient taking differently: Take 650 mg by mouth every 6 (six) hours as needed for moderate pain. You can take 2 tablets every 6 hours for  pain.  Your prescribed pain medicine also has Tylenol(acetaminophen) in it.  Count each one of those as a Tylenol tablet.  Do not take more than 4000 mg of Tylenol(acetaminophen) per day. 12/15/16   Earnstine Regal, PA-C  Acetylcysteine (NAC) 600 MG CAPS Take 600 mg by mouth in the morning and at bedtime.    [provider]  ALPRAZolam Duanne Moron) 0.5 MG tablet Take 0.5 mg by mouth daily as needed for anxiety. 07/07/21   [provider]  amoxicillin-clavulanate (AUGMENTIN) 875-125 MG tablet Take 1 tablet by mouth every 12 (twelve) hours. Patient not taking: Reported on 01/12/2022 12/19/21   Varney Biles, MD  atenolol (TENORMIN) 25 MG tablet Take 50 mg by mouth at bedtime.    [provider]  ATROVENT HFA 17 MCG/ACT inhaler Inhale 1 puff into the lungs every 6 (six) hours as needed for wheezing. 08/23/21   [provider]  b complex vitamins capsule Take 1 capsule by mouth daily.    [provider]  cetirizine (ZYRTEC) 10 MG tablet Take 10 mg by mouth daily.    [provider]  cyclobenzaprine (FLEXERIL) 10 MG tablet Take 10 mg by mouth 3 (three) times daily as needed for muscle spasms. 07/07/21   [provider]  Digestive Enzymes CAPS Take 1 capsule by mouth 3 (three) times daily with meals.    [provider]  diltiazem (CARDIZEM) 30 MG tablet Take 1 tablet (30 mg  total) by mouth daily as needed (palpitations). 12/14/21   Tereso Newcomer T, PA-C  ferrous sulfate 325 (65 FE) MG tablet Take 325 mg by mouth 3 (three) times a week.    [provider]  fluticasone (FLONASE) 50 MCG/ACT nasal spray Place 1 spray into both nostrils at bedtime as needed for allergies or rhinitis.    [provider]  hydrOXYzine (ATARAX) 25 MG tablet Take 25 mg by mouth every 8 (eight) hours as needed for anxiety. 11/26/21   [provider]  ibuprofen (ADVIL,MOTRIN) 200 MG tablet You can take 2-3 tablets every 6 hours as needed for pain.  12/15/16   Sherrie George, PA-C  levothyroxine (SYNTHROID) 100 MCG tablet Take 100 mcg by mouth daily before breakfast. 12/14/21   [provider]  LINZESS 72 MCG capsule Take 72 mcg by mouth daily as needed (irritable bowel symptoms). 10/13/21   [provider]  Magnesium 250 MG TABS Take 250 mg by mouth daily.    [provider]  magnesium chloride (SLOW-MAG) 64 MG TBEC SR tablet Take 1 tablet (64 mg total) by mouth daily. Patient not taking: Reported on 01/12/2022 10/17/21   Dione Booze, MD  Misc Natural Products (BLOOD SUGAR BALANCE PO) Take 2 capsules by mouth daily.    [provider]  norethindrone (MICRONOR) 0.35 MG tablet Take 1 tablet by mouth daily.    [provider]  Omega-3 Fatty Acids (FISH OIL) 1000 MG CAPS Take 1,000 mg by mouth in the morning and at bedtime.    [provider]  omeprazole (PRILOSEC) 40 MG capsule Take 40 mg by mouth 2 (two) times daily. 01/10/22   [provider]  OVER THE COUNTER MEDICATION Take 2 tablets by mouth daily. Beet Root Supplement    [provider]  OVER THE COUNTER MEDICATION Take 1 Capful by mouth daily. Lung Health    [provider]  potassium chloride SA (KLOR-CON M) 20 MEQ tablet Take 1 tablet (20 mEq total) by mouth daily. 12/14/21   Tereso Newcomer T, PA-C  Probiotic Product (PROBIOTIC DAILY PO) Take 2 Capfuls by mouth daily.    [provider]  Zinc Oxide-Vitamin C (ZINC PLUS VITAMIN C PO) Take 1 tablet by mouth daily. With Vit D    [provider]      Allergies    Cipro [ciprofloxacin hcl] and Prednisone    Review of Systems   Review of Systems  All other systems reviewed and are negative.   Physical Exam Updated Vital Signs BP 128/61   Pulse 84   Temp 99.7 F (37.6 C) (Oral)   Resp 17   Ht 5\' 9"  (1.753 m)   Wt 108.9 kg   LMP 12/29/2021 (Approximate)   SpO2 99%   BMI 35.44 kg/m  Physical Exam Vitals and nursing note reviewed.   Constitutional:      Appearance: She is well-developed.  HENT:     Head: Atraumatic.  Cardiovascular:     Rate and Rhythm: Normal rate.     Heart sounds: Normal heart sounds. Heart sounds not distant.     No friction rub.  Pulmonary:     Effort: Pulmonary effort is normal.  Musculoskeletal:     Cervical back: Normal range of motion and neck supple.  Skin:    General: Skin is warm and dry.  Neurological:     Mental Status: She is alert and oriented to person, place, and time.     ED Results /  Procedures / Treatments   Labs (all labs ordered are listed, but only abnormal results are displayed) Labs Reviewed  BASIC METABOLIC PANEL - Abnormal; Notable for the following components:      Result Value   Glucose, Bld 114 (*)    All other components within normal limits  CBC - Abnormal; Notable for the following components:   RBC 5.24 (*)    All other components within normal limits  CBG MONITORING, ED - Abnormal; Notable for the following components:   Glucose-Capillary 112 (*)    All other components within normal limits  PREGNANCY, URINE  TROPONIN I (HIGH SENSITIVITY)  TROPONIN I (HIGH SENSITIVITY)    EKG None  ED ECG REPORT   Date: 01/19/2022  Rate: 114  Rhythm: sinus tachycardia  QRS Axis: normal  Intervals: QT prolonged  ST/T Wave abnormalities: nonspecific ST changes  Conduction Disutrbances:none  Narrative Interpretation:   Old EKG Reviewed: unchanged S1Q3T3 pattern noted I have personally reviewed the EKG tracing and agree with the computerized printout as noted.   Radiology DG Chest 2 View  Result Date: 01/19/2022 CLINICAL DATA:  chest pain/sob EXAM: CHEST - 2 VIEW COMPARISON:  Chest radiograph May 15, 23. FINDINGS: No consolidation. No visible pleural effusions or pneumothorax. Cardiomediastinal silhouette is within normal limits. No evidence of acute osseous abnormality. IMPRESSION: No evidence of acute cardiopulmonary disease. Electronically Signed    By: Margaretha Sheffield M.D.   On: 01/19/2022 08:23    Procedures Procedures    Medications Ordered in ED Medications  ALPRAZolam Duanne Moron) tablet 0.5 mg (0.5 mg Oral Given 01/19/22 0855)  ketorolac (TORADOL) 30 MG/ML injection 15 mg (15 mg Intravenous Given 01/19/22 0855)    ED Course/ Medical Decision Making/ A&P Clinical Course as of 01/19/22 1131  Thu Jan 19, 2022  1116 Troponin I (High Sensitivity) Initial high-sensitivity is less than 2.  Pain has been present now for several hours, I do not think a second troponin is needed.  Hear score is less than 3. [AN]    Clinical Course User Index [AN] Varney Biles, MD                           Medical Decision Making Amount and/or Complexity of Data Reviewed Labs: ordered. Decision-making details documented in ED Course. Radiology: ordered.  Risk Prescription drug management.   This patient presents to the ED with chief complaint(s) of chest pain for the last 3 days, that is now evolving to radiation to her back starting earlier this morning with pertinent past medical history of PSVT, IBS which further complicates the presenting complaint. The complaint involves an extensive differential diagnosis and also carries with it a high risk of complications and morbidity.    The differential diagnosis includes  Differential diagnosis includes: ACS syndrome Myocarditis Pericarditis Pericardial effusion / tamponade Pneumonia Pleural effusion / Pulmonary edema PE Pneumothorax Musculoskeletal pain PUD / Gastritis / Esophagitis Esophageal spasm  The initial plan is to order basic blood work right now including single high-sensitivity troponin in the setting of patient having chest pain that has been present now for more than 3 hours and hear score of less than 3 (heart score is 1).  Patient had a negative D-dimer earlier this year.  She had a negative CT PE earlier this year.  I do not think PE work-up is needed.  Clinically,  suspicion is for pericarditis.  However there is no pericardial friction rub or EKG showing diffuse  ST elevation.  Could be subclinical pericarditis versus PUD.  Additional history obtained: Records reviewed previous admission documents and previous CT scan  Independent labs interpretation:  The following labs were independently interpreted: Normal high-sensitivity troponin, normal CBC.  Independent visualization of imaging: - I independently visualized the following imaging with scope of interpretation limited to determining acute life threatening conditions related to emergency care: Chest x-ray, which revealed no evidence of pneumonia.   Final Clinical Impression(s) / ED Diagnoses Final diagnoses:  Precordial chest pain  Other acute back pain    Rx / DC Orders ED Discharge Orders     None         Derwood Kaplan, MD 01/19/22 1131

## 2022-01-19 NOTE — Discharge Instructions (Addendum)
We saw you in the ER for chest pain, back pain.  All the results in the ER are normal, labs and imaging. We are not sure what is causing your symptoms.  One of the possibilities includes pericarditis, we have provided instructions for it.  We have prescribed indomethacin -which would treat pericarditis or if there is inflammation of your chest wall.  The workup in the ER is not complete, and is limited to screening for life threatening and emergent conditions only, so please see a primary care doctor for further evaluation if the pain does not get better over the next 2 to 3 days or it is recurrent.

## 2022-01-19 NOTE — ED Notes (Signed)
Pt also asking for anxiety meds

## 2022-01-19 NOTE — ED Notes (Signed)
Patient given discharge instructions, all questions answered. Patient in possession of all belongings, directed to the discharge area  

## 2022-01-19 NOTE — ED Triage Notes (Signed)
C/o mid/left chest pain radiating to neck, arm & upper back. Hx SVT, was scheduled tomorrow for ablation but had to cancel d/t insurance. Also c/o dyspnea.

## 2022-01-20 ENCOUNTER — Ambulatory Visit (HOSPITAL_COMMUNITY): Admission: RE | Admit: 2022-01-20 | Payer: 59 | Source: Home / Self Care | Admitting: Internal Medicine

## 2022-01-20 ENCOUNTER — Encounter (HOSPITAL_COMMUNITY): Admission: RE | Payer: 59 | Source: Home / Self Care

## 2022-01-20 SURGERY — SVT ABLATION

## 2022-01-25 ENCOUNTER — Emergency Department (HOSPITAL_BASED_OUTPATIENT_CLINIC_OR_DEPARTMENT_OTHER): Payer: 59

## 2022-01-25 ENCOUNTER — Telehealth: Payer: Self-pay | Admitting: Internal Medicine

## 2022-01-25 ENCOUNTER — Emergency Department (HOSPITAL_BASED_OUTPATIENT_CLINIC_OR_DEPARTMENT_OTHER)
Admission: EM | Admit: 2022-01-25 | Discharge: 2022-01-25 | Disposition: A | Discharge status: Home / Self Care | Payer: 59 | Attending: Emergency Medicine | Admitting: Emergency Medicine

## 2022-01-25 ENCOUNTER — Other Ambulatory Visit: Payer: Self-pay

## 2022-01-25 ENCOUNTER — Encounter (HOSPITAL_BASED_OUTPATIENT_CLINIC_OR_DEPARTMENT_OTHER): Payer: Self-pay

## 2022-01-25 DIAGNOSIS — I10 Essential (primary) hypertension: Secondary | ICD-10-CM | POA: Diagnosis not present

## 2022-01-25 DIAGNOSIS — Z79899 Other long term (current) drug therapy: Secondary | ICD-10-CM | POA: Insufficient documentation

## 2022-01-25 DIAGNOSIS — R002 Palpitations: Secondary | ICD-10-CM

## 2022-01-25 DIAGNOSIS — R072 Precordial pain: Secondary | ICD-10-CM | POA: Diagnosis not present

## 2022-01-25 DIAGNOSIS — R5383 Other fatigue: Secondary | ICD-10-CM | POA: Diagnosis not present

## 2022-01-25 DIAGNOSIS — R55 Syncope and collapse: Secondary | ICD-10-CM

## 2022-01-25 DIAGNOSIS — R0602 Shortness of breath: Secondary | ICD-10-CM

## 2022-01-25 DIAGNOSIS — E039 Hypothyroidism, unspecified: Secondary | ICD-10-CM | POA: Insufficient documentation

## 2022-01-25 DIAGNOSIS — R61 Generalized hyperhidrosis: Secondary | ICD-10-CM | POA: Insufficient documentation

## 2022-01-25 DIAGNOSIS — R110 Nausea: Secondary | ICD-10-CM | POA: Insufficient documentation

## 2022-01-25 DIAGNOSIS — J45909 Unspecified asthma, uncomplicated: Secondary | ICD-10-CM | POA: Diagnosis not present

## 2022-01-25 DIAGNOSIS — R079 Chest pain, unspecified: Secondary | ICD-10-CM

## 2022-01-25 LAB — COMPREHENSIVE METABOLIC PANEL
ALT: 23 U/L (ref 0–44)
AST: 20 U/L (ref 15–41)
Albumin: 4.1 g/dL (ref 3.5–5.0)
Alkaline Phosphatase: 42 U/L (ref 38–126)
Anion gap: 7 (ref 5–15)
BUN: 14 mg/dL (ref 6–20)
CO2: 24 mmol/L (ref 22–32)
Calcium: 8.9 mg/dL (ref 8.9–10.3)
Chloride: 105 mmol/L (ref 98–111)
Creatinine, Ser: 0.97 mg/dL (ref 0.44–1.00)
GFR, Estimated: >60 mL/min (ref >60)
Glucose, Bld: 110 mg/dL — ABNORMAL HIGH (ref 70–99)
Potassium: 3.5 mmol/L (ref 3.5–5.1)
Sodium: 136 mmol/L (ref 135–145)
Total Bilirubin: 0.4 mg/dL (ref 0.3–1.2)
Total Protein: 7.1 g/dL (ref 6.5–8.1)

## 2022-01-25 LAB — TROPONIN I (HIGH SENSITIVITY): Troponin I (High Sensitivity): <2 ng/L (ref <18)

## 2022-01-25 LAB — URINALYSIS, ROUTINE W REFLEX MICROSCOPIC
Bilirubin Urine: NEGATIVE
Glucose, UA: NEGATIVE mg/dL
Hgb urine dipstick: NEGATIVE
Ketones, ur: NEGATIVE mg/dL
Leukocytes,Ua: NEGATIVE
Nitrite: NEGATIVE
Protein, ur: NEGATIVE mg/dL
Specific Gravity, Urine: 1.02 (ref 1.005–1.030)
pH: 6 (ref 5.0–8.0)

## 2022-01-25 LAB — CBC WITH DIFFERENTIAL/PLATELET
Abs Immature Granulocytes: 0.01 10*3/uL (ref 0.00–0.07)
Basophils Absolute: 0 10*3/uL (ref 0.0–0.1)
Basophils Relative: 0 %
Eosinophils Absolute: 0.4 10*3/uL (ref 0.0–0.5)
Eosinophils Relative: 5 %
HCT: 39.7 % (ref 36.0–46.0)
Hemoglobin: 13.8 g/dL (ref 12.0–15.0)
Immature Granulocytes: 0 %
Lymphocytes Relative: 27 %
Lymphs Abs: 2.3 10*3/uL (ref 0.7–4.0)
MCH: 28.3 pg (ref 26.0–34.0)
MCHC: 34.8 g/dL (ref 30.0–36.0)
MCV: 81.4 fL (ref 80.0–100.0)
Monocytes Absolute: 0.7 10*3/uL (ref 0.1–1.0)
Monocytes Relative: 8 %
Neutro Abs: 5.1 10*3/uL (ref 1.7–7.7)
Neutrophils Relative %: 60 %
Platelets: 273 10*3/uL (ref 150–400)
RBC: 4.88 MIL/uL (ref 3.87–5.11)
RDW: 13.4 % (ref 11.5–15.5)
WBC: 8.5 10*3/uL (ref 4.0–10.5)
nRBC: 0 % (ref 0.0–0.2)

## 2022-01-25 LAB — BRAIN NATRIURETIC PEPTIDE: B Natriuretic Peptide: 13.4 pg/mL (ref 0.0–100.0)

## 2022-01-25 LAB — PREGNANCY, URINE: Preg Test, Ur: NEGATIVE

## 2022-01-25 LAB — MAGNESIUM: Magnesium: 1.8 mg/dL (ref 1.7–2.4)

## 2022-01-25 NOTE — ED Provider Notes (Signed)
[facility] EMERGENCY DEPARTMENT Provider Note   CSN: [id number] Arrival date & time: 01/25/22  1839     History  Chief Complaint  Patient presents with   Chest Pain    Sheri Johnson is a 36 y.o. female.  The history is provided by the patient and medical records. No language interpreter was used.  Chest Pain Pain location:  Substernal area and L chest Pain quality: aching, crushing, pressure and radiating   Pain radiates to:  L arm Pain severity:  Severe Onset quality:  Sudden Timing:  Intermittent Progression:  Waxing and waning Chronicity:  Recurrent Relieved by:  Nothing Worsened by:  Exertion Ineffective treatments:  None tried Associated symptoms: diaphoresis, fatigue, nausea, near-syncope, palpitations and shortness of breath   Associated symptoms: no abdominal pain, no altered mental status, no back pain, no cough, no fever, no lower extremity edema, no numbness, no vomiting and no weakness        Home Medications Prior to Admission medications   Medication Sig Start Date End Date Taking? Authorizing Provider  acetaminophen (TYLENOL) 325 MG tablet You can take 2 tablets every 6 hours for pain.  Your prescribed pain medicine also has Tylenol(acetaminophen) in it.  Count each one of those as a Tylenol tablet.  Do not take more than 4000 mg of Tylenol(acetaminophen) per day. Patient taking differently: Take 650 mg by mouth every 6 (six) hours as needed for moderate pain. You can take 2 tablets every 6 hours for pain.  Your prescribed pain medicine also has Tylenol(acetaminophen) in it.  Count each one of those as a Tylenol tablet.  Do not take more than 4000 mg of Tylenol(acetaminophen) per day. 12/15/16   Earnstine Regal, PA-C  Acetylcysteine (NAC) 600 MG CAPS Take 600 mg by mouth in the morning and at bedtime.    [provider]  ALPRAZolam Duanne Moron) 0.5 MG tablet Take 0.5 mg by mouth daily as needed for anxiety. 07/07/21   [provider]   amoxicillin-clavulanate (AUGMENTIN) 875-125 MG tablet Take 1 tablet by mouth every 12 (twelve) hours. Patient not taking: Reported on 01/12/2022 12/19/21   Varney Biles, MD  atenolol (TENORMIN) 25 MG tablet Take 50 mg by mouth at bedtime.    [provider]  ATROVENT HFA 17 MCG/ACT inhaler Inhale 1 puff into the lungs every 6 (six) hours as needed for wheezing. 08/23/21   [provider]  b complex vitamins capsule Take 1 capsule by mouth daily.    [provider]  cetirizine (ZYRTEC) 10 MG tablet Take 10 mg by mouth daily.    [provider]  cyclobenzaprine (FLEXERIL) 10 MG tablet Take 10 mg by mouth 3 (three) times daily as needed for muscle spasms. 07/07/21   [provider]  Digestive Enzymes CAPS Take 1 capsule by mouth 3 (three) times daily with meals.    [provider]  diltiazem (CARDIZEM) 30 MG tablet Take 1 tablet (30 mg total) by mouth daily as needed (palpitations). 12/14/21   Richardson Dopp T, PA-C  ferrous sulfate 325 (65 FE) MG tablet Take 325 mg by mouth 3 (three) times a week.    [provider]  fluticasone (FLONASE) 50 MCG/ACT nasal spray Place 1 spray into both nostrils at bedtime as needed for allergies or rhinitis.    [provider]  hydrOXYzine (ATARAX) 25 MG tablet Take 25 mg by mouth every 8 (eight) hours as needed for anxiety. 11/26/21   [provider]  ibuprofen (ADVIL,MOTRIN) 200  MG tablet You can take 2-3 tablets every 6 hours as needed for pain. 12/15/16   Sherrie George, PA-C  indomethacin (INDOCIN) 50 MG capsule Take 1 capsule (50 mg total) by mouth 3 (three) times daily with meals. 01/19/22   Derwood Kaplan, MD  levothyroxine (SYNTHROID) 100 MCG tablet Take 100 mcg by mouth daily before breakfast. 12/14/21   [provider]  LINZESS 72 MCG capsule Take 72 mcg by mouth daily as needed (irritable bowel symptoms). 10/13/21   [provider]  Magnesium 250 MG TABS Take 250  mg by mouth daily.    [provider]  magnesium chloride (SLOW-MAG) 64 MG TBEC SR tablet Take 1 tablet (64 mg total) by mouth daily. Patient not taking: Reported on 01/12/2022 10/17/21   Dione Booze, MD  Misc Natural Products (BLOOD SUGAR BALANCE PO) Take 2 capsules by mouth daily.    [provider]  norethindrone (MICRONOR) 0.35 MG tablet Take 1 tablet by mouth daily.    [provider]  Omega-3 Fatty Acids (FISH OIL) 1000 MG CAPS Take 1,000 mg by mouth in the morning and at bedtime.    [provider]  omeprazole (PRILOSEC) 40 MG capsule Take 40 mg by mouth 2 (two) times daily. 01/10/22   [provider]  OVER THE COUNTER MEDICATION Take 2 tablets by mouth daily. Beet Root Supplement    [provider]  OVER THE COUNTER MEDICATION Take 1 Capful by mouth daily. Lung Health    [provider]  potassium chloride SA (KLOR-CON M) 20 MEQ tablet Take 1 tablet (20 mEq total) by mouth daily. 12/14/21   Tereso Newcomer T, PA-C  Probiotic Product (PROBIOTIC DAILY PO) Take 2 Capfuls by mouth daily.    [provider]  Zinc Oxide-Vitamin C (ZINC PLUS VITAMIN C PO) Take 1 tablet by mouth daily. With Vit D    [provider]      Allergies    Cipro [ciprofloxacin hcl] and Prednisone    Review of Systems   Review of Systems  Constitutional:  Positive for diaphoresis and fatigue. Negative for chills and fever.  HENT:  Negative for congestion.   Respiratory:  Positive for shortness of breath. Negative for cough, chest tightness and wheezing.   Cardiovascular:  Positive for chest pain, palpitations and near-syncope. Negative for leg swelling.  Gastrointestinal:  Positive for nausea. Negative for abdominal pain, constipation, diarrhea and vomiting.  Genitourinary:  Negative for dysuria and flank pain.  Musculoskeletal:  Negative for back pain and neck pain.  Skin:  Negative for rash and wound.  Neurological:  Positive for  light-headedness. Negative for syncope, weakness and numbness.  Psychiatric/Behavioral:  Negative for agitation.   All other systems reviewed and are negative.   Physical Exam Updated Vital Signs BP (!) 147/81 (BP Location: Right Arm)   Pulse (!) 118   Temp 99.3 F (37.4 C) (Oral)   Resp 18   Ht 5\' 9"  (1.753 m)   Wt 108.9 kg   LMP 12/29/2021 (Approximate)   SpO2 100%   BMI 35.44 kg/m  Physical Exam Vitals and nursing note reviewed.  Constitutional:      General: She is not in acute distress.    Appearance: She is well-developed. She is not ill-appearing, toxic-appearing or diaphoretic.  HENT:     Head: Normocephalic and atraumatic.  Eyes:     Extraocular Movements: Extraocular movements intact.     Conjunctiva/sclera: Conjunctivae normal.     Pupils: Pupils are equal,  round, and reactive to light.  Cardiovascular:     Rate and Rhythm: Regular rhythm. Tachycardia present.     Heart sounds: Normal heart sounds. No murmur heard. Pulmonary:     Effort: Pulmonary effort is normal. No tachypnea or respiratory distress.     Breath sounds: Normal breath sounds. No wheezing, rhonchi or rales.  Chest:     Chest wall: No tenderness.  Abdominal:     Palpations: Abdomen is soft.     Tenderness: There is no abdominal tenderness.  Musculoskeletal:        General: No swelling.     Cervical back: Neck supple.     Right lower leg: No tenderness. No edema.     Left lower leg: No tenderness. No edema.  Skin:    General: Skin is warm and dry.     Capillary Refill: Capillary refill takes less than 2 seconds.  Neurological:     General: No focal deficit present.     Mental Status: She is alert.  Psychiatric:        Mood and Affect: Mood is anxious.     ED Results / Procedures / Treatments   Labs (all labs ordered are listed, but only abnormal results are displayed) Labs Reviewed  COMPREHENSIVE METABOLIC PANEL - Abnormal; Notable for the following components:      Result Value    Glucose, Bld 110 (*)    All other components within normal limits  URINALYSIS, ROUTINE W REFLEX MICROSCOPIC  PREGNANCY, URINE  CBC WITH DIFFERENTIAL/PLATELET  BRAIN NATRIURETIC PEPTIDE  MAGNESIUM  TSH  TROPONIN I (HIGH SENSITIVITY)  TROPONIN I (HIGH SENSITIVITY)    EKG EKG Interpretation  Date/Time:  Wednesday January 25 2022 18:49:18 EDT Ventricular Rate:  118 PR Interval:  148 QRS Duration: 84 QT Interval:  328 QTC Calculation: 459 R Axis:   91 Text Interpretation: Sinus tachycardia Rightward axis Cannot rule out Anterior infarct , age undetermined Abnormal ECG When compared with ECG of 19-Jan-2022 07:59, PREVIOUS ECG IS PRESENT when compared to prior, faster rate than prior. No STEMI Confirmed by Theda Belfast ([id number]) on 01/25/2022 7:21:13 PM  Radiology DG Chest Portable 1 View  Result Date: 01/25/2022 CLINICAL DATA:  Chest pain and shortness of breath. EXAM: PORTABLE CHEST 1 VIEW COMPARISON:  Chest x-ray dated January 19, 2022. FINDINGS: The heart size and mediastinal contours are within normal limits. Both lungs are clear. The visualized skeletal structures are unremarkable. IMPRESSION: No active disease. Electronically Signed   By: Obie Dredge M.D.   On: 01/25/2022 19:25    Procedures Procedures    Medications Ordered in ED Medications - No data to display  ED Course/ Medical Decision Making/ A&P                           Medical Decision Making Amount and/or Complexity of Data Reviewed Labs: ordered. Radiology: ordered.    Reeda Soohoo is a 36 y.o. female with a past medical history significant for previous appendicitis, hypothyroidism, hypertension, asthma, anxiety, and recurrent SVT who was previously scheduled to get an ablation done this past week but had to be canceled who presents with recurrent episodes of chest pain, shortness of breath, lightheadedness/near syncope, and SVT.  According to patient, almost every time she gets up, stands up, and walks  around she has onset of exertional chest pain and shortness of breath and palpitations.  She reports her heart rate is jumping at least over 100  but nearly every day she has been having episodes of SVT with heart rate going into the 190s and even 200s.  She reports blood pressure has been very labile as well.  Today, she had an episode that had severe crushing chest pain with exertion and it was going into her arm with nausea, lightheadedness, shortness of breath.  Patient reports she nearly had a syncopal episode.  She reports the pain is moderate to severe at times.  She says that she has felt fatigued and lightheaded despite drinking lots of fluids.  She denies any new leg pain or leg swelling at this time.  Denies any new abdominal pain.  Denies any constipation or diarrhea that is new but has seen a GI doctor for previous GI troubles.  EKG on arrival does not show SVT.  No STEMI.  On exam, lungs clear and chest nontender.  Abdomen nontender.  No murmur appreciated.  Good pulses in extremities.  Legs nontender and nonedematous.  Mucous membranes are not dry.  Work-up shows that patient has had previous PE study that did not showed some pulmonary embolism.  Given her report that she was supposed to have an SVT ablation this past week and is still having episodes I anticipate will need this week with cardiology.  She sounds like she is having episodes of symptomatic SVT and exertional pain that could be cardiac in nature.  We will get labs, troponins, keep on the monitor, and get chest x-ray and electrolytes.  Given her negative work-up for PE in the past and lack of new leg pain or leg swelling or other pleuritic symptoms, patient and I agree to hold on PE work-up at this time.  Anticipate discussion with cardiology after work-up is completed  Spoke to cardiology and they recommended giving the patient fluids, home medicines, UDS, and ambulating.  If she had recurrent symptoms, they requested to be  called again for likely admission however she was able to ambulate without difficulty, she would be safe to go to her cardiology appointment tomorrow.  I spoke to her and offered the fluids and meds but she is feeling better and would like to try ambulating now before other intervention.  Patient was able to ambulate to the bathroom around without difficulty and wants to go home.  She feels that she can make it to her cardiology room tomorrow and I agree.  She will take her home medicines at home and rest and stay hydrated and get some sleep.  She understands return precautions for any development of new symptoms or any worsened changes.  She no other questions or concerns and was discharged in stable condition.        Final Clinical Impression(s) / ED Diagnoses Final diagnoses:  Chest pain, unspecified type  Near syncope  Shortness of breath  Palpitations    Rx / DC Orders ED Discharge Orders     None      Clinical Impression: 1. Chest pain, unspecified type   2. Near syncope   3. Shortness of breath   4. Palpitations     Disposition: Discharge  Condition: Stable  I have discussed the results, Dx and Tx plan with the pt(& family if present). He/she/they expressed understanding and agree(s) with the plan. Discharge instructions discussed at great length. Strict return precautions discussed and pt &/or family have verbalized understanding of the instructions. No further questions at time of discharge.    New Prescriptions   No medications on file  Follow Up: your cardiologist tomorrow        Abree Romick, Gwenyth Allegra, MD 01/25/22 2150

## 2022-01-25 NOTE — ED Triage Notes (Signed)
C/o chest pain x 15 mins, throbbing in left arm, sob, dizziness, nausea. Hx SVT

## 2022-01-25 NOTE — Plan of Care (Signed)
36 year old female presents to the hospital with episode of chest pain associated with well-known Svt.  Patient reports symptoms of chest tightness, chest pressure with minimal activity despite her current complex medical regimen. While in emergency room patient's normal sinus rhythm.  Asymptomatic.  CBC and BMP unremarkable.  Troponin and BNP normal limits.  Patient has appointment see cardiologist tomorrow. Plan. Follow-up UDS. Please administer 1 L bolus LR. Please administer home dose of atenolol and diltiazem. Subsequent trial, to reassess patient disposition

## 2022-01-25 NOTE — Telephone Encounter (Signed)
Pt c/o of Chest Pain: STAT if CP now or developed within 24 hours  1. Are you having CP right now?  No, patient is sitting right now  2. Are you experiencing any other symptoms (ex. SOB, nausea, vomiting, sweating)?  Chest tightness  3. How long have you been experiencing CP?  3-4 months   4. Is your CP continuous or coming and going?  Coming and going, mainly with minimal exertion  5. Have you taken Nitroglycerin?   No, patient states she doesn't have any

## 2022-01-25 NOTE — Telephone Encounter (Signed)
Spoke with pt, she reports continued chest pain for some time. She reports with any exertion her heart rate will elevate and she will get tightness across her chest, dizziness and nausea, esp. When walking on the treadmill. She was recently seen in the ER and was given anti inflammatories as she reports they thought it sounded like pericarditis. She was scheduled to be seen tomorrow afternoon.

## 2022-01-25 NOTE — Telephone Encounter (Signed)
Called patient, LVM to call back to discuss.  Left call back number.   

## 2022-01-25 NOTE — ED Notes (Signed)
Pt ambulated per MD request, pt reports that her symptoms have improved since PTA. Pt feels comfortable being discharged and following up with cardiology tomorrow.

## 2022-01-25 NOTE — ED Notes (Signed)
Written and verbal inst to pt  Verbalized an understanding  To home with family  

## 2022-01-25 NOTE — Telephone Encounter (Signed)
Patient is returning call.  °

## 2022-01-25 NOTE — Discharge Instructions (Signed)
Your history, exam, work-up today are consistent with recurrent palpitations and intermittent SVT episodes at home.  Your work-up here was reassuring.  We spoke with cardiology who felt that if you were able to ambulate without difficulty and were feeling better you would be appropriate to follow-up with cardiology tomorrow.  As you able to ambulate without feeling lightheaded or having more chest pain or shortness of breath, we feel you are safe for discharge home.  Please rest and stay hydrated and take your home medicines.  If any symptoms change or worsen acutely overnight, please return but otherwise go to your appointment tomorrow.

## 2022-01-26 ENCOUNTER — Encounter: Payer: Self-pay | Admitting: Nurse Practitioner

## 2022-01-26 ENCOUNTER — Ambulatory Visit (INDEPENDENT_AMBULATORY_CARE_PROVIDER_SITE_OTHER): Payer: 59 | Admitting: Nurse Practitioner

## 2022-01-26 VITALS — BP 118/80 | HR 88 | Ht 69.0 in | Wt 241.8 lb

## 2022-01-26 DIAGNOSIS — E039 Hypothyroidism, unspecified: Secondary | ICD-10-CM

## 2022-01-26 DIAGNOSIS — R000 Tachycardia, unspecified: Secondary | ICD-10-CM

## 2022-01-26 DIAGNOSIS — R072 Precordial pain: Secondary | ICD-10-CM

## 2022-01-26 DIAGNOSIS — R0602 Shortness of breath: Secondary | ICD-10-CM | POA: Diagnosis not present

## 2022-01-26 DIAGNOSIS — R002 Palpitations: Secondary | ICD-10-CM

## 2022-01-26 DIAGNOSIS — I471 Supraventricular tachycardia: Secondary | ICD-10-CM

## 2022-01-26 DIAGNOSIS — Z79899 Other long term (current) drug therapy: Secondary | ICD-10-CM

## 2022-01-26 DIAGNOSIS — I10 Essential (primary) hypertension: Secondary | ICD-10-CM

## 2022-01-26 LAB — TSH: TSH: 10.755 u[IU]/mL — ABNORMAL HIGH (ref 0.350–4.500)

## 2022-01-26 MED ORDER — IVABRADINE HCL 5 MG PO TABS: ORAL_TABLET | ORAL | 0 refills | Status: DC

## 2022-01-26 MED ORDER — METOPROLOL TARTRATE 100 MG PO TABS: ORAL_TABLET | ORAL | 3 refills | Status: DC

## 2022-01-26 NOTE — Patient Instructions (Addendum)
Medication Instructions:  Take Ivabradine 10 mg one tablet 2 hours before procedure.   *If you need a refill on your cardiac medications before your next appointment, please call your pharmacy*   Lab Work: Your physician recommends that you return for lab work 1 week before scan BMET  If you have labs (blood work) drawn today and your tests are completely normal, you will receive your results only by: MyChart Message (if you have MyChart) OR A paper copy in the mail If you have any lab test that is abnormal or we need to change your treatment, we will call you to review the results.   Testing/Procedures: Your physician has requested that you have an echocardiogram. Echocardiography is a painless test that uses sound waves to create images of your heart. It provides your doctor with information about the size and shape of your heart and how well your heart's chambers and valves are working. This procedure takes approximately one hour. There are no restrictions for this procedure.     Your cardiac CT will be scheduled at one of the below locations:   [facility] [address] [place], [place] [address] [phone]  OR  [facility] [address] [place], [place] [address] [phone]  If scheduled at [facility], please arrive at the [facility] En[facility] ([facility] C2[facility] 30 minutes prior to test start time. You can use the FREE valet parking offered at entrance C (encouraged to control the heart rate for the test)  Proceed to the [facility] Radiology Department (first floor) to check-in and test prep.  All radiology patients and guests should use entrance C2 at [facility], accessed from [facility], even though the hospital's physical address listed is [address]    If scheduled at [facility], please arrive 15 mins  early for check-in and test prep.  Please follow these instructions carefully (unless otherwise directed):  Hold all erectile dysfunction medications at least 3 days (72 hrs) prior to test.  On the Night Before the Test: Be sure to Drink plenty of water. Do not consume any caffeinated/decaffeinated beverages or chocolate 12 hours prior to your test. Do not take any antihistamines 12 hours prior to your test. If the patient has contrast allergy: Patient will need a prescription for Prednisone and very clear instructions (as follows): Prednisone 50 mg - take 13 hours prior to test Take another Prednisone 50 mg 7 hours prior to test Take another Prednisone 50 mg 1 hour prior to test Take Benadryl 50 mg 1 hour prior to test Patient must complete all four doses of above prophylactic medications. Patient will need a ride after test due to Benadryl.  On the Day of the Test: Drink plenty of water until 1 hour prior to the test. Do not eat any food 4 hours prior to the test. You may take your regular medications prior to the test.  Take metoprolol (Lopressor) two hours prior to test. HOLD Furosemide/Hydrochlorothiazide morning of the test. FEMALES- please wear underwire-free bra if available, avoid dresses & tight clothing   *For Clinical Staff only. Please instruct patient the following:* Heart Rate Medication Recommendations for Cardiac CT  Resting HR < 50 bpm  No medication  Resting HR 50-60 bpm and BP >110/50 mmHG   Consider Metoprolol tartrate 25 mg PO 90-120 min prior to scan  Resting HR 60-65 bpm and BP >110/50  mmHG  Metoprolol tartrate 50 mg PO 90-120 minutes prior to scan   Resting HR > 65 bpm and BP >110/50 mmHG  Metoprolol tartrate 100 mg PO 90-120 minutes prior to scan  Consider Ivabradine 10-15 mg PO or a calcium channel blocker for resting HR >60 bpm and contraindication to metoprolol tartrate  Consider Ivabradine 10-15 mg PO in combination with metoprolol tartrate for HR  >80 bpm         After the Test: Drink plenty of water. After receiving IV contrast, you may experience a mild flushed feeling. This is normal. On occasion, you may experience a mild rash up to 24 hours after the test. This is not dangerous. If this occurs, you can take Benadryl 25 mg and increase your fluid intake. If you experience trouble breathing, this can be serious. If it is severe call 911 IMMEDIATELY. If it is mild, please call our office. If you take any of these medications: Glipizide/Metformin, Avandament, Glucavance, please do not take 48 hours after completing test unless otherwise instructed.  We will call to schedule your test 2-4 weeks out understanding that some insurance companies will need an authorization prior to the service being performed.   For non-scheduling related questions, please contact the cardiac imaging nurse navigator should you have any questions/concerns: Rockwell Alexandria, Cardiac Imaging Nurse Navigator Larey Brick, Cardiac Imaging Nurse Navigator [facility] Direct Office Dial: [phone]   For scheduling needs, including cancellations and rescheduling, please call [place], [phone].  Follow-Up: At [facility], you and your health needs are our priority.  As part of our continuing mission to provide you with exceptional heart care, we have created designated Provider Care Teams.  These Care Teams include your primary Cardiologist (physician) and Advanced Practice Providers (APPs -  Physician Assistants and Nurse Practitioners) who all work together to provide you with the care you need, when you need it.  We recommend signing up for the patient portal called "MyChart".  Sign up information is provided on this After Visit Summary.  MyChart is used to connect with patients for Virtual Visits (Telemedicine).  Patients are able to view lab/test results, encounter notes, upcoming appointments, etc.  Non-urgent messages can be  sent to your provider as well.   To learn more about what you can do with MyChart, go to [email or web address].    Your next appointment:    Pt will call back to schedule follow up needed 2-3 months  The format for your next appointment:   In Person  Provider:   Olga Millers, MD  or Bernadene Person, NP        Other Instructions   Important Information About Sugar

## 2022-01-26 NOTE — Progress Notes (Signed)
Office Visit    Patient Name: Sheri Johnson Date of Encounter: 01/26/2022  Primary Care Provider:  [facility] Primary Cardiologist:  Kirk Ruths, MD  Chief Complaint    36 year old female with a history of PSVT, inappropriate sinus tachycardia, atypical chest pain, hypertension, hypothyroidism, and asthma who presents for follow-up related to chest pain and palpitations.  Past Medical History    Past Medical History:  Diagnosis Date   Abortion 11/14/2021   Anxiety    Asthma    Hypertension    Hypothyroid    SVT (supraventricular tachycardia) ([place])    Past Surgical History:  Procedure Laterality Date   CESAREAN SECTION     LAPAROSCOPIC APPENDECTOMY N/A 12/14/2016   Procedure: APPENDECTOMY LAPAROSCOPIC;  Surgeon: Jovita Kussmaul, MD;  Location: [facility] OR;  Service: General;  Laterality: N/A;    Allergies  Allergies  Allergen Reactions   Cipro [Ciprofloxacin Hcl] Palpitations   Prednisone Palpitations    History of Present Illness    36 year old female with the above past medical history including PSVT, inappropriate sinus tachycardia, atypical chest pain, hypertension, hypothyroidism, and asthma.  She was initially evaluated by Dr. Stanford Breed in June 2022 in the setting of increased palpitations and COVID-19 infection earlier that year. She was noted to be in SVT (ECG with SVT 231 with diffuse ST depression); EMS terminated arrhythmia with adenosine. She noted a prior history of inappropriate sinus tachycardia, managed with beta blocker. Echocardiogram in July 2022 showed normal LV function, mild left atrial enlargement. Cardiac monitor in April 2023 showed sinus rhythm with rare PVCs. She was evaluated in the ED in April 2023 with complaints of palpitations, and chest pain. Troponins were negative.  She was evaluated in the office in early May 2023.  Her metoprolol was switched to atenolol per patient request. She was last seen in the  office on 12/26/2021 by Dr. Lovena Le. EP study and ablation were discussed for recurrent SVT.  She was evaluated in the ED on 01/19/2022 with similar symptoms.  Troponin was negative. EKG and chest x-ray were unremarkable.  She was treated with anti-inflammatories for suspected pericarditis and was discharged home and advised to follow-up with cardiology.   She contacted our office on 01/25/2022 with complaints of a 3 to 86-monthhistory of intermittent chest tightness with minimal exertion with associated dizziness and nausea. She returned to the ED on 01/25/2022 with complaints of intermittent substernal left chest pain which she describes an aching, crushing, pressure, radiating to her left arm. Associated diaphoresis, fatigue, nausea, lightheadedness, near syncope, shortness of breath, and palpitations.  Ported home BP in the 200s/100s, HR in the 200s.  EKG was unremarkable, CBC, BMP, Mg, troponin, and BNP were unremarkable. TSH was elevated at 10.755. Symptoms resolved, she was discharged home and advised to follow-up with cardiology as scheduled on 01/26/2022.  She presents today for follow-up with a myriad of concerns. Since her last visit and since her recent ED visit continues to report episodes of chest pain, shortness of breath, palpitations, lightheadedness, near syncope, and fatigue.  These episodes occur 1-2 times a day and last for approximately 15 minutes to an hour at a time. They have been worsening over the past 4 months.  She feels like she has a bandlike pain that starts in her mid back and wraps around to her chest.  She reports associated lightheadedness, nausea, shortness of breath.  Additionally, she states she feels what she describes as "a burst of histamine."  She notes associated hives, skin rash with her symptoms.  She wonders if she may be having an allergic reaction to something.  Additionally, she does note that her and symptoms improve from lying to sitting.  She states that the  anti-inflammatories was prescribed in the ED did not help.  She does have follow-up scheduled with her primary care doctor to discuss her thyroid function as well as her ongoing symptoms. She is concerned and is frustrated that she continues to feel so poorly.  Home Medications    Current Outpatient Medications  Medication Sig Dispense Refill   acetaminophen (TYLENOL) 325 MG tablet You can take 2 tablets every 6 hours for pain.  Your prescribed pain medicine also has Tylenol(acetaminophen) in it.  Count each one of those as a Tylenol tablet.  Do not take more than 4000 mg of Tylenol(acetaminophen) per day. (Patient taking differently: Take 650 mg by mouth every 6 (six) hours as needed for moderate pain. You can take 2 tablets every 6 hours for pain.  Your prescribed pain medicine also has Tylenol(acetaminophen) in it.  Count each one of those as a Tylenol tablet.  Do not take more than 4000 mg of Tylenol(acetaminophen) per day.) 100 tablet 2   Acetylcysteine (NAC) 600 MG CAPS Take 600 mg by mouth in the morning and at bedtime.     ALPRAZolam (XANAX) 0.5 MG tablet Take 0.5 mg by mouth daily as needed for anxiety.     amoxicillin-clavulanate (AUGMENTIN) 875-125 MG tablet Take 1 tablet by mouth every 12 (twelve) hours. 14 tablet 0   atenolol (TENORMIN) 25 MG tablet Take 50 mg by mouth at bedtime.     ATROVENT HFA 17 MCG/ACT inhaler Inhale 1 puff into the lungs every 6 (six) hours as needed for wheezing.     b complex vitamins capsule Take 1 capsule by mouth daily.     cetirizine (ZYRTEC) 10 MG tablet Take 10 mg by mouth daily.     cyclobenzaprine (FLEXERIL) 10 MG tablet Take 10 mg by mouth 3 (three) times daily as needed for muscle spasms.     Digestive Enzymes CAPS Take 1 capsule by mouth 3 (three) times daily with meals.     diltiazem (CARDIZEM) 30 MG tablet Take 1 tablet (30 mg total) by mouth daily as needed (palpitations). 30 tablet 1   ferrous sulfate 325 (65 FE) MG tablet Take 325 mg by mouth 3  (three) times a week.     fluticasone (FLONASE) 50 MCG/ACT nasal spray Place 1 spray into both nostrils at bedtime as needed for allergies or rhinitis.     hydrOXYzine (ATARAX) 25 MG tablet Take 25 mg by mouth every 8 (eight) hours as needed for anxiety.     ibuprofen (ADVIL,MOTRIN) 200 MG tablet You can take 2-3 tablets every 6 hours as needed for pain.     indomethacin (INDOCIN) 50 MG capsule Take 1 capsule (50 mg total) by mouth 3 (three) times daily with meals. 21 capsule 0   ivabradine (CORLANOR) 5 MG TABS tablet Take 10 mg 2 hours prior to procedure. 2 tablet 0   levothyroxine (SYNTHROID) 100 MCG tablet Take 100 mcg by mouth daily before breakfast.     LINZESS 72 MCG capsule Take 72 mcg by mouth daily as needed (irritable bowel symptoms).     Magnesium 250 MG TABS Take 250 mg by mouth daily.     magnesium chloride (SLOW-MAG) 64 MG TBEC SR tablet Take 1 tablet (64 mg total) by mouth  daily. 30 tablet 0   Misc Natural Products (BLOOD SUGAR BALANCE PO) Take 2 capsules by mouth daily.     norethindrone (MICRONOR) 0.35 MG tablet Take 1 tablet by mouth daily.     Omega-3 Fatty Acids (FISH OIL) 1000 MG CAPS Take 1,000 mg by mouth in the morning and at bedtime.     omeprazole (PRILOSEC) 40 MG capsule Take 40 mg by mouth 2 (two) times daily.     OVER THE COUNTER MEDICATION Take 2 tablets by mouth daily. Beet Root Supplement     OVER THE COUNTER MEDICATION Take 1 Capful by mouth daily. Lung Health     potassium chloride SA (KLOR-CON M) 20 MEQ tablet Take 1 tablet (20 mEq total) by mouth daily. 30 tablet 6   Probiotic Product (PROBIOTIC DAILY PO) Take 2 Capfuls by mouth daily.     Zinc Oxide-Vitamin C (ZINC PLUS VITAMIN C PO) Take 1 tablet by mouth daily. With Vit D     No current facility-administered medications for this visit.     Review of Systems    She denies, pnd, orthopnea, n, v, syncope, edema, weight gain, or early satiety. All other systems reviewed and are otherwise negative except as  noted above.   Physical Exam    VS:  BP 118/80   Pulse 88   Ht _0  (1.753 m)   Wt 241 lb 12.8 oz (109.7 kg)   LMP 12/29/2021 (Approximate)   SpO2 98%   BMI 35.71 kg/m     GEN: Well nourished, well developed, in no acute distress. HEENT: normal. Neck: Supple, no JVD, carotid bruits, or masses. Cardiac: RRR, no murmurs, rubs, or gallops. No clubbing, cyanosis, edema.  Radials/DP/PT 2+ and equal bilaterally.  Respiratory:  Respirations regular and unlabored, clear to auscultation bilaterally. GI: Soft, nontender, nondistended, BS + x 4. MS: no deformity or atrophy. Skin: warm and dry, no rash. Neuro:  Strength and sensation are intact. Psych: Normal affect.  Accessory Clinical Findings    ECG personally reviewed by me today -NSR, 80 bpm- no acute changes.  Lab Results  Component Value Date   WBC 8.5 01/25/2022   HGB 13.8 01/25/2022   HCT 39.7 01/25/2022   MCV 81.4 01/25/2022   PLT 273 01/25/2022   Lab Results  Component Value Date   CREATININE 0.97 01/25/2022   BUN 14 01/25/2022   NA 136 01/25/2022   K 3.5 01/25/2022   CL 105 01/25/2022   CO2 24 01/25/2022   Lab Results  Component Value Date   ALT 23 01/25/2022   AST 20 01/25/2022   ALKPHOS 42 01/25/2022   BILITOT 0.4 01/25/2022   No results found for: "CHOL", "HDL", "LDLCALC", "LDLDIRECT", "TRIG", "CHOLHDL"  No results found for: "HGBA1C"  Assessment & Plan    1. Precordial chest pain/shortness of breath: Over the past 4 months she has noted increasing episodes (1-2 times a day, lasting 15 minutes to an hour at a time) of chest and back pain that radiates to her left arm, shortness of breath, dizziness, presyncope, with associated nausea. Additionally, she states she feels what she describes as "a burst of histamine."  She notes associated hives, skin rash with her symptoms. Symptoms are overall atypical for angina and more suspicious for SVT, possible allergic reaction. However, will obtain coronary CTA to  rule out ischemia.  She declines premedication with metoprolol prior to coronary CTA therefore, will give ivabradine. BMET 1 week prior to CT. Will also check limited echo  to screen for pericarditis given that he states her symptoms improve from lying to sitting, though I have low suspicion for this.   2. PSVT/inappropriate sinus tachycardia/palpitations: She is pending possible SVT ablation.  Her recent symptoms as above are suspicious for possible bursts of SVT.  She states she has not taken her prn diltiazem as she was "afraid it would interact with her atenolol." I advised her to take diltiazem to see if her symptoms improve.  Discussed possible repeat cardiac monitor, however, she declines as she states she was previously allergic to the adhesive.  Recommend follow-up with EP.  Continue atenolol,and prn diltiazem.  3. Hypertension: BP well controlled. Continue current antihypertensive regimen.   4. Hypothyroidism: TSH 10.755 on 01/25/2022. Follow up with PCP.   5. Disposition: Follow-up in 2-3 months. Patient states she will call to schedule an appointment.   Lenna Sciara, NP 01/26/2022, 5:04 PM

## 2022-01-30 ENCOUNTER — Other Ambulatory Visit: Payer: Self-pay

## 2022-01-30 ENCOUNTER — Encounter (HOSPITAL_COMMUNITY): Payer: Self-pay

## 2022-01-30 ENCOUNTER — Emergency Department (HOSPITAL_COMMUNITY): Payer: 59

## 2022-01-30 ENCOUNTER — Observation Stay (HOSPITAL_COMMUNITY)
Admission: EM | Admit: 2022-01-30 | Discharge: 2022-01-31 | Disposition: A | Discharge status: Home / Self Care | Payer: 59 | Attending: Internal Medicine | Admitting: Internal Medicine

## 2022-01-30 DIAGNOSIS — I471 Supraventricular tachycardia, unspecified: Secondary | ICD-10-CM

## 2022-01-30 DIAGNOSIS — Z79899 Other long term (current) drug therapy: Secondary | ICD-10-CM | POA: Diagnosis not present

## 2022-01-30 DIAGNOSIS — R0602 Shortness of breath: Secondary | ICD-10-CM | POA: Diagnosis not present

## 2022-01-30 DIAGNOSIS — J45909 Unspecified asthma, uncomplicated: Secondary | ICD-10-CM | POA: Diagnosis not present

## 2022-01-30 DIAGNOSIS — E039 Hypothyroidism, unspecified: Secondary | ICD-10-CM | POA: Insufficient documentation

## 2022-01-30 DIAGNOSIS — R0789 Other chest pain: Secondary | ICD-10-CM | POA: Diagnosis not present

## 2022-01-30 DIAGNOSIS — I10 Essential (primary) hypertension: Secondary | ICD-10-CM | POA: Diagnosis not present

## 2022-01-30 DIAGNOSIS — J320 Chronic maxillary sinusitis: Secondary | ICD-10-CM | POA: Insufficient documentation

## 2022-01-30 DIAGNOSIS — J0100 Acute maxillary sinusitis, unspecified: Secondary | ICD-10-CM

## 2022-01-30 DIAGNOSIS — R079 Chest pain, unspecified: Secondary | ICD-10-CM

## 2022-01-30 DIAGNOSIS — R29818 Other symptoms and signs involving the nervous system: Secondary | ICD-10-CM | POA: Diagnosis not present

## 2022-01-30 LAB — COMPREHENSIVE METABOLIC PANEL
ALT: 24 U/L (ref 0–44)
AST: 22 U/L (ref 15–41)
Albumin: 4.5 g/dL (ref 3.5–5.0)
Alkaline Phosphatase: 42 U/L (ref 38–126)
Anion gap: 16 — ABNORMAL HIGH (ref 5–15)
BUN: 11 mg/dL (ref 6–20)
CO2: 20 mmol/L — ABNORMAL LOW (ref 22–32)
Calcium: 9.9 mg/dL (ref 8.9–10.3)
Chloride: 105 mmol/L (ref 98–111)
Creatinine, Ser: 1.19 mg/dL — ABNORMAL HIGH (ref 0.44–1.00)
GFR, Estimated: >60 mL/min (ref >60)
Glucose, Bld: 125 mg/dL — ABNORMAL HIGH (ref 70–99)
Potassium: 3.1 mmol/L — ABNORMAL LOW (ref 3.5–5.1)
Sodium: 141 mmol/L (ref 135–145)
Total Bilirubin: 0.5 mg/dL (ref 0.3–1.2)
Total Protein: 7.2 g/dL (ref 6.5–8.1)

## 2022-01-30 LAB — CBC WITH DIFFERENTIAL/PLATELET
Abs Immature Granulocytes: 0.03 10*3/uL (ref 0.00–0.07)
Basophils Absolute: 0.1 10*3/uL (ref 0.0–0.1)
Basophils Relative: 0 %
Eosinophils Absolute: 0.5 10*3/uL (ref 0.0–0.5)
Eosinophils Relative: 4 %
HCT: 43.7 % (ref 36.0–46.0)
Hemoglobin: 15.2 g/dL — ABNORMAL HIGH (ref 12.0–15.0)
Immature Granulocytes: 0 %
Lymphocytes Relative: 32 %
Lymphs Abs: 4.1 10*3/uL — ABNORMAL HIGH (ref 0.7–4.0)
MCH: 28.6 pg (ref 26.0–34.0)
MCHC: 34.8 g/dL (ref 30.0–36.0)
MCV: 82.3 fL (ref 80.0–100.0)
Monocytes Absolute: 1 10*3/uL (ref 0.1–1.0)
Monocytes Relative: 8 %
Neutro Abs: 7.3 10*3/uL (ref 1.7–7.7)
Neutrophils Relative %: 56 %
Platelets: 336 10*3/uL (ref 150–400)
RBC: 5.31 MIL/uL — ABNORMAL HIGH (ref 3.87–5.11)
RDW: 13.2 % (ref 11.5–15.5)
WBC: 12.9 10*3/uL — ABNORMAL HIGH (ref 4.0–10.5)
nRBC: 0 % (ref 0.0–0.2)

## 2022-01-30 LAB — D-DIMER, QUANTITATIVE: D-Dimer, Quant: 0.48 ug/mL-FEU (ref 0.00–0.50)

## 2022-01-30 LAB — I-STAT BETA HCG BLOOD, ED (MC, WL, AP ONLY): I-stat hCG, quantitative: <5 m[IU]/mL (ref <5)

## 2022-01-30 LAB — LACTIC ACID, PLASMA: Lactic Acid, Venous: 1.4 mmol/L (ref 0.5–1.9)

## 2022-01-30 LAB — T4, FREE: Free T4: 0.68 ng/dL (ref 0.61–1.12)

## 2022-01-30 LAB — BRAIN NATRIURETIC PEPTIDE: B Natriuretic Peptide: 24.5 pg/mL (ref 0.0–100.0)

## 2022-01-30 LAB — TSH: TSH: 10.935 u[IU]/mL — ABNORMAL HIGH (ref 0.350–4.500)

## 2022-01-30 LAB — MAGNESIUM: Magnesium: 2 mg/dL (ref 1.7–2.4)

## 2022-01-30 LAB — TROPONIN I (HIGH SENSITIVITY)
Troponin I (High Sensitivity): 3 ng/L (ref <18)
Troponin I (High Sensitivity): 6 ng/L (ref <18)

## 2022-01-30 LAB — CBG MONITORING, ED: Glucose-Capillary: 125 mg/dL — ABNORMAL HIGH (ref 70–99)

## 2022-01-30 MED ORDER — POTASSIUM CHLORIDE CRYS ER 20 MEQ PO TBCR
40.0000 meq | EXTENDED_RELEASE_TABLET | Freq: Once | ORAL | Status: AC
  Administered 2022-01-30: 40 meq via ORAL
  Filled 2022-01-30: qty 2

## 2022-01-30 MED ORDER — ETOMIDATE 2 MG/ML IV SOLN
INTRAVENOUS | Status: AC
  Filled 2022-01-30: qty 10

## 2022-01-30 MED ORDER — ADENOSINE 6 MG/2ML IV SOLN
INTRAVENOUS | Status: AC
  Administered 2022-01-30: 6 mg
  Filled 2022-01-30: qty 2

## 2022-01-30 MED ORDER — MAGNESIUM SULFATE 2 GM/50ML IV SOLN
2.0000 g | Freq: Once | INTRAVENOUS | Status: AC
  Administered 2022-01-30: 2 g via INTRAVENOUS

## 2022-01-30 MED ORDER — ATENOLOL 50 MG PO TABS
50.0000 mg | ORAL_TABLET | Freq: Every day | ORAL | Status: DC
  Filled 2022-01-30: qty 2
  Filled 2022-01-30: qty 1

## 2022-01-30 MED ORDER — ASPIRIN 325 MG PO TABS
325.0000 mg | ORAL_TABLET | Freq: Every day | ORAL | Status: DC
  Administered 2022-01-30: 325 mg via ORAL
  Filled 2022-01-30: qty 1

## 2022-01-30 MED ORDER — SODIUM CHLORIDE 0.9 % IV BOLUS
1000.0000 mL | Freq: Once | INTRAVENOUS | Status: AC
  Administered 2022-01-30: 1000 mL via INTRAVENOUS

## 2022-01-30 MED ORDER — DILTIAZEM HCL 25 MG/5ML IV SOLN: 25.0000 mg | Freq: Once | INTRAVENOUS | Status: DC

## 2022-01-30 MED ORDER — LORATADINE 10 MG PO TABS
10.0000 mg | ORAL_TABLET | Freq: Every day | ORAL | Status: DC
  Administered 2022-01-31: 10 mg via ORAL
  Filled 2022-01-30: qty 1

## 2022-01-30 MED ORDER — FENTANYL CITRATE PF 50 MCG/ML IJ SOSY
PREFILLED_SYRINGE | INTRAMUSCULAR | Status: AC
  Filled 2022-01-30: qty 1

## 2022-01-30 MED ORDER — FAMOTIDINE 20 MG PO TABS
20.0000 mg | ORAL_TABLET | Freq: Two times a day (BID) | ORAL | Status: DC
  Administered 2022-01-31: 20 mg via ORAL
  Filled 2022-01-30 (×2): qty 1

## 2022-01-30 MED ORDER — SODIUM CHLORIDE 0.9 % IV SOLN: INTRAVENOUS | Status: DC

## 2022-01-30 MED ORDER — ONDANSETRON HCL 4 MG/2ML IJ SOLN: 4.0000 mg | Freq: Four times a day (QID) | INTRAMUSCULAR | Status: DC | PRN

## 2022-01-30 MED ORDER — IOHEXOL 350 MG/ML SOLN
100.0000 mL | Freq: Once | INTRAVENOUS | Status: AC | PRN
  Administered 2022-01-30: 100 mL via INTRAVENOUS

## 2022-01-30 MED ORDER — LEVOTHYROXINE SODIUM 100 MCG PO TABS
100.0000 ug | ORAL_TABLET | Freq: Every day | ORAL | Status: DC
  Administered 2022-01-31: 100 ug via ORAL
  Filled 2022-01-30: qty 1

## 2022-01-30 MED ORDER — ACETAMINOPHEN 325 MG PO TABS
650.0000 mg | ORAL_TABLET | ORAL | Status: DC | PRN
  Administered 2022-01-31 (×2): 650 mg via ORAL
  Filled 2022-01-30 (×2): qty 2

## 2022-01-30 MED ORDER — LORAZEPAM 2 MG/ML IJ SOLN
INTRAMUSCULAR | Status: AC
  Administered 2022-01-30: 1 mg
  Filled 2022-01-30: qty 1

## 2022-01-30 MED ORDER — ADENOSINE 6 MG/2ML IV SOLN
INTRAVENOUS | Status: AC
  Filled 2022-01-30: qty 2

## 2022-01-30 NOTE — Telephone Encounter (Signed)
Pt called back stating she has not heard anything back about the appeal for this procedure. Please advise.

## 2022-01-30 NOTE — ED Notes (Signed)
[facility] labs sent to main lab for save

## 2022-01-31 ENCOUNTER — Observation Stay (HOSPITAL_BASED_OUTPATIENT_CLINIC_OR_DEPARTMENT_OTHER): Payer: 59

## 2022-01-31 DIAGNOSIS — I471 Supraventricular tachycardia: Secondary | ICD-10-CM | POA: Diagnosis not present

## 2022-01-31 DIAGNOSIS — R079 Chest pain, unspecified: Secondary | ICD-10-CM

## 2022-01-31 LAB — BASIC METABOLIC PANEL
Anion gap: 6 (ref 5–15)
BUN: 9 mg/dL (ref 6–20)
CO2: 22 mmol/L (ref 22–32)
Calcium: 8.3 mg/dL — ABNORMAL LOW (ref 8.9–10.3)
Chloride: 112 mmol/L — ABNORMAL HIGH (ref 98–111)
Creatinine, Ser: 0.87 mg/dL (ref 0.44–1.00)
GFR, Estimated: >60 mL/min (ref >60)
Glucose, Bld: 124 mg/dL — ABNORMAL HIGH (ref 70–99)
Potassium: 3.6 mmol/L (ref 3.5–5.1)
Sodium: 140 mmol/L (ref 135–145)

## 2022-01-31 LAB — CBC
HCT: 37 % (ref 36.0–46.0)
Hemoglobin: 12.2 g/dL (ref 12.0–15.0)
MCH: 27.6 pg (ref 26.0–34.0)
MCHC: 33 g/dL (ref 30.0–36.0)
MCV: 83.7 fL (ref 80.0–100.0)
Platelets: 247 10*3/uL (ref 150–400)
RBC: 4.42 MIL/uL (ref 3.87–5.11)
RDW: 13.2 % (ref 11.5–15.5)
WBC: 9 10*3/uL (ref 4.0–10.5)
nRBC: 0 % (ref 0.0–0.2)

## 2022-01-31 LAB — ECHOCARDIOGRAM COMPLETE
AR max vel: 3.02 cm2
AV Peak grad: 5 mmHg
Ao pk vel: 1.12 m/s
Area-P 1/2: 3.42 cm2
Calc EF: 64.4 %
Height: 69 in
S' Lateral: 3 cm
Single Plane A2C EF: 64.5 %
Single Plane A4C EF: 63.6 %
Weight: 3940.06 oz

## 2022-01-31 LAB — SEDIMENTATION RATE: Sed Rate: 1 mm/hr (ref 0–22)

## 2022-01-31 LAB — HIV ANTIBODY (ROUTINE TESTING W REFLEX): HIV Screen 4th Generation wRfx: NONREACTIVE

## 2022-01-31 LAB — C-REACTIVE PROTEIN: CRP: 0.6 mg/dL (ref <1.0)

## 2022-01-31 LAB — LACTIC ACID, PLASMA: Lactic Acid, Venous: 0.9 mmol/L (ref 0.5–1.9)

## 2022-01-31 MED ORDER — OYSTER SHELL CALCIUM/D3 500-5 MG-MCG PO TABS
1.0000 | ORAL_TABLET | Freq: Every day | ORAL | Status: DC
  Administered 2022-01-31: 1 via ORAL
  Filled 2022-01-31: qty 1

## 2022-01-31 MED ORDER — MAGNESIUM OXIDE -MG SUPPLEMENT 400 (240 MG) MG PO TABS: 400.0000 mg | ORAL_TABLET | Freq: Every day | ORAL | Status: DC

## 2022-01-31 MED ORDER — ATENOLOL 25 MG PO TABS: 75.0000 mg | ORAL_TABLET | Freq: Every day | ORAL | 6 refills | Status: DC

## 2022-01-31 MED ORDER — LEVOTHYROXINE SODIUM 125 MCG PO TABS: 125.0000 ug | ORAL_TABLET | Freq: Every day | ORAL | 1 refills | Status: DC

## 2022-01-31 MED ORDER — CLONAZEPAM 0.5 MG PO TABS
0.5000 mg | ORAL_TABLET | Freq: Every day | ORAL | Status: DC | PRN
  Administered 2022-01-31: 0.5 mg via ORAL

## 2022-01-31 MED ORDER — CLONAZEPAM 0.5 MG PO TABS
0.5000 mg | ORAL_TABLET | Freq: Every day | ORAL | Status: DC
  Filled 2022-01-31: qty 1

## 2022-01-31 NOTE — Discharge Summary (Signed)
Discharge Summary    Patient ID: Sheri Johnson MRN: [id number]; DOB: 1985/10/06  Admit date: 01/30/2022 Discharge date: 01/31/2022  PCP:  [facility] [place] [facility] Cardiologist:  Kirk Ruths, MD  Electrophysiologist:  Cristopher Peru, MD       Discharge Diagnoses    Principal Problem:   SVT (supraventricular tachycardia) [facility]) Active Problems:   Chest pain    Diagnostic Studies/Procedures    ECHO: 01/31/2022  1. Left ventricular ejection fraction, by estimation, is 60 to 65%. The  left ventricle has normal function. The left ventricle has no regional  wall motion abnormalities. Left ventricular diastolic parameters were  normal.   2. Right ventricular systolic function is normal. The right ventricular  size is normal. There is normal pulmonary artery systolic pressure. The  estimated right ventricular systolic pressure is 0000000 mmHg.   3. The mitral valve is normal in structure. No evidence of mitral valve  regurgitation. No evidence of mitral stenosis.   4. The aortic valve is tricuspid. Aortic valve regurgitation is not  visualized. No aortic stenosis is present.   5. The inferior vena cava is normal in size with greater than 50%  respiratory variability, suggesting right atrial pressure of 3 mmHg.  _____________   History of Present Illness     Sheri Johnson is a 36 y.o. female with SVT, inappropriate sinus tachycardia, atypical chest pain, hypertension, hypothyroidism, and asthma who was admitted 01/30/2022 for the evaluation of SVT and chest discomfort.  Hospital Course     Consultants: None  She was recently seen in the emergency department and in clinic for similar symptoms.  However notes this time she has what feels like a sinus infection and left-sided neck and face pain with some numbness in her arms and legs.  Notes that she has a constant intermittent pain under her left breast that wraps around to her back.   In the past, she was treated with anti-inflammatory medications for suspected pericarditis.   Today, during the symptoms, she had another episode of SVT that led to a near syncopal episode.  She has been having intermittent episodes of SVT since last year.  She has taken beta-blocker which has limited some of her frequency of SVT.   Presented today due to constellation of symptoms and concerns.  In the ER, she was in SVT, and she was given volume and adenosine to break her rapid rhythm.   When seen by the fellow, she was hemodynamically stable and in a sinus tachycardia in the 110s.  She still was having some mild reproducible pain in her left side and back.    She was admitted, continued on her atenolol and hydrated.  There was concern that the SVT was triggered by ongoing sinus infection and nerve pain.  As this improves, so should her symptoms. Her atenolol was increased from 50 up to 75 mg daily.  CT was performed because she reported some abdominal pain.  It showed diverticulosis, but not diverticulitis.  It is not felt that the abdominal pain is contributing to her SVT.  She also reported maroon stools.  She needs to follow-up with her physician for that.  Her hemoglobin today was 12.2 which is down from admission.  However, based on her BUN and creatinine, she was a little dehydrated on admission and was given IV fluids.  The 12.2 is lower than her normal which is generally greater than 13, but may also be partly dilutional.  Cardiac  enzymes were negative for MI and her echo was reviewed.  No coronary calcium was seen on the CT.  No further ischemic work-up is indicated at this time.  Her TSH was checked and was elevated.  She was previously on Synthroid 100 mcg daily.  This will be increased to 125 mcg daily, follow-up with PCP for further dose adjustments.  Her potassium was 3.1 on admission and was supplemented.  Magnesium was 2.0, continue current supplement, and LFTs were within  normal limits.  CRP and sed rate were within normal limits, arguing against pericarditis.   She plans to get an SVT ablation but it has been denied by her insurance.  Given that this is now her third admission in 1 month with SVT, hopefully she can proceed with this on an outpatient basis.  Follow-up with Dr. Lovena Le.  On 01/31/2022, she was seen by Dr. Oval Linsey and all data were reviewed.  No further inpatient work-up is indicated and she is considered stable for discharge, to follow-up as an outpatient.   Did the patient have an acute coronary syndrome (MI, NSTEMI, STEMI, etc) this admission?:  No                               Did the patient have a percutaneous coronary intervention (stent / angioplasty)?:  No.     _____________  Discharge Vitals Blood pressure 121/74, pulse 88, temperature 98.2 F (36.8 C), temperature source Oral, resp. rate 19, height 5\' 9"  (1.753 m), weight 111.7 kg, last menstrual period 12/29/2021, SpO2 99 %.  Filed Weights   01/31/22 0120  Weight: 111.7 kg    Labs & Radiologic Studies    CBC Recent Labs    01/30/22 1710 01/31/22 0248  WBC 12.9* 9.0  NEUTROABS 7.3  --   HGB 15.2* 12.2  HCT 43.7 37.0  MCV 82.3 83.7  PLT 336 [id number]   Basic Metabolic Panel Recent Labs    01/30/22 1710 01/30/22 1730 01/31/22 0248  NA 141  --  140  K 3.1*  --  3.6  CL 105  --  112*  CO2 20*  --  22  GLUCOSE 125*  --  124*  BUN 11  --  9  CREATININE 1.19*  --  0.87  CALCIUM 9.9  --  8.3*  MG  --  2.0  --    Liver Function Tests Recent Labs    01/30/22 1710  AST 22  ALT 24  ALKPHOS 42  BILITOT 0.5  PROT 7.2  ALBUMIN 4.5   High Sensitivity Troponin:   Recent Labs  Lab 01/19/22 0815 01/25/22 1931 01/30/22 1719 01/30/22 2002  TROPONINIHS <2 2 3 6     BNP    Component Value Date/Time   BNP 24.5 01/30/2022 1719   D-Dimer Recent Labs    01/30/22 1719  DDIMER 0.48   Thyroid Function Tests Recent Labs    01/30/22 1730  TSH 10.935*   Free  T4  Date Value Ref Range Status  01/30/2022 0.68 0.61 - 1.12 ng/dL Final    Comment:    (NOTE) Biotin ingestion may interfere with free T4 tests. If the results are inconsistent with the TSH level, previous test results, or the clinical presentation, then consider biotin interference. If needed, order repeat testing after stopping biotin. Performed at [facility], [place] [address] [place], [place] [address]    Lab Results  Component Value Date  ESRSEDRATE 1 01/31/2022      Component Value Date/Time   CRP 0.6 01/31/2022 0248     _____________  ECHOCARDIOGRAM COMPLETE  Result Date: 01/31/2022    ECHOCARDIOGRAM REPORT   Patient Name:   MOSELLA KASA Date of Exam: 01/31/2022 Medical Rec #:  [id number]       Height:       69.0 in Accession #:    [id number]      Weight:       246.3 lb Date of Birth:  June 28, 1986       BSA:          2.257 m Patient Age:    36 years        BP:           133/75 mmHg Patient Gender: F               HR:           78 bpm. Exam Location:  Inpatient Procedure: 2D Echo, Cardiac Doppler and Color Doppler Indications:    Chest pain  History:        Patient has prior history of Echocardiogram examinations. Risk                 Factors:Hypertension.  Sonographer:    Cleatis Polka Referring Phys: [id number] TIFFANY [place] IMPRESSIONS  1. Left ventricular ejection fraction, by estimation, is 60 to 65%. The left ventricle has normal function. The left ventricle has no regional wall motion abnormalities. Left ventricular diastolic parameters were normal.  2. Right ventricular systolic function is normal. The right ventricular size is normal. There is normal pulmonary artery systolic pressure. The estimated right ventricular systolic pressure is 20.0 mmHg.  3. The mitral valve is normal in structure. No evidence of mitral valve regurgitation. No evidence of mitral stenosis.  4. The aortic valve is tricuspid. Aortic valve regurgitation is not visualized. No aortic stenosis is  present.  5. The inferior vena cava is normal in size with greater than 50% respiratory variability, suggesting right atrial pressure of 3 mmHg. FINDINGS  Left Ventricle: Left ventricular ejection fraction, by estimation, is 60 to 65%. The left ventricle has normal function. The left ventricle has no regional wall motion abnormalities. The left ventricular internal cavity size was normal in size. There is  no left ventricular hypertrophy. Left ventricular diastolic parameters were normal. Normal left ventricular filling pressure. Right Ventricle: The right ventricular size is normal. No increase in right ventricular wall thickness. Right ventricular systolic function is normal. There is normal pulmonary artery systolic pressure. The tricuspid regurgitant velocity is 2.06 m/s, and  with an assumed right atrial pressure of 3 mmHg, the estimated right ventricular systolic pressure is 20.0 mmHg. Left Atrium: Left atrial size was normal in size. Right Atrium: Right atrial size was normal in size. Pericardium: There is no evidence of pericardial effusion. Mitral Valve: The mitral valve is normal in structure. No evidence of mitral valve regurgitation. No evidence of mitral valve stenosis. Tricuspid Valve: The tricuspid valve is normal in structure. Tricuspid valve regurgitation is trivial. No evidence of tricuspid stenosis. Aortic Valve: The aortic valve is tricuspid. Aortic valve regurgitation is not visualized. No aortic stenosis is present. Aortic valve peak gradient measures 5.0 mmHg. Pulmonic Valve: The pulmonic valve was normal in structure. Pulmonic valve regurgitation is not visualized. No evidence of pulmonic stenosis. Aorta: The aortic root is normal in size and structure. Venous: The inferior vena cava is normal in size with greater  than 50% respiratory variability, suggesting right atrial pressure of 3 mmHg. IAS/Shunts: No atrial level shunt detected by color flow Doppler.  LEFT VENTRICLE PLAX 2D LVIDd:          4.80 cm      Diastology LVIDs:         3.00 cm      LV e' medial:    8.49 cm/s LV PW:         1.30 cm      LV E/e' medial:  6.1 LV IVS:        1.10 cm      LV e' lateral:   10.60 cm/s LVOT diam:     2.20 cm      LV E/e' lateral: 4.8 LV SV:         70 LV SV Index:   31 LVOT Area:     3.80 cm  LV Volumes (MOD) LV vol d, MOD A2C: 89.8 ml LV vol d, MOD A4C: 115.0 ml LV vol s, MOD A2C: 31.9 ml LV vol s, MOD A4C: 41.9 ml LV SV MOD A2C:     57.9 ml LV SV MOD A4C:     115.0 ml LV SV MOD BP:      66.5 ml RIGHT VENTRICLE             IVC RV Basal diam:  2.70 cm     IVC diam: 1.70 cm RV Mid diam:    2.20 cm RV S prime:     10.30 cm/s TAPSE (M-mode): 2.5 cm LEFT ATRIUM             Index        RIGHT ATRIUM           Index LA diam:        3.50 cm 1.55 cm/m   RA Area:     12.50 cm LA Vol (A2C):   41.4 ml 18.34 ml/m  RA Volume:   25.20 ml  11.17 ml/m LA Vol (A4C):   42.2 ml 18.70 ml/m LA Biplane Vol: 42.0 ml 18.61 ml/m  AORTIC VALVE AV Area (Vmax): 3.02 cm AV Vmax:        112.00 cm/s AV Peak Grad:   5.0 mmHg LVOT Vmax:      88.90 cm/s LVOT Vmean:     65.800 cm/s LVOT VTI:       0.185 m  AORTA Ao Root diam: 3.40 cm Ao Asc diam:  3.00 cm MITRAL VALVE               TRICUSPID VALVE MV Area (PHT): 3.42 cm    TR Peak grad:   17.0 mmHg MV Decel Time: 222 msec    TR Vmax:        206.00 cm/s MV E velocity: 51.40 cm/s MV A velocity: 38.60 cm/s  SHUNTS MV E/A ratio:  1.33        Systemic VTI:  0.18 m                            Systemic Diam: 2.20 cm Dani Gobble Croitoru MD Electronically signed by Sanda Klein MD Signature Date/Time: 01/31/2022/3:21:53 PM    Final    CT HEAD WO CONTRAST (5MM)  Result Date: 01/30/2022 CLINICAL DATA:  Neuro deficit, acute, stroke suspected EXAM: CT HEAD WITHOUT CONTRAST TECHNIQUE: Contiguous axial images were obtained from the base of the skull through the vertex without intravenous contrast. RADIATION  DOSE REDUCTION: This exam was performed according to the departmental dose-optimization program which  includes automated exposure control, adjustment of the mA and/or kV according to patient size and/or use of iterative reconstruction technique. COMPARISON:  None Available. FINDINGS: Brain: Normal anatomic configuration. No abnormal intra or extra-axial mass lesion or fluid collection. No abnormal mass effect or midline shift. No evidence of acute intracranial hemorrhage or infarct. Ventricular size is normal. Cerebellum unremarkable. Vascular: Unremarkable Skull: Intact Sinuses/Orbits: Paranasal sinuses are clear. Orbits are unremarkable. Other: Mastoid air cells and middle ear cavities are clear. IMPRESSION: No acute intracranial hemorrhage or infarct. Electronically Signed   By: Fidela Salisbury M.D.   On: 01/30/2022 23:35   DG Chest Portable 1 View  Result Date: 01/30/2022 CLINICAL DATA:  Chest pain. EXAM: PORTABLE CHEST 1 VIEW COMPARISON:  Chest x-ray 01/25/2022 FINDINGS: Multiple artifacts overlie the chest. The heart size and mediastinal contours are within normal limits. Both lungs are clear. The visualized skeletal structures are unremarkable. IMPRESSION: No active disease. Electronically Signed   By: Ronney Asters M.D.   On: 01/30/2022 18:41   CT Angio Chest/Abd/Pel for Dissection W and/or Wo Contrast  Result Date: 01/30/2022 CLINICAL DATA:  Acute aortic syndrome.  SVT.  Shortness of breath. EXAM: CT ANGIOGRAPHY CHEST, ABDOMEN AND PELVIS TECHNIQUE: Non-contrast CT of the chest was initially obtained. Multidetector CT imaging through the chest, abdomen and pelvis was performed using the standard protocol during bolus administration of intravenous contrast. Multiplanar reconstructed images and MIPs were obtained and reviewed to evaluate the vascular anatomy. RADIATION DOSE REDUCTION: This exam was performed according to the departmental dose-optimization program which includes automated exposure control, adjustment of the mA and/or kV according to patient size and/or use of iterative reconstruction  technique. CONTRAST:  154mL OMNIPAQUE IOHEXOL 350 MG/ML SOLN COMPARISON:  CT chest abdomen and pelvis 11/25/2021 FINDINGS: CTA CHEST FINDINGS Cardiovascular: Preferential opacification of the thoracic aorta. No evidence of thoracic aortic aneurysm or dissection. Heart is mildly enlarged. No pericardial effusion. Mediastinum/Nodes: No enlarged mediastinal, hilar, or axillary lymph nodes. Thyroid gland, trachea, and esophagus demonstrate no significant findings. Lungs/Pleura: There are minimal patchy ground-glass opacities in the bilateral lower lobes. The lungs are otherwise clear. There is no pleural effusion or pneumothorax. Musculoskeletal: No chest wall abnormality. No acute or significant osseous findings. Review of the MIP images confirms the above findings. CTA ABDOMEN AND PELVIS FINDINGS VASCULAR Aorta: Normal caliber aorta without aneurysm, dissection, vasculitis or significant stenosis. Celiac: Patent without evidence of aneurysm, dissection, vasculitis or significant stenosis. SMA: Patent without evidence of aneurysm, dissection, vasculitis or significant stenosis. Renals: Both renal arteries are patent without evidence of aneurysm, dissection, vasculitis, fibromuscular dysplasia or significant stenosis. There are accessory renal arteries bilaterally. IMA: Patent without evidence of aneurysm, dissection, vasculitis or significant stenosis. Inflow: Patent without evidence of aneurysm, dissection, vasculitis or significant stenosis. Veins: No obvious venous abnormality within the limitations of this arterial phase study. Review of the MIP images confirms the above findings. NON-VASCULAR Hepatobiliary: No focal liver abnormality is seen. No gallstones, gallbladder wall thickening, or biliary dilatation. Pancreas: Unremarkable. No pancreatic ductal dilatation or surrounding inflammatory changes. Spleen: Normal in size without focal abnormality. Adrenals/Urinary Tract: Adrenal glands are unremarkable. Kidneys  are normal, without renal calculi, focal lesion, or hydronephrosis. Bladder is unremarkable. Stomach/Bowel: Stomach is within normal limits. No evidence of bowel wall thickening, distention, or inflammatory changes. Appendix is surgically absent. There is sigmoid colon diverticulosis. Lymphatic: No enlarged lymph nodes are identified. Reproductive: Uterus and bilateral adnexa  are unremarkable. Other: There is a small fat containing umbilical hernia. No abdominopelvic ascites. Musculoskeletal: No acute or significant osseous findings. Review of the MIP images confirms the above findings. IMPRESSION: 1. No acute vascular pathology in the chest, abdomen or pelvis. 2. Minimal ground-glass opacities in the bilateral lower lobes, nonspecific. Findings may be related to mild edema or infection. 3. Mild cardiomegaly. 4. Sigmoid colon diverticulosis. Electronically Signed   By: Darliss Cheney M.D.   On: 01/30/2022 18:39   Disposition   Pt is being discharged home today in good condition.  Follow-up Plans & Appointments     Follow-up Information     Marinus Maw, MD Follow up.   Specialty: Cardiology Why: The office will call. Contact information: [address] [place] [address] [phone]         [facility]. Schedule an appointment as soon as possible for a visit.   Specialty: Family Medicine Why: You need to be seen in 2-3 weeks, please let them know you are having dark stools. Contact information: [address] [place] [address] [phone]         Lewayne Bunting, MD Follow up.   Specialty: Cardiology Why: As scheduled Contact information: [address] [place] [address] [phone]                Discharge Instructions     Diet - low sodium heart healthy   Complete by: As directed    Increase activity slowly   Complete by: As directed        Discharge  Medications   Allergies as of 01/31/2022       Reactions   Metoprolol Other (See Comments)   dizziness   Cipro [ciprofloxacin Hcl] Palpitations   Prednisone Palpitations        Medication List     STOP taking these medications    amoxicillin-clavulanate 875-125 MG tablet Commonly known as: AUGMENTIN   ivabradine 5 MG Tabs tablet Commonly known as: CORLANOR       TAKE these medications    acetaminophen 325 MG tablet Commonly known as: Tylenol You can take 2 tablets every 6 hours for pain.  Your prescribed pain medicine also has Tylenol(acetaminophen) in it.  Count each one of those as a Tylenol tablet.  Do not take more than 4000 mg of Tylenol(acetaminophen) per day. What changed:  how much to take how to take this when to take this reasons to take this   atenolol 25 MG tablet Commonly known as: TENORMIN Take 3 tablets (75 mg total) by mouth at bedtime. What changed: how much to take   Atrovent HFA 17 MCG/ACT inhaler Generic drug: ipratropium Inhale 1 puff into the lungs every 6 (six) hours as needed for wheezing.   b complex vitamins capsule Take 1 capsule by mouth daily.   BLOOD SUGAR BALANCE PO Take 2 capsules by mouth daily.   cetirizine 10 MG tablet Commonly known as: ZYRTEC Take 10 mg by mouth 2 (two) times daily.   clonazePAM 0.5 MG tablet Commonly known as: KLONOPIN Take 0.5 mg by mouth daily.   cyclobenzaprine 10 MG tablet Commonly known as: FLEXERIL Take 10 mg by mouth 3 (three) times daily as needed for muscle spasms.   Digestive Enzymes Caps Take 1 capsule by mouth 3 (three) times daily with meals.   diltiazem 30 MG tablet Commonly known as: Cardizem Take 1 tablet (30 mg  total) by mouth daily as needed (palpitations).   famotidine 20 MG tablet Commonly known as: PEPCID Take 20 mg by mouth 2 (two) times daily.   ferrous sulfate 325 (65 FE) MG tablet Take 325 mg by mouth 3 (three) times a week.   Fish Oil 1000 MG Caps Take 1,000  mg by mouth in the morning and at bedtime.   fluticasone 50 MCG/ACT nasal spray Commonly known as: FLONASE Place 1 spray into both nostrils daily as needed for allergies or rhinitis.   ibuprofen 200 MG tablet Commonly known as: ADVIL You can take 2-3 tablets every 6 hours as needed for pain. What changed:  how much to take how to take this when to take this reasons to take this additional instructions   indomethacin 50 MG capsule Commonly known as: INDOCIN Take 1 capsule (50 mg total) by mouth 3 (three) times daily with meals.   levothyroxine 125 MCG tablet Commonly known as: SYNTHROID Take 1 tablet (125 mcg total) by mouth daily before breakfast. What changed:  medication strength how much to take   Linzess 145 MCG Caps capsule Generic drug: linaclotide Take 145 mcg by mouth daily before breakfast.   Magnesium 400 MG Caps Take 1 capsule by mouth daily.   NAC 600 MG Caps Generic drug: Acetylcysteine Take 600 mg by mouth in the morning and at bedtime.   norethindrone 0.35 MG tablet Commonly known as: MICRONOR Take 1 tablet by mouth daily.   OVER THE COUNTER MEDICATION Take 2 tablets by mouth daily. Medication: Beet Root Supplement   OVER THE COUNTER MEDICATION Take 1 Capful by mouth daily. Medication: Lung Health   potassium chloride SA 20 MEQ tablet Commonly known as: KLOR-CON M Take 1 tablet (20 mEq total) by mouth daily.   PROBIOTIC DAILY PO Take 2 Capfuls by mouth daily.   Slow-Mag 64 MG Tbec SR tablet Generic drug: magnesium chloride Take 1 tablet (64 mg total) by mouth daily.   ZINC PLUS VITAMIN C PO Take 1 tablet by mouth daily. With Vit D           Outstanding Labs/Studies   None  Duration of Discharge Encounter   Greater than 30 minutes including physician time.  Signed, Rosaria Ferries, PA-C 01/31/2022, 5:40 PM

## 2022-01-31 NOTE — Progress Notes (Signed)
Mobility Specialist Progress Note:   01/31/22 1642  Mobility  Activity Ambulated independently in room  Level of Assistance Independent  Assistive Device None  Distance Ambulated (ft) 4 ft  Activity Response Tolerated fair  $Mobility charge 1 Mobility   Pt received in shower stating she felt like she was going to pass out. Sat pt at sink BP:128/ 81. PT stated she was feeling better but has "felt off" since her echocardiogram, RN notified.   [facility] Ayo Smoak Mobility Specialist

## 2022-01-31 NOTE — Plan of Care (Signed)
  Problem: Education: Goal: Knowledge of General Education information will improve Description: Including pain rating scale, medication(s)/side effects and non-pharmacologic comfort measures Outcome: Progressing   Problem: Health Behavior/Discharge Planning: Goal: Ability to manage health-related needs will improve Outcome: Not Applicable   Problem: Clinical Measurements: Goal: Ability to maintain clinical measurements within normal limits will improve Outcome: Completed/Met Goal: Will remain free from infection Outcome: Completed/Met Goal: Diagnostic test results will improve Outcome: Adequate for Discharge Goal: Respiratory complications will improve Outcome: Not Applicable Goal: Cardiovascular complication will be avoided Outcome: Adequate for Discharge   Problem: Activity: Goal: Risk for activity intolerance will decrease Outcome: Completed/Met   Problem: Nutrition: Goal: Adequate nutrition will be maintained Outcome: Completed/Met   Problem: Coping: Goal: Level of anxiety will decrease Outcome: Completed/Met   Problem: Elimination: Goal: Will not experience complications related to bowel motility Outcome: Completed/Met Goal: Will not experience complications related to urinary retention Outcome: Completed/Met   Problem: Pain Managment: Goal: General experience of comfort will improve Outcome: Completed/Met   Problem: Safety: Goal: Ability to remain free from injury will improve Outcome: Not Applicable   Problem: Skin Integrity: Goal: Risk for impaired skin integrity will decrease Outcome: Not Applicable

## 2022-02-01 ENCOUNTER — Encounter: Payer: Self-pay | Admitting: Cardiology

## 2022-02-01 ENCOUNTER — Ambulatory Visit: Payer: Medicaid Other | Admitting: Cardiology

## 2022-02-01 ENCOUNTER — Telehealth: Payer: Self-pay | Admitting: Cardiology

## 2022-02-01 NOTE — Telephone Encounter (Signed)
TOC scheduled for 02/08/22 at 1:55pm with Edd Fabian, NP.

## 2022-02-01 NOTE — Telephone Encounter (Signed)
LMTCB

## 2022-02-05 NOTE — Progress Notes (Unsigned)
Cardiology Clinic Note   Patient Name: [name] Date of Encounter: 02/08/2022  Primary Care Provider:  [facility] Primary Cardiologist:  Olga Millers, MD  Patient Profile    Sheri Johnson 36 year old female presents to the clinic today for follow-up evaluation of her essential hypertension and SVT.  Past Medical History    Past Medical History:  Diagnosis Date   Abortion 11/14/2021   Anxiety    Asthma    Hypertension    Hypothyroid    SVT (supraventricular tachycardia) (HCC)    Past Surgical History:  Procedure Laterality Date   CESAREAN SECTION     LAPAROSCOPIC APPENDECTOMY N/A 12/14/2016   Procedure: APPENDECTOMY LAPAROSCOPIC;  Surgeon: Griselda Miner, MD;  Location: [facility] OR;  Service: General;  Laterality: N/A;    Allergies  Allergies  Allergen Reactions   Metoprolol Other (See Comments)    dizziness   Cipro [Ciprofloxacin Hcl] Palpitations   Prednisone Palpitations    History of Present Illness    Sheri Johnson has a PMH of SVT, essential hypertension, acute appendicitis, palpitations, and chest discomfort.  She presented to the hospital on 01/30/2022 and was discharged on 01/31/2022.  She was noted to have SVT.  Her EKG showed sinus tachycardia and being 110 range.  She was hemodynamically stable.  She was noted to have mild reproducible pain in her left side and back.  She was admitted to the hospital continued on her atenolol and received IV hydration.  She was felt to have SVT triggered by ongoing sinus infection.  Her atenolol was increased from 50 mg up to 75 mg daily.  Her abdominal CT showed diverticulosis but no diverticulitis.  Her cardiac enzymes were negative and no coronary calcium was noted on her CT.  No further ischemic work-up was recommended.  Plan for SVT ablation was discussed.  She reported having issues with insurance approval and follow-up was planned with Dr. Ladona Ridgel.  She presents the clinic today  for follow-up evaluation states she continues to have throat fullness and discomfort under her left breast along her left side.  She reports having oxygen desaturation.  She presented to urgent care and was prescribed antibiotics for sinus infection.  She wakes up fatigued and reports that her partner notices she has episodes where she stops breathing.  We reviewed her recent emergency department visit and labs/diagnostic testing.  She expressed understanding.  Her stop bang is 6 and her Epworth score is 18.  She reports compliance with her medications.  I will order a split-night sleep study, continue her current medication, have her continue to refrain from caffeine and other triggers, and follow-up as scheduled.  I recommended that she move forward with endoscopy.  She continues to wait for her insurance to approve ablation.  Today she denies chest pain, shortness of breath, lower extremity edema,  palpitations, melena, hematuria, hemoptysis, diaphoresis, weakness, presyncope, syncope, orthopnea, and PND.   Home Medications    Prior to Admission medications   Medication Sig Start Date End Date Taking? Authorizing Provider  acetaminophen (TYLENOL) 325 MG tablet You can take 2 tablets every 6 hours for pain.  Your prescribed pain medicine also has Tylenol(acetaminophen) in it.  Count each one of those as a Tylenol tablet.  Do not take more than 4000 mg of Tylenol(acetaminophen) per day. Patient taking differently: Take 650 mg by mouth every 6 (six) hours as needed for moderate pain. You can take 2 tablets every 6 hours for pain.  Your prescribed pain medicine also has Tylenol(acetaminophen) in it.  Count each one of those as a Tylenol tablet.  Do not take more than 4000 mg of Tylenol(acetaminophen) per day. 12/15/16   Sherrie George, PA-C  Acetylcysteine (NAC) 600 MG CAPS Take 600 mg by mouth in the morning and at bedtime.    [provider]  atenolol (TENORMIN) 25 MG tablet Take 3 tablets  (75 mg total) by mouth at bedtime. 01/31/22   Barrett, Joline Salt, PA-C  ATROVENT HFA 17 MCG/ACT inhaler Inhale 1 puff into the lungs every 6 (six) hours as needed for wheezing. 08/23/21   [provider]  b complex vitamins capsule Take 1 capsule by mouth daily.    [provider]  cetirizine (ZYRTEC) 10 MG tablet Take 10 mg by mouth 2 (two) times daily.    [provider]  clonazePAM (KLONOPIN) 0.5 MG tablet Take 0.5 mg by mouth daily.    [provider]  cyclobenzaprine (FLEXERIL) 10 MG tablet Take 10 mg by mouth 3 (three) times daily as needed for muscle spasms. 07/07/21   [provider]  Digestive Enzymes CAPS Take 1 capsule by mouth 3 (three) times daily with meals.    [provider]  diltiazem (CARDIZEM) 30 MG tablet Take 1 tablet (30 mg total) by mouth daily as needed (palpitations). 12/14/21   Tereso Newcomer T, PA-C  famotidine (PEPCID) 20 MG tablet Take 20 mg by mouth 2 (two) times daily.    [provider]  ferrous sulfate 325 (65 FE) MG tablet Take 325 mg by mouth 3 (three) times a week.    [provider]  fluticasone (FLONASE) 50 MCG/ACT nasal spray Place 1 spray into both nostrils daily as needed for allergies or rhinitis.    [provider]  ibuprofen (ADVIL,MOTRIN) 200 MG tablet You can take 2-3 tablets every 6 hours as needed for pain. Patient taking differently: Take 200 mg by mouth every 6 (six) hours as needed for mild pain. 12/15/16   Sherrie George, PA-C  indomethacin (INDOCIN) 50 MG capsule Take 1 capsule (50 mg total) by mouth 3 (three) times daily with meals. Patient not taking: Reported on 01/30/2022 01/19/22   Derwood Kaplan, MD  levothyroxine (SYNTHROID) 125 MCG tablet Take 1 tablet (125 mcg total) by mouth daily before breakfast. 01/31/22   Barrett, Joline Salt, PA-C  linaclotide (LINZESS) 145 MCG CAPS capsule Take 145 mcg by mouth daily before breakfast.    [provider]  Magnesium 400  MG CAPS Take 1 capsule by mouth daily.    [provider]  magnesium chloride (SLOW-MAG) 64 MG TBEC SR tablet Take 1 tablet (64 mg total) by mouth daily. Patient not taking: Reported on 01/30/2022 10/17/21   Dione Booze, MD  Misc Natural Products (BLOOD SUGAR BALANCE PO) Take 2 capsules by mouth daily.    [provider]  norethindrone (MICRONOR) 0.35 MG tablet Take 1 tablet by mouth daily.    [provider]  Omega-3 Fatty Acids (FISH OIL) 1000 MG CAPS Take 1,000 mg by mouth in the morning and at bedtime.    [provider]  OVER THE COUNTER MEDICATION Take 2 tablets by mouth daily. Medication: Beet Root Supplement    [provider]  OVER THE COUNTER MEDICATION Take 1 Capful by mouth daily. Medication: Lung Health    [provider]  potassium chloride SA (KLOR-CON M) 20 MEQ tablet Take 1 tablet (20 mEq total) by mouth daily. 12/14/21  Tereso Newcomer T, PA-C  Probiotic Product (PROBIOTIC DAILY PO) Take 2 Capfuls by mouth daily.    [provider]  Zinc Oxide-Vitamin C (ZINC PLUS VITAMIN C PO) Take 1 tablet by mouth daily. With Vit D    [provider]    Family History    Family History  Problem Relation Age of Onset   Hypothyroidism Mother    She indicated that her mother is alive.  Social History    Social History   Socioeconomic History   Marital status: Single    Spouse name: Not on file   Number of children: 3   Years of education: Not on file   Highest education level: Not on file  Occupational History   Not on file  Tobacco Use   Smoking status: Never   Smokeless tobacco: Never  Vaping Use   Vaping Use: Never used  Substance and Sexual Activity   Alcohol use: Not Currently    Comment: Rare   Drug use: Never   Sexual activity: Not on file  Other Topics Concern   Not on file  Social History Narrative   Not on file   Social Determinants of Health   Financial Resource Strain: Not on file   Food Insecurity: Not on file  Transportation Needs: Not on file  Physical Activity: Not on file  Stress: Not on file  Social Connections: Not on file  Intimate Partner Violence: Not on file     Review of Systems    General:  No chills, fever, night sweats or weight changes.  Cardiovascular:  No chest pain, dyspnea on exertion, edema, orthopnea, palpitations, paroxysmal nocturnal dyspnea. Dermatological: No rash, lesions/masses Respiratory: No cough, dyspnea Urologic: No hematuria, dysuria Abdominal:   No nausea, vomiting, diarrhea, bright red blood per rectum, melena, or hematemesis Neurologic:  No visual changes, wkns, changes in mental status. All other systems reviewed and are otherwise negative except as noted above.  Physical Exam    VS:  BP 120/80 (BP Location: Left Arm)   Pulse 93   Ht 5\' 9"  (1.753 m)   Wt 239 lb 9.6 oz (108.7 kg)   LMP 12/29/2021 (Approximate)   SpO2 95%   BMI 35.38 kg/m  , BMI Body mass index is 35.38 kg/m. GEN: Well nourished, well developed, in no acute distress. HEENT: normal. Neck: Supple, no JVD, carotid bruits, or masses. Cardiac: RRR, no murmurs, rubs, or gallops. No clubbing, cyanosis, edema.  Radials/DP/PT 2+ and equal bilaterally.  Respiratory:  Respirations regular and unlabored, clear to auscultation bilaterally. GI: Soft, nontender, nondistended, BS + x 4. MS: no deformity or atrophy. Skin: warm and dry, no rash. Neuro:  Strength and sensation are intact. Psych: Normal affect.  Accessory Clinical Findings    Recent Labs: 01/30/2022: ALT 24; B Natriuretic Peptide 24.5; Magnesium 2.0; TSH 10.935 01/31/2022: BUN 9; Creatinine, Ser 0.87; Hemoglobin 12.2; Platelets 247; Potassium 3.6; Sodium 140   Recent Lipid Panel No results found for: "CHOL", "TRIG", "HDL", "CHOLHDL", "VLDL", "LDLCALC", "LDLDIRECT"  ECG personally reviewed by me today-none today.  Echocardiogram 01/31/2022  IMPRESSIONS     1. Left ventricular ejection  fraction, by estimation, is 60 to 65%. The  left ventricle has normal function. The left ventricle has no regional  wall motion abnormalities. Left ventricular diastolic parameters were  normal.   2. Right ventricular systolic function is normal. The right ventricular  size is normal. There is normal pulmonary artery systolic pressure. The  estimated right ventricular systolic pressure  is 20.0 mmHg.   3. The mitral valve is normal in structure. No evidence of mitral valve  regurgitation. No evidence of mitral stenosis.   4. The aortic valve is tricuspid. Aortic valve regurgitation is not  visualized. No aortic stenosis is present.   5. The inferior vena cava is normal in size with greater than 50%  respiratory variability, suggesting right atrial pressure of 3 mmHg.   FINDINGS   Left Ventricle: Left ventricular ejection fraction, by estimation, is 60  to 65%. The left ventricle has normal function. The left ventricle has no  regional wall motion abnormalities. The left ventricular internal cavity  size was normal in size. There is   no left ventricular hypertrophy. Left ventricular diastolic parameters  were normal. Normal left ventricular filling pressure.   Right Ventricle: The right ventricular size is normal. No increase in  right ventricular wall thickness. Right ventricular systolic function is  normal. There is normal pulmonary artery systolic pressure. The tricuspid  regurgitant velocity is 2.06 m/s, and   with an assumed right atrial pressure of 3 mmHg, the estimated right  ventricular systolic pressure is 20.0 mmHg.   Left Atrium: Left atrial size was normal in size.   Right Atrium: Right atrial size was normal in size.   Pericardium: There is no evidence of pericardial effusion.   Mitral Valve: The mitral valve is normal in structure. No evidence of  mitral valve regurgitation. No evidence of mitral valve stenosis.   Tricuspid Valve: The tricuspid valve is normal in  structure. Tricuspid  valve regurgitation is trivial. No evidence of tricuspid stenosis.   Aortic Valve: The aortic valve is tricuspid. Aortic valve regurgitation is  not visualized. No aortic stenosis is present. Aortic valve peak gradient  measures 5.0 mmHg.   Pulmonic Valve: The pulmonic valve was normal in structure. Pulmonic valve  regurgitation is not visualized. No evidence of pulmonic stenosis.   Aorta: The aortic root is normal in size and structure.   Venous: The inferior vena cava is normal in size with greater than 50%  respiratory variability, suggesting right atrial pressure of 3 mmHg.   IAS/Shunts: No atrial level shunt detected by color flow Doppler.  Assessment & Plan   1.  SVT-heart rate today 93 and regular.  Tolerating increase atenolol well.  SVT felt to be triggered by prolonged sinus infection.  Also appears to be related to probable OSA.  Has not had insurance approval for ablation at this time. Continue atenolol Maintain p.o. hydration Heart healthy low-sodium diet Increase physical activity as tolerated Avoid triggers caffeine, chocolate, EtOH, dehydration etc. Follow-up with EP  Chest discomfort-chronic/stable.  Continues to have reproducible left-sided discomfort.  Previously received treatment with anti-inflammatory medications for suspected pericarditis.  Echocardiogram 01/31/2022 showed normal LVEF, trivial tricuspid valve regurgitation and no other significant abnormalities.  Appears to be related to acid reflux.  Has been seen by GI who recommended endoscopy. Follow-up with GI  Day time somnolence- reports apneic periods and oxygen desaturation while pulse oximetry.  She is not well rested when waking up.  STOP-BANG score 6.  Epworth score 18.  Order split-night sleep study  Disposition: Follow-up with Dr. Jens Som or me in 3-4 months.   Thomasene Ripple. Lizandra Zakrzewski NP-C    02/08/2022, 3:07 PM [facility] [address] Office [phone] Fax [phone]  Notice: This dictation was prepared with Dragon dictation along with smaller phrase technology. Any transcriptional errors that result from this process  are unintentional and may not be corrected upon review.  I spent 14 minutes examining this patient, reviewing medications, and using patient centered shared decision making involving her cardiac care.  Prior to her visit I spent greater than 20 minutes reviewing her past medical history,  medications, and prior cardiac tests.

## 2022-02-06 NOTE — Telephone Encounter (Signed)
LMTCB

## 2022-02-08 ENCOUNTER — Ambulatory Visit (INDEPENDENT_AMBULATORY_CARE_PROVIDER_SITE_OTHER): Payer: 59 | Admitting: General Practice

## 2022-02-08 ENCOUNTER — Encounter: Payer: Self-pay | Admitting: General Practice

## 2022-02-08 VITALS — BP 120/80 | HR 93 | Ht 69.0 in | Wt 239.6 lb

## 2022-02-08 DIAGNOSIS — I471 Supraventricular tachycardia: Secondary | ICD-10-CM

## 2022-02-08 DIAGNOSIS — R400 Somnolence: Secondary | ICD-10-CM | POA: Diagnosis not present

## 2022-02-08 DIAGNOSIS — R072 Precordial pain: Secondary | ICD-10-CM

## 2022-02-08 NOTE — Patient Instructions (Signed)
Medication Instructions:  ,CONT   *If you need a refill on your cardiac medications before your next appointment, please call your pharmacy*  Lab Work:   Testing/Procedures:  NONE    NONE If you have labs (blood work) drawn today and your tests are completely normal, you will receive your results only by: MyChart Message (if you have MyChart) OR  A paper copy in the mail If you have any lab test that is abnormal or we need to change your treatment, we will call you to review the results.  Special Instructions SOMEONE WILL BE CALLING YOU TO SCHEDULE YOUR SLEEP STUDY  MAINTAIN HYDRATION  OK FOR EGD TESTING  Follow-Up: Your next appointment:  KEEP SCHEDULED APPOINTMENT In Person with Olga Millers, MD     At [facility], you and your health needs are our priority.  As part of our continuing mission to provide you with exceptional heart care, we have created designated Provider Care Teams.  These Care Teams include your primary Cardiologist (physician) and Advanced Practice Providers (APPs -  Physician Assistants and Nurse Practitioners) who all work together to provide you with the care you need, when you need it.  Important Information About Sugar

## 2022-02-09 ENCOUNTER — Telehealth: Payer: Self-pay | Admitting: *Deleted

## 2022-02-09 NOTE — Telephone Encounter (Signed)
Prior Authorization for sleep study sent to [facility] via Phone. No PA is required. Reference # [id number] A. 02/09/22.

## 2022-02-11 ENCOUNTER — Encounter (HOSPITAL_BASED_OUTPATIENT_CLINIC_OR_DEPARTMENT_OTHER): Payer: Self-pay | Admitting: Emergency Medicine

## 2022-02-11 ENCOUNTER — Emergency Department (HOSPITAL_BASED_OUTPATIENT_CLINIC_OR_DEPARTMENT_OTHER)
Admission: EM | Admit: 2022-02-11 | Discharge: 2022-02-11 | Disposition: A | Discharge status: Home / Self Care | Payer: 59 | Attending: Emergency Medicine | Admitting: Emergency Medicine

## 2022-02-11 ENCOUNTER — Other Ambulatory Visit: Payer: Self-pay

## 2022-02-11 ENCOUNTER — Emergency Department (HOSPITAL_BASED_OUTPATIENT_CLINIC_OR_DEPARTMENT_OTHER): Payer: 59

## 2022-02-11 DIAGNOSIS — R42 Dizziness and giddiness: Secondary | ICD-10-CM | POA: Insufficient documentation

## 2022-02-11 DIAGNOSIS — E039 Hypothyroidism, unspecified: Secondary | ICD-10-CM | POA: Insufficient documentation

## 2022-02-11 DIAGNOSIS — I10 Essential (primary) hypertension: Secondary | ICD-10-CM | POA: Insufficient documentation

## 2022-02-11 DIAGNOSIS — R1310 Dysphagia, unspecified: Secondary | ICD-10-CM | POA: Insufficient documentation

## 2022-02-11 DIAGNOSIS — R0602 Shortness of breath: Secondary | ICD-10-CM | POA: Insufficient documentation

## 2022-02-11 DIAGNOSIS — Z79899 Other long term (current) drug therapy: Secondary | ICD-10-CM | POA: Diagnosis not present

## 2022-02-11 DIAGNOSIS — R519 Headache, unspecified: Secondary | ICD-10-CM | POA: Insufficient documentation

## 2022-02-11 DIAGNOSIS — R0789 Other chest pain: Secondary | ICD-10-CM | POA: Insufficient documentation

## 2022-02-11 DIAGNOSIS — J45909 Unspecified asthma, uncomplicated: Secondary | ICD-10-CM | POA: Diagnosis not present

## 2022-02-11 LAB — MAGNESIUM: Magnesium: 1.9 mg/dL (ref 1.7–2.4)

## 2022-02-11 LAB — CBC
HCT: 38.7 % (ref 36.0–46.0)
Hemoglobin: 13.6 g/dL (ref 12.0–15.0)
MCH: 28.5 pg (ref 26.0–34.0)
MCHC: 35.1 g/dL (ref 30.0–36.0)
MCV: 81 fL (ref 80.0–100.0)
Platelets: 313 10*3/uL (ref 150–400)
RBC: 4.78 MIL/uL (ref 3.87–5.11)
RDW: 12.9 % (ref 11.5–15.5)
WBC: 6.1 10*3/uL (ref 4.0–10.5)
nRBC: 0 % (ref 0.0–0.2)

## 2022-02-11 LAB — TROPONIN I (HIGH SENSITIVITY)
Troponin I (High Sensitivity): <2 ng/L (ref <18)
Troponin I (High Sensitivity): <2 ng/L (ref <18)

## 2022-02-11 LAB — BASIC METABOLIC PANEL
Anion gap: 7 (ref 5–15)
BUN: 12 mg/dL (ref 6–20)
CO2: 25 mmol/L (ref 22–32)
Calcium: 9.3 mg/dL (ref 8.9–10.3)
Chloride: 106 mmol/L (ref 98–111)
Creatinine, Ser: 0.95 mg/dL (ref 0.44–1.00)
GFR, Estimated: >60 mL/min (ref >60)
Glucose, Bld: 137 mg/dL — ABNORMAL HIGH (ref 70–99)
Potassium: 3.6 mmol/L (ref 3.5–5.1)
Sodium: 138 mmol/L (ref 135–145)

## 2022-02-11 LAB — TSH: TSH: 4.704 u[IU]/mL — ABNORMAL HIGH (ref 0.350–4.500)

## 2022-02-11 LAB — D-DIMER, QUANTITATIVE: D-Dimer, Quant: 0.27 ug/mL-FEU (ref 0.00–0.50)

## 2022-02-11 LAB — PREGNANCY, URINE: Preg Test, Ur: NEGATIVE

## 2022-02-11 MED ORDER — METOCLOPRAMIDE HCL 5 MG/ML IJ SOLN
10.0000 mg | Freq: Once | INTRAMUSCULAR | Status: AC
Start: 2022-02-11 — End: 2022-02-11
  Administered 2022-02-11: 10 mg via INTRAVENOUS
  Filled 2022-02-11: qty 2

## 2022-02-11 MED ORDER — DIPHENHYDRAMINE HCL 50 MG/ML IJ SOLN
25.0000 mg | Freq: Once | INTRAMUSCULAR | Status: AC
  Administered 2022-02-11: 25 mg via INTRAVENOUS
  Filled 2022-02-11: qty 1

## 2022-02-11 MED ORDER — ALUM & MAG HYDROXIDE-SIMETH 200-200-20 MG/5ML PO SUSP
30.0000 mL | Freq: Once | ORAL | Status: AC
  Administered 2022-02-11: 30 mL via ORAL
  Filled 2022-02-11: qty 30

## 2022-02-11 MED ORDER — KETOROLAC TROMETHAMINE 30 MG/ML IJ SOLN
30.0000 mg | Freq: Once | INTRAMUSCULAR | Status: AC
  Administered 2022-02-11: 30 mg via INTRAVENOUS
  Filled 2022-02-11: qty 1

## 2022-02-11 NOTE — Discharge Instructions (Signed)
Your testing is reassuring.  You declined chest x-ray today.  There is no evidence of heart attack or blood clot in the lung.  CT scan was obtained given your sudden onset severe headache which shows no evidence of bleeding or stroke.  Continue to follow-up with your primary doctor as well as her specialist regarding her difficulty swallowing.  Take your stomach medication as prescribed and avoid alcohol, caffeine, NSAID medications and spicy foods.  Return to the ED with exertional chest pain, pain associate with shortness of breath, vomiting, sweating, any other concerns.

## 2022-02-11 NOTE — ED Notes (Signed)
Patient states that she is having pain on her left side of chest and in her shoulder area

## 2022-02-11 NOTE — ED Notes (Signed)
Pt does not want chest x ray at this time. Will defer to provider

## 2022-02-11 NOTE — ED Notes (Signed)
Fluid challenge completed tolerated well

## 2022-02-11 NOTE — ED Provider Notes (Signed)
[facility] EMERGENCY DEPARTMENT Provider Note   CSN: [id number] Arrival date & time: 02/11/22  1130     History  Chief Complaint  Patient presents with   Dizziness   Chest Pain    Sheri Johnson is a 36 y.o. female.  Patient with multiple complaints.  She has a history of hypothyroidism, anxiety, SVT, hypertension and asthma.  States she been having issues with discomfort to her chest and shortness of breath for several weeks.  She was admitted to the hospital at the end of June for SVT.  She has had persistent pain to her upper chest and clavicle area since then with some shortness of breath.  States her pulse ox at home was reading in the low 80s multiple times this week and she has used albuterol several times.  Does not think that she has been coughing or having a fever.  Saw the cardiologist on July 3 and had a reassuring evaluation. She comes in today because she is concerned that she woke up feeling dizzy and lightheaded and had a sudden onset headache about 1 hour ago.  She was recently treated for sinusitis with 7 days of Zithromax which she completed.  Having a severe left-sided headache with ear pain and lightheadedness and dizziness. She is also concerned that she had difficulty swallowing for several weeks especially with pills.  She was referred to gastroenterology but is not seen them yet.  No vomiting or regurgitation.  No fevers.  No abdominal pain. Reports no change in her ongoing chest pain or shortness of breath.  The history is provided by the patient.  Dizziness Associated symptoms: chest pain, headaches and shortness of breath   Associated symptoms: no nausea and no vomiting   Chest Pain Associated symptoms: dizziness, dysphagia, headache and shortness of breath   Associated symptoms: no abdominal pain, no fever, no nausea and no vomiting        Home Medications Prior to Admission medications   Medication Sig Start Date End Date Taking?  Authorizing Provider  acetaminophen (TYLENOL) 325 MG tablet You can take 2 tablets every 6 hours for pain.  Your prescribed pain medicine also has Tylenol(acetaminophen) in it.  Count each one of those as a Tylenol tablet.  Do not take more than 4000 mg of Tylenol(acetaminophen) per day. Patient taking differently: Take 650 mg by mouth every 6 (six) hours as needed for moderate pain. You can take 2 tablets every 6 hours for pain.  Your prescribed pain medicine also has Tylenol(acetaminophen) in it.  Count each one of those as a Tylenol tablet.  Do not take more than 4000 mg of Tylenol(acetaminophen) per day. 12/15/16   Sherrie George, PA-C  Acetylcysteine (NAC) 600 MG CAPS Take 600 mg by mouth in the morning and at bedtime.    [provider]  atenolol (TENORMIN) 25 MG tablet Take 3 tablets (75 mg total) by mouth at bedtime. 01/31/22   Barrett, Joline Salt, PA-C  ATROVENT HFA 17 MCG/ACT inhaler Inhale 1 puff into the lungs every 6 (six) hours as needed for wheezing. 08/23/21   [provider]  b complex vitamins capsule Take 1 capsule by mouth daily.    [provider]  cetirizine (ZYRTEC) 10 MG tablet Take 10 mg by mouth 2 (two) times daily.    [provider]  clonazePAM (KLONOPIN) 0.5 MG tablet Take 0.5 mg by mouth daily.    [provider]  cyclobenzaprine (FLEXERIL) 10 MG tablet Take 10 mg  by mouth 3 (three) times daily as needed for muscle spasms. 07/07/21   [provider]  Digestive Enzymes CAPS Take 1 capsule by mouth 3 (three) times daily with meals.    [provider]  diltiazem (CARDIZEM) 30 MG tablet Take 1 tablet (30 mg total) by mouth daily as needed (palpitations). 12/14/21   Tereso Newcomer T, PA-C  famotidine (PEPCID) 20 MG tablet Take 20 mg by mouth 2 (two) times daily.    [provider]  ferrous sulfate 325 (65 FE) MG tablet Take 325 mg by mouth 3 (three) times a week.    [provider]  fluticasone  (FLONASE) 50 MCG/ACT nasal spray Place 1 spray into both nostrils daily as needed for allergies or rhinitis.    [provider]  ibuprofen (ADVIL,MOTRIN) 200 MG tablet You can take 2-3 tablets every 6 hours as needed for pain. Patient taking differently: Take 200 mg by mouth every 6 (six) hours as needed for mild pain. 12/15/16   Sherrie George, PA-C  levothyroxine (SYNTHROID) 125 MCG tablet Take 1 tablet (125 mcg total) by mouth daily before breakfast. 01/31/22   Barrett, Joline Salt, PA-C  linaclotide (LINZESS) 145 MCG CAPS capsule Take 145 mcg by mouth daily before breakfast.    [provider]  Magnesium 400 MG CAPS Take 1 capsule by mouth daily.    [provider]  magnesium chloride (SLOW-MAG) 64 MG TBEC SR tablet Take 1 tablet (64 mg total) by mouth daily. 10/17/21   Dione Booze, MD  Misc Natural Products (BLOOD SUGAR BALANCE PO) Take 2 capsules by mouth daily.    [provider]  norethindrone (MICRONOR) 0.35 MG tablet Take 1 tablet by mouth daily.    [provider]  Omega-3 Fatty Acids (FISH OIL) 1000 MG CAPS Take 1,000 mg by mouth in the morning and at bedtime.    [provider]  OVER THE COUNTER MEDICATION Take 2 tablets by mouth daily. Medication: Beet Root Supplement    [provider]  OVER THE COUNTER MEDICATION Take 1 Capful by mouth daily. Medication: Lung Health    [provider]  potassium chloride SA (KLOR-CON M) 20 MEQ tablet Take 1 tablet (20 mEq total) by mouth daily. 12/14/21   Tereso Newcomer T, PA-C  Probiotic Product (PROBIOTIC DAILY PO) Take 2 Capfuls by mouth daily.    [provider]  Zinc Oxide-Vitamin C (ZINC PLUS VITAMIN C PO) Take 1 tablet by mouth daily. With Vit D    [provider]      Allergies    Metoprolol, Cipro [ciprofloxacin hcl], and Prednisone    Review of Systems   Review of Systems  Constitutional:  Negative for activity change, appetite change and fever.   HENT:  Positive for sinus pain and trouble swallowing. Negative for congestion and facial swelling.   Respiratory:  Positive for chest tightness and shortness of breath.   Cardiovascular:  Positive for chest pain.  Gastrointestinal:  Negative for abdominal pain, nausea and vomiting.  Genitourinary:  Negative for dysuria, vaginal bleeding and vaginal discharge.  Musculoskeletal:  Negative for arthralgias and myalgias.  Skin:  Negative for rash.  Neurological:  Positive for dizziness and headaches.   all other systems are negative except as noted in the HPI and PMH.    Physical Exam Updated Vital Signs BP 130/80   Pulse 90   Temp 98.1 F (36.7 C) (Oral)   Resp (!) 22   LMP 12/29/2021 (Approximate)  SpO2 98%  Physical Exam Vitals and nursing note reviewed.  Constitutional:      General: She is not in acute distress.    Appearance: She is well-developed.     Comments: Anxious appearing  HENT:     Head: Normocephalic and atraumatic.     Right Ear: Tympanic membrane normal.     Left Ear: Tympanic membrane normal.     Nose: No rhinorrhea.     Mouth/Throat:     Pharynx: No oropharyngeal exudate or posterior oropharyngeal erythema.  Eyes:     Conjunctiva/sclera: Conjunctivae normal.     Pupils: Pupils are equal, round, and reactive to light.  Neck:     Comments: No meningismus. Cardiovascular:     Rate and Rhythm: Normal rate and regular rhythm.     Heart sounds: Normal heart sounds. No murmur heard. Pulmonary:     Effort: Pulmonary effort is normal. No respiratory distress.     Breath sounds: Normal breath sounds.  Chest:     Chest wall: Tenderness present.  Abdominal:     Palpations: Abdomen is soft.     Tenderness: There is no abdominal tenderness. There is no guarding or rebound.  Musculoskeletal:        General: No tenderness. Normal range of motion.     Cervical back: Normal range of motion and neck supple.  Skin:    General: Skin is warm.  Neurological:      Mental Status: She is alert and oriented to person, place, and time.     Cranial Nerves: No cranial nerve deficit.     Motor: No abnormal muscle tone.     Coordination: Coordination normal.     Comments:  5/5 strength throughout. CN 2-12 intact.Equal grip strength.   Psychiatric:        Behavior: Behavior normal.     ED Results / Procedures / Treatments   Labs (all labs ordered are listed, but only abnormal results are displayed) Labs Reviewed  BASIC METABOLIC PANEL - Abnormal; Notable for the following components:      Result Value   Glucose, Bld 137 (*)    All other components within normal limits  TSH - Abnormal; Notable for the following components:   TSH 4.704 (*)    All other components within normal limits  CBC  PREGNANCY, URINE  D-DIMER, QUANTITATIVE  MAGNESIUM  TROPONIN I (HIGH SENSITIVITY)  TROPONIN I (HIGH SENSITIVITY)    EKG EKG Interpretation  Date/Time:  Saturday February 11 2022 11:38:39 EDT Ventricular Rate:  91 PR Interval:  144 QRS Duration: 82 QT Interval:  362 QTC Calculation: 445 R Axis:   78 Text Interpretation: Sinus rhythm with occasional Premature ventricular complexes Otherwise normal ECG When compared with ECG of 30-Jan-2022 19:14, PREVIOUS ECG IS PRESENT No significant change was found Confirmed by Glynn Octave [phone]) on 02/11/2022 12:12:06 PM  Radiology CT Head Wo Contrast  Result Date: 02/11/2022 CLINICAL DATA:  Dizziness and lightheadedness this morning. EXAM: CT HEAD WITHOUT CONTRAST TECHNIQUE: Contiguous axial images were obtained from the base of the skull through the vertex without intravenous contrast. RADIATION DOSE REDUCTION: This exam was performed according to the departmental dose-optimization program which includes automated exposure control, adjustment of the mA and/or kV according to patient size and/or use of iterative reconstruction technique. COMPARISON:  January 30, 2022 FINDINGS: Brain: No evidence of acute infarction,  hemorrhage, hydrocephalus, extra-axial collection or mass lesion/mass effect. Vascular: No hyperdense vessel or unexpected calcification. Skull: Normal. Negative for fracture or  focal lesion. Sinuses/Orbits: Polypoid mucosal thickening in the right maxillary sinus. Other: None. IMPRESSION: 1. No acute intracranial abnormality. 2. Polypoid mucosal thickening in the right maxillary sinus. Electronically Signed   By: Ted Mcalpine M.D.   On: 02/11/2022 13:23    Procedures Procedures    Medications Ordered in ED Medications  alum & mag hydroxide-simeth (MAALOX/MYLANTA) 200-200-20 MG/5ML suspension 30 mL (has no administration in time range)  metoCLOPramide (REGLAN) injection 10 mg (has no administration in time range)  diphenhydrAMINE (BENADRYL) injection 25 mg (has no administration in time range)  ketorolac (TORADOL) 30 MG/ML injection 30 mg (has no administration in time range)    ED Course/ Medical Decision Making/ A&P                           Medical Decision Making Amount and/or Complexity of Data Reviewed Labs: ordered. Radiology: ordered and independent interpretation performed. Decision-making details documented in ED Course. ECG/medicine tests: ordered and independent interpretation performed. Decision-making details documented in ED Course.  Risk OTC drugs. Prescription drug management.   Ongoing central chest pain or shortness of breath for several weeks.  No hypoxia in the ED but reports hypoxia at home.  EKG shows sinus rhythm without acute ST changes.  Lungs are clear bilaterally without wheezing, hypoxia or increased work of breathing.  Does complain of sudden onset headache about 1 hour ago prior to coming in.  Will obtain CT scan to rule out subarachnoid hemorrhage  Patient states she recently had a chest x-ray and declines one again today. Troponin negative, D-dimer negative.  Low suspicion for ACS or pulmonary embolism or dissection. No hypoxia or increased  work of breathing seen throughout ED.  CT head is negative in the setting of sudden onset headache within 6 hours.  This effectively rules out subarachnoid hemorrhage.  Cardiology records reviewed..  Patient with recent extensive work-up including CT angiogram chest, echocardiogram.  No coronary calcifications were seen on CT and no further ischemic work-up was recommended.  Ejection fraction was normal.  Patient's chest pain today is atypical for ACS.  Troponin negative x2 with unchanged EKG.  D-dimer is negative with low suspicion for's aortic dissection or pulmonary embolism.   Headache resolved on recheck. Low suspicion for [facility] with negative CT head within 6 hours of sudden onset headache and normal neuro exam.   Tolerating PO. Has appointment coming up with GI for swallowing issues. Continue PPI. Avoid alcohol, caffeine, NSAIDs, spicy foods.   Vitals remain normal throughout ED course. Stable for discharge home with followup with specialists. Return precautions discussed.           Final Clinical Impression(s) / ED Diagnoses Final diagnoses:  Atypical chest pain  Bad headache    Rx / DC Orders ED Discharge Orders     None         Sione Baumgarten, Jeannett Senior, MD 02/11/22 1641

## 2022-02-11 NOTE — ED Triage Notes (Addendum)
Pt reports she woke up this morning dizzy, lightheaded, and "oxygen low." Now having chest pain. Denies shortness of breath but feels she is breathing shallow. On abx for ear infection. Recent Covid test negative. Also complains of L side HA.

## 2022-02-13 ENCOUNTER — Ambulatory Visit (HOSPITAL_COMMUNITY): Payer: 59

## 2022-02-15 ENCOUNTER — Encounter: Payer: Self-pay | Admitting: Internal Medicine

## 2022-02-24 ENCOUNTER — Telehealth: Payer: Self-pay

## 2022-02-24 NOTE — Telephone Encounter (Signed)
Outreach made to Pt.  Advised her SVT ablation appeal had been approved.  Will schedule for SVT ablation March 31, 2022.

## 2022-02-27 NOTE — Telephone Encounter (Signed)
This thread completed.  See newer documentation.

## 2022-02-28 ENCOUNTER — Emergency Department (HOSPITAL_BASED_OUTPATIENT_CLINIC_OR_DEPARTMENT_OTHER)
Admission: EM | Admit: 2022-02-28 | Discharge: 2022-02-28 | Disposition: A | Discharge status: Home / Self Care | Payer: 59 | Attending: Emergency Medicine | Admitting: Emergency Medicine

## 2022-02-28 ENCOUNTER — Emergency Department (HOSPITAL_BASED_OUTPATIENT_CLINIC_OR_DEPARTMENT_OTHER): Payer: 59

## 2022-02-28 ENCOUNTER — Encounter (HOSPITAL_BASED_OUTPATIENT_CLINIC_OR_DEPARTMENT_OTHER): Payer: Self-pay | Admitting: Emergency Medicine

## 2022-02-28 ENCOUNTER — Other Ambulatory Visit: Payer: Self-pay

## 2022-02-28 DIAGNOSIS — R0602 Shortness of breath: Secondary | ICD-10-CM | POA: Diagnosis not present

## 2022-02-28 DIAGNOSIS — R079 Chest pain, unspecified: Secondary | ICD-10-CM | POA: Insufficient documentation

## 2022-02-28 DIAGNOSIS — R002 Palpitations: Secondary | ICD-10-CM | POA: Insufficient documentation

## 2022-02-28 DIAGNOSIS — R42 Dizziness and giddiness: Secondary | ICD-10-CM | POA: Diagnosis not present

## 2022-02-28 DIAGNOSIS — R209 Unspecified disturbances of skin sensation: Secondary | ICD-10-CM | POA: Diagnosis not present

## 2022-02-28 DIAGNOSIS — R519 Headache, unspecified: Secondary | ICD-10-CM | POA: Diagnosis not present

## 2022-02-28 LAB — BASIC METABOLIC PANEL
Anion gap: 7 (ref 5–15)
BUN: 14 mg/dL (ref 6–20)
CO2: 26 mmol/L (ref 22–32)
Calcium: 9.3 mg/dL (ref 8.9–10.3)
Chloride: 105 mmol/L (ref 98–111)
Creatinine, Ser: 0.96 mg/dL (ref 0.44–1.00)
GFR, Estimated: >60 mL/min (ref >60)
Glucose, Bld: 122 mg/dL — ABNORMAL HIGH (ref 70–99)
Potassium: 4.2 mmol/L (ref 3.5–5.1)
Sodium: 138 mmol/L (ref 135–145)

## 2022-02-28 LAB — CBC
HCT: 39.3 % (ref 36.0–46.0)
Hemoglobin: 13.5 g/dL (ref 12.0–15.0)
MCH: 28.5 pg (ref 26.0–34.0)
MCHC: 34.4 g/dL (ref 30.0–36.0)
MCV: 83.1 fL (ref 80.0–100.0)
Platelets: 278 10*3/uL (ref 150–400)
RBC: 4.73 MIL/uL (ref 3.87–5.11)
RDW: 13.2 % (ref 11.5–15.5)
WBC: 10.2 10*3/uL (ref 4.0–10.5)
nRBC: 0 % (ref 0.0–0.2)

## 2022-02-28 LAB — PREGNANCY, URINE: Preg Test, Ur: NEGATIVE

## 2022-02-28 LAB — TROPONIN I (HIGH SENSITIVITY): Troponin I (High Sensitivity): <2 ng/L (ref <18)

## 2022-02-28 NOTE — Discharge Instructions (Addendum)
Follow-up with your primary care doctor regarding the symptoms for alternate testing options.  Return to the emergency room if you have any worsening symptoms or change your mind about the CT scan.

## 2022-02-28 NOTE — ED Notes (Signed)
Pt hooked up to the monitor with the 5 lead, BP cuff and pulse ox ?

## 2022-02-28 NOTE — ED Triage Notes (Signed)
Patient reports left sided chest pain radiating under her arm on the left side, also reports posterior head pain, fatigue, dizziness, and feels like her heart is skipping beats.

## 2022-02-28 NOTE — ED Notes (Signed)
Pt also c/o posterior headache with at time left sided numbness. Rates pain 7/10

## 2022-02-28 NOTE — ED Provider Notes (Signed)
[facility] EMERGENCY DEPARTMENT Provider Note   CSN: [id number] Arrival date & time: 02/28/22  1936     History  Chief Complaint  Patient presents with   Chest Pain    Sheri Johnson is a 36 y.o. female.  Patient is a 36 year old female who presents with multiple complaints.  She has a history of SVT and says she has been having some palpitations.  She has some intermittent discomfort in her chest but more just having the palpitations.  Sometimes she gets short of breath with it.  Her main concern currently is headache.  She has a 3-day history of pain to the left side of her head.  She also has some pain in the left side of her neck.  It is worse with movement.  She does state if she turns her head to the left she gets dizzy at times.  She has some numbness in her left arm and left leg which has been going on for several months intermittently and is unchanged.  No weakness on that side.  No speech deficits or vision changes.  Her chart was reviewed.  She has had frequent visits recently.  She was seen most recently on July 8 for chest pain and shortness of breath.  She also had a left-sided headache at that time and had a CT scan which showed no acute abnormality.  She had an admission to the hospital on June 26 due to SVT and ongoing chest pain.  She is set up for an ablation with cardiology.  She did have a CTA of her chest at that point because she had some left side numbness and weakness.  This was negative for dissection.  She was seen in March 2023 for chest pain and left-sided numbness.  She had an MRI of her brain that was normal.       Home Medications Prior to Admission medications   Medication Sig Start Date End Date Taking? Authorizing Provider  acetaminophen (TYLENOL) 325 MG tablet You can take 2 tablets every 6 hours for pain.  Your prescribed pain medicine also has Tylenol(acetaminophen) in it.  Count each one of those as a Tylenol tablet.  Do not take more than  4000 mg of Tylenol(acetaminophen) per day. Patient taking differently: Take 650 mg by mouth every 6 (six) hours as needed for moderate pain. You can take 2 tablets every 6 hours for pain.  Your prescribed pain medicine also has Tylenol(acetaminophen) in it.  Count each one of those as a Tylenol tablet.  Do not take more than 4000 mg of Tylenol(acetaminophen) per day. 12/15/16   Sherrie George, PA-C  Acetylcysteine (NAC) 600 MG CAPS Take 600 mg by mouth in the morning and at bedtime.    [provider]  atenolol (TENORMIN) 25 MG tablet Take 3 tablets (75 mg total) by mouth at bedtime. 01/31/22   Barrett, Joline Salt, PA-C  ATROVENT HFA 17 MCG/ACT inhaler Inhale 1 puff into the lungs every 6 (six) hours as needed for wheezing. 08/23/21   [provider]  b complex vitamins capsule Take 1 capsule by mouth daily.    [provider]  cetirizine (ZYRTEC) 10 MG tablet Take 10 mg by mouth 2 (two) times daily.    [provider]  clonazePAM (KLONOPIN) 0.5 MG tablet Take 0.5 mg by mouth daily.    [provider]  cyclobenzaprine (FLEXERIL) 10 MG tablet Take 10 mg by mouth 3 (three) times daily as needed for muscle  spasms. 07/07/21   [provider]  Digestive Enzymes CAPS Take 1 capsule by mouth 3 (three) times daily with meals.    [provider]  diltiazem (CARDIZEM) 30 MG tablet Take 1 tablet (30 mg total) by mouth daily as needed (palpitations). 12/14/21   Tereso Newcomer T, PA-C  famotidine (PEPCID) 20 MG tablet Take 20 mg by mouth 2 (two) times daily.    [provider]  ferrous sulfate 325 (65 FE) MG tablet Take 325 mg by mouth 3 (three) times a week.    [provider]  fluticasone (FLONASE) 50 MCG/ACT nasal spray Place 1 spray into both nostrils daily as needed for allergies or rhinitis.    [provider]  ibuprofen (ADVIL,MOTRIN) 200 MG tablet You can take 2-3 tablets every 6 hours as needed for pain. Patient taking  differently: Take 200 mg by mouth every 6 (six) hours as needed for mild pain. 12/15/16   Sherrie George, PA-C  levothyroxine (SYNTHROID) 125 MCG tablet Take 1 tablet (125 mcg total) by mouth daily before breakfast. 01/31/22   Barrett, Joline Salt, PA-C  linaclotide (LINZESS) 145 MCG CAPS capsule Take 145 mcg by mouth daily before breakfast.    [provider]  Magnesium 400 MG CAPS Take 1 capsule by mouth daily.    [provider]  magnesium chloride (SLOW-MAG) 64 MG TBEC SR tablet Take 1 tablet (64 mg total) by mouth daily. 10/17/21   Dione Booze, MD  Misc Natural Products (BLOOD SUGAR BALANCE PO) Take 2 capsules by mouth daily.    [provider]  norethindrone (MICRONOR) 0.35 MG tablet Take 1 tablet by mouth daily.    [provider]  Omega-3 Fatty Acids (FISH OIL) 1000 MG CAPS Take 1,000 mg by mouth in the morning and at bedtime.    [provider]  OVER THE COUNTER MEDICATION Take 2 tablets by mouth daily. Medication: Beet Root Supplement    [provider]  OVER THE COUNTER MEDICATION Take 1 Capful by mouth daily. Medication: Lung Health    [provider]  potassium chloride SA (KLOR-CON M) 20 MEQ tablet Take 1 tablet (20 mEq total) by mouth daily. 12/14/21   Tereso Newcomer T, PA-C  Probiotic Product (PROBIOTIC DAILY PO) Take 2 Capfuls by mouth daily.    [provider]  Zinc Oxide-Vitamin C (ZINC PLUS VITAMIN C PO) Take 1 tablet by mouth daily. With Vit D    [provider]      Allergies    Metoprolol, Cipro [ciprofloxacin hcl], and Prednisone    Review of Systems   Review of Systems  Constitutional:  Positive for fatigue. Negative for chills, diaphoresis and fever.  HENT:  Negative for congestion, rhinorrhea and sneezing.   Eyes: Negative.   Respiratory:  Positive for shortness of breath. Negative for cough and chest tightness.   Cardiovascular:  Positive for chest pain and palpitations. Negative for leg  swelling.  Gastrointestinal:  Negative for abdominal pain, blood in stool, diarrhea, nausea and vomiting.  Genitourinary:  Negative for difficulty urinating, flank pain, frequency and hematuria.  Musculoskeletal:  Positive for neck pain. Negative for arthralgias and back pain.  Skin:  Negative for rash.  Neurological:  Positive for numbness and headaches. Negative for dizziness, speech difficulty and weakness.    Physical Exam Updated Vital Signs BP 117/69   Pulse 79   Temp 98.8 F (37.1 C) (Oral)   Resp 16   Ht 5\' 9"  (1.753 m)  Wt 108.9 kg   LMP 02/03/2022 (Approximate)   SpO2 99%   BMI 35.44 kg/m  Physical Exam Constitutional:      Appearance: She is well-developed.  HENT:     Head: Normocephalic and atraumatic.  Eyes:     Pupils: Pupils are equal, round, and reactive to light.  Neck:     Comments: No meningismus, she does have a little bit of palpable tenderness to her left paracervical area Cardiovascular:     Rate and Rhythm: Normal rate and regular rhythm.     Heart sounds: Normal heart sounds.     Comments: No carotid bruit Pulmonary:     Effort: Pulmonary effort is normal. No respiratory distress.     Breath sounds: Normal breath sounds. No wheezing or rales.  Chest:     Chest wall: No tenderness.  Abdominal:     General: Bowel sounds are normal.     Palpations: Abdomen is soft.     Tenderness: There is no abdominal tenderness. There is no guarding or rebound.  Musculoskeletal:        General: Normal range of motion.     Cervical back: Normal range of motion and neck supple.  Lymphadenopathy:     Cervical: No cervical adenopathy.  Skin:    General: Skin is warm and dry.     Findings: No rash.  Neurological:     Mental Status: She is alert and oriented to person, place, and time.     Comments: Motor 5 out of 5 all extremities, sensation is slightly diminished in the left arm and the left leg as compared to the right, no pronator drift, cranial nerves II  through XII grossly intact     ED Results / Procedures / Treatments   Labs (all labs ordered are listed, but only abnormal results are displayed) Labs Reviewed  BASIC METABOLIC PANEL - Abnormal; Notable for the following components:      Result Value   Glucose, Bld 122 (*)    All other components within normal limits  CBC  PREGNANCY, URINE  TROPONIN I (HIGH SENSITIVITY)    EKG EKG Interpretation  Date/Time:  Tuesday February 28 2022 19:46:55 EDT Ventricular Rate:  89 PR Interval:  144 QRS Duration: 84 QT Interval:  366 QTC Calculation: 445 R Axis:   71 Text Interpretation: Normal sinus rhythm Normal ECG When compared with ECG of 11-Feb-2022 11:38, PREVIOUS ECG IS PRESENT since last tracing no significant change Confirmed by Rolan Bucco [phone]) on 02/28/2022 9:54:45 PM  Radiology DG Chest 2 View  Result Date: 02/28/2022 CLINICAL DATA:  Left-sided chest pain with headache, fatigue and dizziness. EXAM: CHEST - 2 VIEW COMPARISON:  Radiographs 02/07/2022 and 01/30/2022.  CT 01/30/2022. FINDINGS: The heart size and mediastinal contours are normal. The lungs are clear. There is no pleural effusion or pneumothorax. No acute osseous findings are identified. IMPRESSION: Stable chest.  No evidence of active cardiopulmonary process. Electronically Signed   By: Carey Bullocks M.D.   On: 02/28/2022 20:09    Procedures Procedures    Medications Ordered in ED Medications - No data to display  ED Course/ Medical Decision Making/ A&P                           Medical Decision Making Amount and/or Complexity of Data Reviewed Labs: ordered. Radiology: ordered.   Patient presents with palpitations and some intermittent chest pain.  Her EKG shows a sinus rhythm with  no ectopy or arrhythmias.  This was interpreted by me.  She had a troponin that was negative.  Given the symptoms, I did not feel the repeat was indicated.  Her other labs are nonconcerning.  She also has a left-sided  headache and some left-sided neck pain with some left-sided dizziness.  She has had the left side numbness for several months intermittently and is unchanged today.  She had an MRI with the symptoms that was negative for stroke.  She has not had a headache she said in the past although I do see that was documented in July 8 that she had a left-sided headache.  She says this feels different than headache she has had before.  I discussed with her doing a CTA to look at her vessels and vessels in her neck given that she has dizziness when she turns her head to the side.  She is concerned about the amount of CT scans that she had.  I am also concerned.  I did give her the risk of ongoing radiation versus the risk of not doing the scans and potentially missing important pathology.  We had a long discussion about this.  A lot of her symptoms are more chronic in nature.  Headache is different than her normal headache.  At this point she wants to hold off on any further imaging.  She will contact her doctor tomorrow to discuss alternatives.  She does not request anything for the headache.  She says that she feels better knowing that her heart checked out okay and there is no arrhythmias.  She does not feel the need to get any medication for her migraine.  She was discharged home in good condition.  She will have close follow-up with her primary care doctor.  Return precautions were given.  Final Clinical Impression(s) / ED Diagnoses Final diagnoses:  Palpitations  Acute nonintractable headache, unspecified headache type    Rx / DC Orders ED Discharge Orders     None         Rolan Bucco, MD 02/28/22 2242

## 2022-03-01 ENCOUNTER — Telehealth: Payer: Self-pay

## 2022-03-01 ENCOUNTER — Other Ambulatory Visit: Payer: Self-pay | Admitting: *Deleted

## 2022-03-01 ENCOUNTER — Other Ambulatory Visit: Payer: Self-pay

## 2022-03-01 ENCOUNTER — Encounter: Payer: Self-pay | Admitting: Cardiology

## 2022-03-01 DIAGNOSIS — I471 Supraventricular tachycardia: Secondary | ICD-10-CM

## 2022-03-01 DIAGNOSIS — R519 Headache, unspecified: Secondary | ICD-10-CM

## 2022-03-01 NOTE — Telephone Encounter (Signed)
Called pt and went over ablation instructions. She is aware of the date/time and all understood all instructions. Letter sent via MyChart.

## 2022-03-02 NOTE — Telephone Encounter (Signed)
Work up complete. 

## 2022-03-07 ENCOUNTER — Ambulatory Visit: Payer: 59 | Admitting: Internal Medicine

## 2022-03-19 ENCOUNTER — Ambulatory Visit (HOSPITAL_BASED_OUTPATIENT_CLINIC_OR_DEPARTMENT_OTHER): Payer: 59 | Attending: General Practice | Admitting: Cardiovascular Disease

## 2022-03-19 DIAGNOSIS — G4730 Sleep apnea, unspecified: Secondary | ICD-10-CM | POA: Insufficient documentation

## 2022-03-19 DIAGNOSIS — I493 Ventricular premature depolarization: Secondary | ICD-10-CM | POA: Diagnosis not present

## 2022-03-19 DIAGNOSIS — R400 Somnolence: Secondary | ICD-10-CM

## 2022-03-19 DIAGNOSIS — R5383 Other fatigue: Secondary | ICD-10-CM | POA: Diagnosis not present

## 2022-03-19 DIAGNOSIS — R0683 Snoring: Secondary | ICD-10-CM | POA: Diagnosis not present

## 2022-03-19 DIAGNOSIS — G4736 Sleep related hypoventilation in conditions classified elsewhere: Secondary | ICD-10-CM | POA: Diagnosis not present

## 2022-03-23 ENCOUNTER — Other Ambulatory Visit: Payer: 59

## 2022-03-23 DIAGNOSIS — I471 Supraventricular tachycardia: Secondary | ICD-10-CM

## 2022-03-24 LAB — CBC WITH DIFFERENTIAL/PLATELET
Basophils Absolute: 0 10*3/uL (ref 0.0–0.2)
Basos: 0 %
EOS (ABSOLUTE): 0.2 10*3/uL (ref 0.0–0.4)
Eos: 3 %
Hematocrit: 40 % (ref 34.0–46.6)
Hemoglobin: 13.8 g/dL (ref 11.1–15.9)
Immature Grans (Abs): 0 10*3/uL (ref 0.0–0.1)
Immature Granulocytes: 0 %
Lymphocytes Absolute: 2.2 10*3/uL (ref 0.7–3.1)
Lymphs: 30 %
MCH: 28.7 pg (ref 26.6–33.0)
MCHC: 34.5 g/dL (ref 31.5–35.7)
MCV: 83 fL (ref 79–97)
Monocytes Absolute: 0.7 10*3/uL (ref 0.1–0.9)
Monocytes: 9 %
Neutrophils Absolute: 4.2 10*3/uL (ref 1.4–7.0)
Neutrophils: 58 %
Platelets: 285 10*3/uL (ref 150–450)
RBC: 4.81 x10E6/uL (ref 3.77–5.28)
RDW: 13.2 % (ref 11.7–15.4)
WBC: 7.4 10*3/uL (ref 3.4–10.8)

## 2022-03-24 LAB — BASIC METABOLIC PANEL
BUN/Creatinine Ratio: 8 — ABNORMAL LOW (ref 9–23)
BUN: 8 mg/dL (ref 6–20)
CO2: 24 mmol/L (ref 20–29)
Calcium: 10 mg/dL (ref 8.7–10.2)
Chloride: 100 mmol/L (ref 96–106)
Creatinine, Ser: 0.97 mg/dL (ref 0.57–1.00)
Glucose: 92 mg/dL (ref 70–99)
Potassium: 4.4 mmol/L (ref 3.5–5.2)
Sodium: 137 mmol/L (ref 134–144)
eGFR: 77 mL/min/{1.73_m2} (ref >59)

## 2022-03-27 ENCOUNTER — Encounter (HOSPITAL_BASED_OUTPATIENT_CLINIC_OR_DEPARTMENT_OTHER): Payer: Self-pay | Admitting: Cardiovascular Disease

## 2022-03-27 NOTE — Procedures (Signed)
Patient Name: Sheri Johnson, Bohman Date: 03/19/2022 Gender: Female D.O.B: 1985-09-23 Age (years): 36 Referring Provider: Edd Fabian NP Height (inches): 69 Interpreting Physician: Nicki Guadalajara MD, ABSM Weight (lbs): 240 RPSGT: [place], [place] BMI: 35 MRN: [id number] Neck Size: <br>  CLINICAL INFORMATION Sleep Study Type: NPSG  Indication for sleep study: Excessive Daytime Sleepiness, Fatigue, Morning Headaches, Snoring, Witnessed Apneas  Epworth Sleepiness Score: 8  SLEEP STUDY TECHNIQUE As per the AASM Manual for the Scoring of Sleep and Associated Events v2.3 (April 2016) with a hypopnea requiring 4% desaturations.  The channels recorded and monitored were frontal, central and occipital EEG, electrooculogram (EOG), submentalis EMG (chin), nasal and oral airflow, thoracic and abdominal wall motion, anterior tibialis EMG, snore microphone, electrocardiogram, and pulse oximetry.  MEDICATIONS acetaminophen (TYLENOL) 325 MG tablet Acetylcysteine (NAC) 600 MG CAPS atenolol (TENORMIN) 25 MG tablet ATROVENT HFA 17 MCG/ACT inhaler b complex vitamins capsule cetirizine (ZYRTEC) 10 MG tablet clonazePAM (KLONOPIN) 0.5 MG tablet cyclobenzaprine (FLEXERIL) 10 MG tablet Digestive Enzymes CAPS diltiazem (CARDIZEM) 30 MG tablet famotidine (PEPCID) 20 MG tablet ferrous sulfate 325 (65 FE) MG tablet fluticasone (FLONASE) 50 MCG/ACT nasal spray ibuprofen (ADVIL,MOTRIN) 200 MG tablet levothyroxine (SYNTHROID) 125 MCG tablet linaclotide (LINZESS) 145 MCG CAPS capsule Magnesium 400 MG CAPS magnesium chloride (SLOW-MAG) 64 MG TBEC SR tablet Misc Natural Products (BLOOD SUGAR BALANCE PO) norethindrone (MICRONOR) 0.35 MG tablet Omega-3 Fatty Acids (FISH OIL) 1000 MG CAPS OVER THE COUNTER MEDICATION OVER THE COUNTER MEDICATION potassium chloride SA (KLOR-CON M) 20 MEQ tablet Probiotic Product (PROBIOTIC DAILY PO) Zinc Oxide-Vitamin C (ZINC PLUS VITAMIN C PO) Medications  self-administered by patient taken the night of the study : N/A  SLEEP ARCHITECTURE The study was initiated at 9:42:59 PM and ended at 4:42:18 AM.  Sleep onset time was 15.1 minutes and the sleep efficiency was 94.5%%. The total sleep time was 396.2 minutes.  Stage REM latency was 134.5 minutes.  The patient spent 6.6%% of the night in stage N1 sleep, 75.2%% in stage N2 sleep, 6.8%% in stage N3 and 11.4% in REM.  Alpha intrusion was absent.  Supine sleep was 69.28%.  RESPIRATORY PARAMETERS The overall apnea/hypopnea index (AHI) was 2.3 per hour. The respiratory disturbance index (RDI) was 4.2/h.  There were 1 total apneas, including 1 obstructive, 0 central and 0 mixed apneas. There were 14 hypopneas and 13 RERAs.  The AHI during Stage REM sleep was 18.7 per hour.  AHI while supine was 3.3 per hour.  The mean oxygen saturation was 93.4%. The minimum SpO2 during sleep was 76.0% during the last REM sleep period.  Soft snoring was noted during this study.  CARDIAC DATA The 2 lead EKG demonstrated sinus rhythm. The mean heart rate was 75.3 beats per minute. Other EKG findings include: PVCs.  LEG MOVEMENT DATA The total PLMS were 0 with a resulting PLMS index of 0.0. Associated arousal with leg movement index was 3.5 .  IMPRESSIONS - No significant obstructive sleep apnea overall (AHI 2.3/h; RDI 4.2/h); however, there was moderate sleep apnea during REM sleep ( AHI 18.7/h). - Significant oxygen desaturation to a nadir of 76% with REM sleep. - The patient snored with soft snoring volume. - EKG findings include PVCs. - Clinically significant periodic limb movements did not occur during sleep. No significant associated arousals.  DIAGNOSIS - Sleep apnea, unspecified (G47.30) - Nocturnal Hypoxemia (G47.36)  RECOMMENDATIONS - At present patient does not meet criteria for CPAP.  - Effort should be made to optimize nasal and  oropharyngeal patency. - Recommend overnight oximetry  evaluation with O2 nadir at 76% during the last REM sleep.  - Consider sleep study re-evaluation in future if symptoms progress. - Avoid alcohol, sedatives and other CNS depressants that may worsen sleep apnea and disrupt normal sleep architecture. - Sleep hygiene should be reviewed to assess factors that may improve sleep quality. - Weight management (BMI 35) and regular exercise should be initiated or continued if appropriate.  [Electronically signed] 03/27/2022 10:21 AM  Nicki Guadalajara MD, [facility], ABSM Diplomate, American Board of Sleep Medicine  NPI: [id number]  [facility] PH: [phone]   FX: [phone] ACCREDITED BY THE AMERICAN ACADEMY OF SLEEP MEDICINE

## 2022-03-30 ENCOUNTER — Telehealth: Payer: Self-pay | Admitting: Cardiology

## 2022-03-30 NOTE — Telephone Encounter (Signed)
Called patient, advised of message from RN.  Patient verbalized understanding.  She is also questioning about sleep study results- advised I would route to sleep team to review.  Thanks!

## 2022-03-30 NOTE — Telephone Encounter (Signed)
Patient c/o Palpitations:  High priority if patient c/o lightheadedness, shortness of breath, or chest pain  How long have you had palpitations/irregular HR/ Afib? Palpitations started this morning. Are you having the symptoms now? Yes having palpitations now  Are you currently experiencing lightheadedness, SOB or CP? no  Do you have a history of afib (atrial fibrillation) or irregular heart rhythm? Has history of palpitations  Have you checked your BP or HR? (document readings if available): no  Are you experiencing any other symptoms? No. Patient wants to know if she can take her diltiazem for today as she has things she has to get done prior to her ablation.

## 2022-03-30 NOTE — Pre-Procedure Instructions (Signed)
Instructed patient on the following items: Arrival time 0930 Nothing to eat or drink after midnight No meds AM of procedure Responsible person to drive you home and stay with you for 24 hrs      

## 2022-03-31 ENCOUNTER — Encounter (HOSPITAL_COMMUNITY): Payer: Self-pay | Admitting: Internal Medicine

## 2022-03-31 ENCOUNTER — Other Ambulatory Visit: Payer: Self-pay

## 2022-03-31 ENCOUNTER — Ambulatory Visit (HOSPITAL_COMMUNITY)
Admission: RE | Disposition: A | Discharge status: Home / Self Care | Payer: Self-pay | Source: Home / Self Care | Attending: Internal Medicine

## 2022-03-31 ENCOUNTER — Ambulatory Visit (HOSPITAL_COMMUNITY)
Admission: RE | Admit: 2022-03-31 | Discharge: 2022-04-01 | Disposition: A | Discharge status: Home / Self Care | Payer: 59 | Attending: Internal Medicine | Admitting: Internal Medicine

## 2022-03-31 DIAGNOSIS — Z6835 Body mass index (BMI) 35.0-35.9, adult: Secondary | ICD-10-CM | POA: Diagnosis not present

## 2022-03-31 DIAGNOSIS — E669 Obesity, unspecified: Secondary | ICD-10-CM | POA: Insufficient documentation

## 2022-03-31 DIAGNOSIS — I10 Essential (primary) hypertension: Secondary | ICD-10-CM | POA: Diagnosis not present

## 2022-03-31 DIAGNOSIS — I471 Supraventricular tachycardia: Secondary | ICD-10-CM | POA: Insufficient documentation

## 2022-03-31 HISTORY — PX: SVT ABLATION: EP1225

## 2022-03-31 LAB — PREGNANCY, URINE: Preg Test, Ur: NEGATIVE

## 2022-03-31 SURGERY — SVT ABLATION

## 2022-03-31 MED ORDER — HEPARIN SODIUM (PORCINE) 1000 UNIT/ML IJ SOLN
INTRAMUSCULAR | Status: AC
  Filled 2022-03-31: qty 10

## 2022-03-31 MED ORDER — HEPARIN SODIUM (PORCINE) 1000 UNIT/ML IJ SOLN
INTRAMUSCULAR | Status: AC
Start: 2022-03-31 — End: ?
  Filled 2022-03-31: qty 10

## 2022-03-31 MED ORDER — FENTANYL CITRATE (PF) 100 MCG/2ML IJ SOLN
INTRAMUSCULAR | Status: AC
  Filled 2022-03-31: qty 2

## 2022-03-31 MED ORDER — MIDAZOLAM HCL 5 MG/5ML IJ SOLN
INTRAMUSCULAR | Status: AC
  Filled 2022-03-31: qty 5

## 2022-03-31 MED ORDER — SODIUM CHLORIDE 0.9% FLUSH: 3.0000 mL | INTRAVENOUS | Status: DC | PRN

## 2022-03-31 MED ORDER — SODIUM CHLORIDE 0.9 % IV SOLN: 250.0000 mL | INTRAVENOUS | Status: DC | PRN

## 2022-03-31 MED ORDER — IBUPROFEN 600 MG PO TABS
600.0000 mg | ORAL_TABLET | Freq: Once | ORAL | Status: AC
  Administered 2022-03-31: 600 mg via ORAL
  Filled 2022-03-31: qty 1

## 2022-03-31 MED ORDER — LUBIPROSTONE 24 MCG PO CAPS
24.0000 ug | ORAL_CAPSULE | Freq: Two times a day (BID) | ORAL | Status: DC
  Administered 2022-03-31: 24 ug via ORAL
  Filled 2022-03-31 (×2): qty 1

## 2022-03-31 MED ORDER — ATENOLOL 50 MG PO TABS
75.0000 mg | ORAL_TABLET | Freq: Every day | ORAL | Status: DC
  Administered 2022-03-31: 75 mg via ORAL
  Filled 2022-03-31: qty 1

## 2022-03-31 MED ORDER — ONDANSETRON HCL 4 MG/2ML IJ SOLN: 4.0000 mg | Freq: Four times a day (QID) | INTRAMUSCULAR | Status: DC | PRN

## 2022-03-31 MED ORDER — FENTANYL CITRATE (PF) 100 MCG/2ML IJ SOLN
INTRAMUSCULAR | Status: DC | PRN
Start: 2022-03-31 — End: 2022-03-31
  Administered 2022-03-31 (×2): 12.5 ug via INTRAVENOUS
  Administered 2022-03-31: 25 ug via INTRAVENOUS
  Administered 2022-03-31 (×4): 12.5 ug via INTRAVENOUS

## 2022-03-31 MED ORDER — BUDESONIDE 0.25 MG/2ML IN SUSP
0.2500 mg | Freq: Two times a day (BID) | RESPIRATORY_TRACT | Status: DC
  Administered 2022-04-01: 0.25 mg via RESPIRATORY_TRACT
  Filled 2022-03-31: qty 2

## 2022-03-31 MED ORDER — CLONAZEPAM 0.5 MG PO TABS
0.5000 mg | ORAL_TABLET | Freq: Two times a day (BID) | ORAL | Status: DC | PRN
  Administered 2022-03-31: 0.5 mg via ORAL
  Filled 2022-03-31: qty 1

## 2022-03-31 MED ORDER — SODIUM CHLORIDE 0.9 % IV SOLN: INTRAVENOUS | Status: DC

## 2022-03-31 MED ORDER — BUPIVACAINE HCL (PF) 0.25 % IJ SOLN
INTRAMUSCULAR | Status: DC | PRN
  Administered 2022-03-31: 60 mL

## 2022-03-31 MED ORDER — SODIUM CHLORIDE 0.9% FLUSH
3.0000 mL | Freq: Two times a day (BID) | INTRAVENOUS | Status: DC
  Administered 2022-03-31: 3 mL via INTRAVENOUS

## 2022-03-31 MED ORDER — ACETAMINOPHEN 325 MG PO TABS
650.0000 mg | ORAL_TABLET | ORAL | Status: DC | PRN
  Administered 2022-03-31 (×2): 650 mg via ORAL
  Filled 2022-03-31 (×2): qty 2

## 2022-03-31 MED ORDER — LORATADINE 10 MG PO TABS
10.0000 mg | ORAL_TABLET | Freq: Every day | ORAL | Status: DC
  Filled 2022-03-31: qty 1

## 2022-03-31 MED ORDER — PANTOPRAZOLE SODIUM 40 MG PO TBEC
40.0000 mg | DELAYED_RELEASE_TABLET | Freq: Two times a day (BID) | ORAL | Status: DC
  Administered 2022-03-31: 40 mg via ORAL
  Filled 2022-03-31: qty 1

## 2022-03-31 MED ORDER — HEPARIN (PORCINE) IN NACL 1000-0.9 UT/500ML-% IV SOLN
INTRAVENOUS | Status: DC | PRN
  Administered 2022-03-31: 500 mL

## 2022-03-31 MED ORDER — MIDAZOLAM HCL 5 MG/5ML IJ SOLN
INTRAMUSCULAR | Status: DC | PRN
  Administered 2022-03-31 (×4): 1 mg via INTRAVENOUS
  Administered 2022-03-31: 2 mg via INTRAVENOUS
  Administered 2022-03-31 (×2): 1 mg via INTRAVENOUS

## 2022-03-31 SURGICAL SUPPLY — 12 items
BAG SNAP BAND KOVER 36X36 (MISCELLANEOUS) IMPLANT
CATH EZ STEER NAV 4MM D-F CUR (ABLATOR) IMPLANT
CATH JOSEPH QUAD ALLRED 6F REP (CATHETERS) IMPLANT
CATH POLARIS X REPROCESSED (CATHETERS) IMPLANT
MAT PREVALON FULL STRYKER (MISCELLANEOUS) IMPLANT
PACK EP LATEX FREE (CUSTOM PROCEDURE TRAY) ×1
PACK EP LF (CUSTOM PROCEDURE TRAY) ×1 IMPLANT
PAD DEFIB RADIO PHYSIO CONN (PAD) ×1 IMPLANT
PATCH CARTO3 (PAD) IMPLANT
SHEATH PINNACLE 6F 10CM (SHEATH) IMPLANT
SHEATH PINNACLE 7F 10CM (SHEATH) IMPLANT
SHEATH PINNACLE 8F 10CM (SHEATH) IMPLANT

## 2022-03-31 NOTE — Plan of Care (Signed)

## 2022-03-31 NOTE — H&P (Signed)
HPI [name] is referred by Dr. Marsa Johnson for evaluation of SVT. The patient is a pleasant 36 yo woman with a h/o SVT which has responded to IV adenosine in the past. She also has a h/o HTN and has been treated in the past with atenolol. She saw Dr. Marsa Johnson in April. She has worn a cardiac monitor which was unrevealing. She has had documented SVT at 230/min.      Allergies  Allergen Reactions   Cipro [Ciprofloxacin Hcl] Palpitations   Prednisone Palpitations              Current Outpatient Medications  Medication Sig Dispense Refill   acetaminophen (TYLENOL) 325 MG tablet You can take 2 tablets every 6 hours for pain.  Your prescribed pain medicine also has Tylenol(acetaminophen) in it.  Count each one of those as a Tylenol tablet.  Do not take more than 4000 mg of Tylenol(acetaminophen) per day. 100 tablet 2   ALPRAZolam (XANAX) 0.5 MG tablet Take 0.5 mg by mouth daily as needed.       amoxicillin-clavulanate (AUGMENTIN) 875-125 MG tablet Take 1 tablet by mouth every 12 (twelve) hours. 14 tablet 0   atenolol (TENORMIN) 25 MG tablet Take 1 tablet by mouth in the morning, take 2 tablets by mouth in the evening (12 hours apart) 270 tablet 1   ATROVENT HFA 17 MCG/ACT inhaler Inhale 1 puff into the lungs as needed.       busPIRone (BUSPAR) 15 MG tablet Take 15 mg by mouth daily.       cetirizine (ZYRTEC) 10 MG tablet Take 10 mg by mouth daily.       cyclobenzaprine (FLEXERIL) 10 MG tablet Take 10 mg by mouth as needed.       diltiazem (CARDIZEM) 30 MG tablet Take 1 tablet (30 mg total) by mouth daily as needed (palpitations). 30 tablet 1   ibuprofen (ADVIL,MOTRIN) 200 MG tablet You can take 2-3 tablets every 6 hours as needed for pain.       levothyroxine (SYNTHROID) 88 MCG tablet Take 88 mcg by mouth daily.       LINZESS 72 MCG capsule Take 72 mcg by mouth as needed (irritable bowel symptoms).       magnesium chloride (SLOW-MAG) 64 MG TBEC SR tablet Take 1 tablet (64 mg total) by mouth  daily. 30 tablet 0   potassium chloride SA (KLOR-CON M) 20 MEQ tablet Take 1 tablet (20 mEq total) by mouth daily. 30 tablet 6    No current facility-administered medications for this visit.            Past Medical History:  Diagnosis Date   Abortion 11/14/2021   Anxiety     Asthma     Hypertension     Hypothyroid     SVT (supraventricular tachycardia) (HCC)        ROS:    All systems reviewed and negative except as noted in the HPI.          Past Surgical History:  Procedure Laterality Date   LAPAROSCOPIC APPENDECTOMY N/A 12/14/2016    Procedure: APPENDECTOMY LAPAROSCOPIC;  Surgeon: Sheri Miner, MD;  Location: [facility];  Service: General;  Laterality: N/A;             Family History  Problem Relation Age of Onset   Hypothyroidism Mother          Social History  Socioeconomic History   Marital status: Single      Spouse name: Not on file   Number of children: 3   Years of education: Not on file   Highest education level: Not on file  Occupational History   Not on file  Tobacco Use   Smoking status: Never   Smokeless tobacco: Never  Substance and Sexual Activity   Alcohol use: Yes      Comment: Rare   Drug use: Not on file   Sexual activity: Not on file  Other Topics Concern   Not on file  Social History Narrative   Not on file    Social Determinants of Health    Financial Resource Strain: Not on file  Food Insecurity: Not on file  Transportation Needs: Not on file  Physical Activity: Not on file  Stress: Not on file  Social Connections: Not on file  Intimate Partner Violence: Not on file        There were no vitals taken for this visit.   Physical Exam:   Well appearing NAD HEENT: Unremarkable Neck:  No JVD, no thyromegally Lymphatics:  No adenopathy Back:  No CVA tenderness Lungs:  Clear with no wheezes HEART:  Regular rate rhythm, no murmurs, no rubs, no clicks Abd:  soft, positive bowel sounds, no organomegally, no rebound,  no guarding Ext:  2 plus pulses, no edema, no cyanosis, no clubbing Skin:  No rashes no nodules Neuro:  CN II through XII intact, motor grossly intact   EKG - nsr with no pre-excitation   DEVICE  Normal device function.  See PaceArt for details.    Assess/Plan:  Recurrent SVT - I have discussed the treatment options with the patient and recommend proceeding with EP study and ablation. She will call us if she would like to proceed. Obesity - after her ablation she will need to work on her weight.   Sheri Gowda Kiasha Bellin,MD

## 2022-03-31 NOTE — Progress Notes (Signed)
Site area: right groin  Site Prior to Removal:  Level 0  Pressure Applied For 15 MINUTES    Minutes Beginning at 1520  Manual:   Yes.    Patient Status During Pull:  Stable  Post Pull Groin Site:  Level 0  Post Pull Instructions Given:  Yes.    Post Pull Pulses Present:  Yes.    Dressing Applied:  Yes.    Comments:  Bed rest started at 1535 X 6 hr.

## 2022-03-31 NOTE — Progress Notes (Signed)
Patient requests home meds and pain medicine, Provider notified.

## 2022-04-01 DIAGNOSIS — I471 Supraventricular tachycardia: Secondary | ICD-10-CM | POA: Diagnosis not present

## 2022-04-01 NOTE — Progress Notes (Signed)
Rounding Note    Patient Name: Sheri Johnson Date of Encounter: 04/01/2022  [facility] Cardiologist: Sheri Millers, MD   Subjective   NAEO.   Inpatient Medications    Scheduled Meds:  atenolol  75 mg Oral QHS   budesonide  0.25 mg Nebulization BID   loratadine  10 mg Oral Daily   lubiprostone  24 mcg Oral BID   pantoprazole  40 mg Oral BID   sodium chloride flush  3 mL Intravenous Q12H   Continuous Infusions:  sodium chloride     PRN Meds: sodium chloride, acetaminophen, clonazePAM, ondansetron (ZOFRAN) IV, sodium chloride flush   Vital Signs    Vitals:   04/01/22 0000 04/01/22 0524 04/01/22 0816 04/01/22 0824  BP: 109/61 (!) 109/50    Pulse: 85 71    Resp: 16 14    Temp: 98.6 F (37 C) 98.5 F (36.9 C) 99.5 F (37.5 C)   TempSrc: Oral Oral Oral   SpO2: 99% 98%  100%  Weight:  109.3 kg    Height:        Intake/Output Summary (Last 24 hours) at 04/01/2022 0841 Last data filed at 03/31/2022 2149 Gross per 24 hour  Intake 620 ml  Output --  Net 620 ml      04/01/2022    5:24 AM 03/31/2022    5:05 PM 03/31/2022    9:56 AM  Last 3 Weights  Weight (lbs) 240 lb 14.4 oz 239 lb 10.2 oz 238 lb  Weight (kg) 109.272 kg 108.7 kg 107.956 kg      Telemetry    sinus - Personally Reviewed  ECG    Personally Reviewed  Physical Exam   GEN: No acute distress.   Neck: No JVD Cardiac: RRR, no murmurs, rubs, or gallops. RFV access site without hematoma or pain Respiratory: Clear to auscultation bilaterally. GI: Soft, nontender, non-distended  MS: No edema; No deformity. Neuro:  Nonfocal  Psych: Normal affect   Labs    High Sensitivity Troponin:  No results for input(s): "TROPONINIHS" in the last 720 hours.   ChemistryNo results for input(s): "NA", "K", "CL", "CO2", "GLUCOSE", "BUN", "CREATININE", "CALCIUM", "MG", "PROT", "ALBUMIN", "AST", "ALT", "ALKPHOS", "BILITOT", "GFRNONAA", "GFRAA", "ANIONGAP" in the last 168 hours.  Lipids No results  for input(s): "CHOL", "TRIG", "HDL", "LABVLDL", "LDLCALC", "CHOLHDL" in the last 168 hours.  HematologyNo results for input(s): "WBC", "RBC", "HGB", "HCT", "MCV", "MCH", "MCHC", "RDW", "PLT" in the last 168 hours. Thyroid No results for input(s): "TSH", "FREET4" in the last 168 hours.  BNPNo results for input(s): "BNP", "PROBNP" in the last 168 hours.  DDimer No results for input(s): "DDIMER" in the last 168 hours.   Radiology    EP STUDY  Result Date: 03/31/2022 CONCLUSIONS: 1. Sinus rhythm upon presentation. 2. 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 Successful radiofrequency modification of the slow AV nodal pathway 4. No inducible arrhythmias following ablation. 5. No early apparent complications. 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 Ok to stop diltiazem  OK to discharge.  For questions or updates, please contact [facility] Please consult [email or web address] for contact info under        Signed, Sheri Prude, MD  04/01/2022, 8:41 AM

## 2022-04-01 NOTE — Discharge Summary (Signed)
Discharge Summary    Patient ID: Sheri Johnson MRN: [id number]; DOB: [birth year]  Admit date: 03/31/2022 Discharge date: 04/01/2022  PCP:  [facility]   [facility] Providers Cardiologist:  Olga Millers, MD  Electrophysiologist:  Lewayne Bunting, MD       Discharge Diagnoses    Principal Problem:   SVT (supraventricular tachycardia) [facility]) Active Problems:   Essential hypertension  Diagnostic Studies/Procedures    SVT Ablation: 03/31/2022 CONCLUSIONS:  1. Sinus rhythm upon presentation.  2. 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 Successful radiofrequency modification of the slow AV nodal pathway  4. No inducible arrhythmias following ablation.  5. No early apparent complications.    History of Present Illness     Sheri Johnson is a 36 y.o. female with past medical history of HTN and SVT who presented to [facility] on 03/31/2022 for planned SVT ablation.   She was evaluated by Dr. Ladona Ridgel as an outpatient and had been noted to have recurrent SVT which would resolve with Adenosine. She had been on Atenolol and Cardizem but was still having break-through episodes, therefore SVT ablation was recommended.   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 Ablation was successful with no immediate complications noted.   The following morning, she reported overall feeling well and was maintaining NSR. Groin sites were stable with no evidence of a hematoma. She was examined by Dr. Lalla Brothers and deemed stable for discharge with plans to stop Cardizem but will continue on Atenolol for BP control. She has previously scheduled follow-up with Dr. Ladona Ridgel in 05/2022 and will  keep that visit.    Did the patient have an acute coronary syndrome (MI, NSTEMI, STEMI, etc) this admission?:  No                               Did the patient have a percutaneous coronary intervention (stent / angioplasty)?:  No.      ___________  Discharge Vitals Blood pressure (!) 109/50, pulse 71, temperature 99.5 F (37.5 C), temperature source Oral, resp. rate 14, height 5\' 9"  (1.753 m), weight 109.3 kg, SpO2 100 %.  Filed Weights   03/31/22 0956 03/31/22 1705 04/01/22 0524  Weight: 108 kg 108.7 kg 109.3 kg    Labs & Radiologic Studies    CBC No results for input(s): "WBC", "NEUTROABS", "HGB", "HCT", "MCV", "PLT" in the last 72 hours. Basic Metabolic Panel No results for input(s): "NA", "K", "CL", "CO2", "GLUCOSE", "BUN", "CREATININE", "CALCIUM", "MG", "PHOS" in the last 72 hours. Liver Function Tests No results for input(s): "AST", "ALT", "ALKPHOS", "BILITOT", "PROT", "ALBUMIN" in the last 72 hours. No results for input(s): "LIPASE", "AMYLASE" in the last 72 hours. High Sensitivity Troponin:   No results for input(s): "TROPONINIHS" in the last 720 hours.  BNP Invalid input(s): "POCBNP" D-Dimer No results for input(s): "DDIMER" in the last 72 hours. Hemoglobin A1C No results for input(s): "HGBA1C" in the last 72 hours. Fasting Lipid Panel No results for input(s): "CHOL", "HDL", "LDLCALC", "TRIG", "CHOLHDL", "LDLDIRECT" in the last 72 hours. Thyroid Function Tests No results for input(s): "TSH", "T4TOTAL", "T3FREE", "THYROIDAB" in the last 72 hours.  Invalid input(s): "FREET3"  _____________  EP STUDY  Result Date: 03/31/2022 CONCLUSIONS: 1. Sinus rhythm upon presentation. 2. 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 Successful radiofrequency modification of the slow AV nodal pathway 4. No inducible arrhythmias following ablation. 5. No early  apparent complications. Lewayne Bunting MD, [facility] [place] [facility] 03/31/2022 2:33 PM     Disposition   Pt is being discharged home today in good condition.  Follow-up Plans & Appointments     Follow-up Information     Marinus Maw, MD Follow up on 05/11/2022.   Specialty: Cardiology Why: Cardiology Follow-up on 05/11/2022 at 3:15 PM. Contact information: [address] [place] [address] [phone]                Discharge Instructions     Discharge instructions   Complete by: As directed    PLEASE REMEMBER TO BRING ALL OF YOUR MEDICATIONS TO EACH OF YOUR FOLLOW-UP OFFICE VISITS.  PLEASE ATTEND ALL SCHEDULED FOLLOW-UP APPOINTMENTS.   Activity: Increase activity slowly as tolerated. You may shower, but no soaking baths (or swimming) for 1 week. No driving for 24 hours. No lifting over 5 lbs for 1 week. No sexual activity for 1 week.   Wound Care: You may wash your site gently with soap and water. Keep site clean and dry. If you notice pain, swelling, bleeding or pus at your cath site, please call [phone].       Discharge Medications   Allergies as of 04/01/2022       Reactions   Metoprolol Other (See Comments)   dizziness   Cipro [ciprofloxacin Hcl] Palpitations   Prednisone Palpitations        Medication List     STOP taking these medications    diltiazem 30 MG tablet Commonly known as: Cardizem       TAKE these medications    albuterol 108 (90 Base) MCG/ACT inhaler Commonly known as: VENTOLIN HFA Inhale 2 puffs into the lungs every 6 (six) hours as needed for wheezing or shortness of breath.   atenolol 25 MG tablet Commonly known as: TENORMIN Take 3 tablets (75 mg total) by mouth at bedtime.   b complex vitamins capsule Take 1 capsule by mouth daily.   cetirizine 10 MG tablet Commonly known as: ZYRTEC Take 10 mg by mouth 2 (two) times daily.   clobetasol 0.05 % external solution Commonly known as: TEMOVATE Apply 1  Application topically at bedtime. Apply to scalp   clonazePAM 0.5 MG tablet Commonly known as: KLONOPIN Take 0.5-1 mg by mouth 2 (two) times daily as needed for anxiety.   CoQ10 100 MG Caps Take 100 mg by mouth daily.   cyclobenzaprine 10 MG tablet Commonly known as: FLEXERIL Take 10 mg by mouth in the morning and at bedtime.   Digestive Enzymes Caps Take 1 capsule by mouth 2 (two) times daily before a meal.   fluticasone 44 MCG/ACT inhaler Commonly known as: FLOVENT HFA Inhale 1 puff into the lungs 2 (two) times daily.   fluticasone 50 MCG/ACT nasal spray Commonly known as: FLONASE Place 1 spray into both nostrils daily as needed for allergies or rhinitis.   ibuprofen 800 MG tablet Commonly known as: ADVIL Take 800 mg by mouth every 8 (eight) hours as needed for moderate pain.   levothyroxine 88 MCG tablet Commonly known as: SYNTHROID Take 88 mcg by mouth daily before breakfast.  lubiprostone 24 MCG capsule Commonly known as: AMITIZA Take 24 mcg by mouth 2 (two) times daily.   Magnesium 400 MG Caps Take 400 mg by mouth daily as needed (Low Magnesium).   NONFORMULARY OR COMPOUNDED ITEM Apply 1 application  topically at bedtime. Triple Acting Rosacea Cream   OVER THE COUNTER MEDICATION Take 2 tablets by mouth daily. Medication: Beet Root Supplement   OVER THE COUNTER MEDICATION Take 2 capsules by mouth at bedtime. Medication: Clear Lungs   pantoprazole 40 MG tablet Commonly known as: PROTONIX Take 40 mg by mouth 2 (two) times daily.   potassium chloride SA 20 MEQ tablet Commonly known as: KLOR-CON M Take 1 tablet (20 mEq total) by mouth daily.   PROBIOTIC DAILY PO Take 2 each by mouth at bedtime.   TURMERIC CURCUMIN PO Take 2 capsules by mouth daily.   vitamin C 1000 MG tablet Take 1,000 mg by mouth daily.         Outstanding Labs/Studies   None  Duration of Discharge Encounter   Greater than 30 minutes including physician  time.  Signed, Ellsworth Lennox, PA-C 04/01/2022, 9:55 AM

## 2022-04-03 ENCOUNTER — Encounter (HOSPITAL_COMMUNITY): Payer: Self-pay | Admitting: Internal Medicine

## 2022-04-03 MED FILL — Heparin Sodium (Porcine) Inj 1000 Unit/ML: INTRAMUSCULAR | Qty: 20 | Status: AC

## 2022-04-04 ENCOUNTER — Telehealth: Payer: Self-pay | Admitting: *Deleted

## 2022-04-04 NOTE — Telephone Encounter (Signed)
-----   Message from Lennette Bihari, MD sent at 03/27/2022 10:26 AM EDT ----- Burna Mortimer, please notify pt of results. With O2 desaturation recommend overnight oximetry evaluation.

## 2022-04-04 NOTE — Telephone Encounter (Signed)
Patient notified of sleep study results and recommendations. ONO will be ordered.

## 2022-04-11 ENCOUNTER — Telehealth: Payer: Self-pay | Admitting: Cardiology

## 2022-04-11 NOTE — Telephone Encounter (Signed)
Pt called stating that she would like a call about what she should do following her sleep study results. She states her results were concerning to her

## 2022-04-14 NOTE — Telephone Encounter (Signed)
Returned a call to the patient and advised her that I have been waiting on a response from Dr Jens Som on if he wants to order the ONO or if he wants Dr Tresa Endo to order this. Patient is concerned because she feels that her breathing is not normal. Informed the patient that I will order the test per Dr Landry Dyke orders. Patient voiced understanding and thanked me for returning a call to her.

## 2022-04-18 ENCOUNTER — Encounter: Payer: Self-pay | Admitting: *Deleted

## 2022-04-19 ENCOUNTER — Inpatient Hospital Stay (HOSPITAL_COMMUNITY): Admission: RE | Admit: 2022-04-19 | Payer: 59 | Source: Ambulatory Visit

## 2022-04-19 ENCOUNTER — Telehealth: Payer: Self-pay | Admitting: Psychiatry

## 2022-04-19 ENCOUNTER — Ambulatory Visit (INDEPENDENT_AMBULATORY_CARE_PROVIDER_SITE_OTHER): Payer: 59 | Admitting: Psychiatry

## 2022-04-19 ENCOUNTER — Encounter: Payer: Self-pay | Admitting: Psychiatry

## 2022-04-19 VITALS — BP 129/82 | HR 95 | Ht 69.0 in | Wt 239.8 lb

## 2022-04-19 DIAGNOSIS — M5481 Occipital neuralgia: Secondary | ICD-10-CM | POA: Diagnosis not present

## 2022-04-19 DIAGNOSIS — R29898 Other symptoms and signs involving the musculoskeletal system: Secondary | ICD-10-CM

## 2022-04-19 DIAGNOSIS — G43009 Migraine without aura, not intractable, without status migrainosus: Secondary | ICD-10-CM

## 2022-04-19 MED ORDER — DULOXETINE HCL 20 MG PO CPEP: 20.0000 mg | ORAL_CAPSULE | Freq: Every day | ORAL | 6 refills | Status: DC

## 2022-04-19 NOTE — Progress Notes (Signed)
Referring:  Lewayne Bunting, MD [address] [place],  [place] [address]  PCP: [facility]  Neurology was asked to evaluate Sheri Johnson, a 36 year old female for a chief complaint of headaches.  Our recommendations of care will be communicated by shared medical record.    CC:  headaches, numbness  History provided from self  HPI:  Medical co-morbidities: SVT s/p ablation, HTN  The patient presents for evaluation of multiple symptoms which began last June. At that time she had a bad viral infection with a high fever. After her illness she developed palpitations, dizziness, neck pain, and headaches.  Headaches are described as constant throbbing and pressure which starts at the back of her head and radiates forward. They are associated with photophobia and nausea. No phonophobia. She also has a sensation of squeezing in her temples. She was started on gabapentin but wasn't able to tolerate this due to palpitations.  She has been episodes of lightheadedness and palpitations every time she tries to work out. Feels like her BP will drop, then she will become flushed, then develop numbness in her left arm. Feels like she is not getting oxygen when she is talking. She will also sometimes become lightheaded when she turns her head to the left. Denies vertigo or loss of consciousness. She does have a history of SVT and underwent ablation last month. Has follow up with Cardiology scheduled for next month.   She has also developed intermittent numbness in her left arm. Has a lot of pain in the back of her neck which will occasionally shoot down her left arm. Left side feels weaker than the right. She cannot hold the leash to walk her dog on the left side due to grip strength weakness. She has already scheduled sessions with physical therapy.  Recently had a sleep study which showed moderate sleep apnea during REM sleep, but did not meet criteria for  CPAP.  MRI brain without contrast 10/17/21 was normal. CTH 02/11/22 was unremarkable   Headache days per month: 30 Headache free days per month: 0  Current Treatment: Abortive Ibuprofen tylenol  Preventative none  Prior Therapies                                 Gabapentin - palpitations Excedrin migraine - palpitations  LABS: CBC    Component Value Date/Time   WBC 7.4 03/23/2022 1433   WBC 10.2 02/28/2022 1950   RBC 4.81 03/23/2022 1433   RBC 4.73 02/28/2022 1950   HGB 13.8 03/23/2022 1433   HCT 40.0 03/23/2022 1433   PLT 285 03/23/2022 1433   MCV 83 03/23/2022 1433   MCH 28.7 03/23/2022 1433   MCH 28.5 02/28/2022 1950   MCHC 34.5 03/23/2022 1433   MCHC 34.4 02/28/2022 1950   RDW 13.2 03/23/2022 1433   LYMPHSABS 2.2 03/23/2022 1433   MONOABS 1.0 01/30/2022 1710   EOSABS 0.2 03/23/2022 1433   BASOSABS 0.0 03/23/2022 1433      Latest Ref Rng & Units 03/23/2022    2:33 PM 02/28/2022    7:50 PM 02/11/2022   11:45 AM  CMP  Glucose 70 - 99 mg/dL 92  191  478   BUN 6 - 20 mg/dL 8  14  12    Creatinine 0.57 - 1.00 mg/dL  2.95  6.21   Sodium 134 - 144 mmol/L 137  138  138   Potassium 3.5 - 5.2 mmol/L 4.4  4.2  3.6   Chloride 96 - 106 mmol/L 100  105  106   CO2 20 - 29 mmol/L 24  26  25    Calcium 8.7 - 10.2 mg/dL  9.3  9.3      IMAGING:  CTH 02/11/22: unremarkable  MRI brain without contrast 10/17/21: unremarkable  C-spine X-ray 02/10/22: reversal of lordotic curvature, otherwise unremarkable  Imaging independently reviewed on April 19, 2022   Current Outpatient Medications on File Prior to Visit  Medication Sig Dispense Refill   albuterol (VENTOLIN HFA) 108 (90 Base) MCG/ACT inhaler Inhale 2 puffs into the lungs every 6 (six) hours as needed for wheezing or shortness of breath.     Ascorbic Acid (VITAMIN C) 1000 MG tablet Take 1,000 mg by mouth daily.     atenolol (TENORMIN) 25 MG tablet Take 3 tablets (75 mg total) by mouth at bedtime. 45 tablet 6   b  complex vitamins capsule Take 1 capsule by mouth daily.     cetirizine (ZYRTEC) 10 MG tablet Take 10 mg by mouth 2 (two) times daily.     clobetasol (TEMOVATE) 0.05 % external solution Apply 1 Application topically at bedtime. Apply to scalp     clonazePAM (KLONOPIN) 0.5 MG tablet Take 0.5-1 mg by mouth 2 (two) times daily as needed for anxiety.     Coenzyme Q10 (COQ10) 100 MG CAPS Take 100 mg by mouth daily.     cyclobenzaprine (FLEXERIL) 10 MG tablet Take 10 mg by mouth in the morning and at bedtime.     Digestive Enzymes CAPS Take 1 capsule by mouth 2 (two) times daily before a meal.     fluticasone (FLONASE) 50 MCG/ACT nasal spray Place 1 spray into both nostrils daily as needed for allergies or rhinitis.     fluticasone (FLOVENT HFA) 44 MCG/ACT inhaler Inhale 1 puff into the lungs 2 (two) times daily.     ibuprofen (ADVIL) 800 MG tablet Take 800 mg by mouth every 8 (eight) hours as needed for moderate pain.     levothyroxine (SYNTHROID) 88 MCG tablet Take 88 mcg by mouth daily before breakfast.     lubiprostone (AMITIZA) 24 MCG capsule Take 24 mcg by mouth 2 (two) times daily.     Magnesium 400 MG CAPS Take 400 mg by mouth daily as needed (Low Magnesium).     OVER THE COUNTER MEDICATION Take 2 tablets by mouth daily. Medication: Beet Root Supplement     OVER THE COUNTER MEDICATION Take 2 capsules by mouth at bedtime. Medication: Clear Lungs     pantoprazole (PROTONIX) 40 MG tablet Take 40 mg by mouth 2 (two) times daily.     potassium chloride SA (KLOR-CON M) 20 MEQ tablet Take 1 tablet (20 mEq total) by mouth daily. 30 tablet 6   Probiotic Product (PROBIOTIC DAILY PO) Take 2 each by mouth at bedtime.     TURMERIC CURCUMIN PO Take 2 capsules by mouth daily.     No current facility-administered medications on file prior to visit.     Allergies: Allergies  Allergen Reactions   Metoprolol Other (See Comments)    dizziness   Cipro [Ciprofloxacin Hcl] Palpitations   Prednisone  Palpitations    Family History: Family History  Problem Relation Age of Onset   Hypothyroidism Mother      Past Medical History: Past Medical History:  Diagnosis Date   Abortion 11/14/2021   Anxiety  Asthma    Frequent headaches    Hypertension    Hypothyroid    SVT (supraventricular tachycardia) (HCC)     Past Surgical History Past Surgical History:  Procedure Laterality Date   CESAREAN SECTION     x2   LAPAROSCOPIC APPENDECTOMY N/A 12/14/2016   Procedure: APPENDECTOMY LAPAROSCOPIC;  Surgeon: Griselda Miner, MD;  Location: [facility] OR;  Service: General;  Laterality: N/A;   SVT ABLATION N/A 03/31/2022   Procedure: SVT ABLATION;  Surgeon: Marinus Maw, MD;  Location: [facility] INVASIVE CV LAB;  Service: Cardiovascular;  Laterality: N/A;    Social History: Social History   Tobacco Use   Smoking status: Never   Smokeless tobacco: Never  Vaping Use   Vaping Use: Never used  Substance Use Topics   Alcohol use: Not Currently    Comment: Rare   Drug use: Never    ROS: Negative for fevers, chills. Positive for headaches dizziness. All other systems reviewed and negative unless stated otherwise in HPI.   Physical Exam:   Vital Signs: BP 129/82   Pulse 95   Ht 5\' 9"  (1.753 m)   Wt 239 lb 12.8 oz (108.8 kg)   BMI 35.41 kg/m  GENERAL: well appearing,in no acute distress,alert SKIN:  Color, texture, turgor normal. No rashes or lesions HEAD:  Normocephalic/atraumatic. CV:  RRR RESP: Normal respiratory effort MSK: +tenderness to palpation over bilateral occiput, neck, and shoulders  NEUROLOGICAL: Mental Status: Alert, oriented to person, place and time,Follows commands Cranial Nerves: PERRL, visual fields intact to confrontation, extraocular movements intact, facial sensation intact, no facial droop or ptosis, hearing grossly intact, no dysarthria Motor: 4/5 left arm extension and grip strength, 4/5 left hip flexion, otherwise 5/5 throughout Reflexes: 2+  throughout Sensation: decreased sensation to light touch over LUE and LLE Coordination: Finger-to- nose-finger intact bilaterally Gait: normal-based   IMPRESSION: 36 year old female with a history of SVT s/p ablation, HTN who presents for evaluation of headaches, neck pain, lightheadedness, and numbness. MRI C-spine ordered as exam reveals numbness/weakness of her left arm. Will include contrast to assess for inflammatory causes as symptoms began following a viral illness. She has tenderness at bilateral occipital notches, suggestive of occipital neuralgia. She is already scheduled with physical therapy for cervicalgia. Suspect she is also having migraines given associated photophobia and nausea. Will start Cymbalta for headache prevention, which may also help her anxiety. Lifestyle measures for orthostatic intolerance discussed.  PLAN: -Start Cymbalta 20 mg daily for headache prevention -MRI C-spine -Agree with neck PT -Lifestyle measures for orthostatic intolerance provided (increased hydration, slow position changes, physical counter maneuvers, etc) -Next steps: consider PRN triptan once palpitations are controlled, otherwise can consider PRN diclofenac or gepant   I spent a total of 41 minutes chart reviewing and counseling the patient. Headache education was done. Discussed treatment options including preventive medications. Discussed medication overuse headache and to limit use of acute treatments to no more than 2 days/week or 10 days/month. Discussed medication side effects, adverse reactions and drug interactions. Written educational materials and patient instructions outlining all of the above were given.  Follow-up: 6 months   20, MD 04/19/2022   12:08 PM

## 2022-04-19 NOTE — Telephone Encounter (Signed)
[employer] sent to GI they obtain auth  

## 2022-04-19 NOTE — Patient Instructions (Addendum)
MRI Cervical spine Start Cymbalta 20 mg daily for headache prevention  Guidelines for the Management of Orthostatic intolerance  1. Make all postural changes from lying to sitting or sitting to standing, slowly.  2. Drink to 2.0 -2.5 L of fluids per day.  3. Increase sodium in the diet to 3 - 5 g per day.  4. Avoid large meals which can cause low blood pressure during digestion. It is better to eat smaller meals more often than three large meals.  5. Avoid alcohol. Alcohol and cause blood to pool in the legs which may worsen low blood pressure reactions when standing.  6. Perform lower extremity exercises to improve strength of the leg muscles. This will help prevent blood from a pooling in the legs when standing and walking.  7. Raise the head of the bed by 6 to 10 inches. The entire bed must be at an angle. Raising only the head portion of the bed at waist level or using pillows will not be effective. Raising the head of the bed will reduce urine formation overnight and there will be more volume in the circulation in the morning.  8. During bad days, drink 500 cc of water quickly. This will result in an increased blood pressure within 5 minutes of drinking the water. The effect will last up to one hour and may improve orthostatic intolerance.  9. Use custom fitted elastic support stockings. These will reduce a tendency for blood to pool in the legs when standing and may improve orthostatic intolerance.  10. Use physical counter maneuvers such as leg crossing, squatting, or raising and resting the leg on a chair. These maneuvers increase blood pressure and can improve orthostatic intolerance.  11. Aerobic exercise. Start slow and gradually increase activity as tolerated

## 2022-04-21 ENCOUNTER — Emergency Department (HOSPITAL_BASED_OUTPATIENT_CLINIC_OR_DEPARTMENT_OTHER)
Admission: EM | Admit: 2022-04-21 | Discharge: 2022-04-21 | Disposition: A | Discharge status: Home / Self Care | Payer: 59 | Attending: Emergency Medicine | Admitting: Emergency Medicine

## 2022-04-21 ENCOUNTER — Other Ambulatory Visit: Payer: Self-pay

## 2022-04-21 ENCOUNTER — Telehealth: Payer: Self-pay | Admitting: Internal Medicine

## 2022-04-21 ENCOUNTER — Emergency Department (HOSPITAL_BASED_OUTPATIENT_CLINIC_OR_DEPARTMENT_OTHER): Payer: 59

## 2022-04-21 ENCOUNTER — Encounter (HOSPITAL_BASED_OUTPATIENT_CLINIC_OR_DEPARTMENT_OTHER): Payer: Self-pay | Admitting: *Deleted

## 2022-04-21 DIAGNOSIS — Z79899 Other long term (current) drug therapy: Secondary | ICD-10-CM | POA: Diagnosis not present

## 2022-04-21 DIAGNOSIS — H9202 Otalgia, left ear: Secondary | ICD-10-CM | POA: Diagnosis not present

## 2022-04-21 DIAGNOSIS — R590 Localized enlarged lymph nodes: Secondary | ICD-10-CM | POA: Diagnosis not present

## 2022-04-21 DIAGNOSIS — M542 Cervicalgia: Secondary | ICD-10-CM | POA: Insufficient documentation

## 2022-04-21 DIAGNOSIS — R079 Chest pain, unspecified: Secondary | ICD-10-CM | POA: Insufficient documentation

## 2022-04-21 DIAGNOSIS — E039 Hypothyroidism, unspecified: Secondary | ICD-10-CM | POA: Insufficient documentation

## 2022-04-21 DIAGNOSIS — R0602 Shortness of breath: Secondary | ICD-10-CM | POA: Diagnosis not present

## 2022-04-21 LAB — BASIC METABOLIC PANEL
Anion gap: 11 (ref 5–15)
BUN: 13 mg/dL (ref 6–20)
CO2: 26 mmol/L (ref 22–32)
Calcium: 9.2 mg/dL (ref 8.9–10.3)
Chloride: 100 mmol/L (ref 98–111)
Creatinine, Ser: 0.95 mg/dL (ref 0.44–1.00)
GFR, Estimated: >60 mL/min (ref >60)
Glucose, Bld: 96 mg/dL (ref 70–99)
Potassium: 4 mmol/L (ref 3.5–5.1)
Sodium: 137 mmol/L (ref 135–145)

## 2022-04-21 LAB — CBC
HCT: 40.4 % (ref 36.0–46.0)
Hemoglobin: 13.7 g/dL (ref 12.0–15.0)
MCH: 28 pg (ref 26.0–34.0)
MCHC: 33.9 g/dL (ref 30.0–36.0)
MCV: 82.6 fL (ref 80.0–100.0)
Platelets: 309 K/uL (ref 150–400)
RBC: 4.89 MIL/uL (ref 3.87–5.11)
RDW: 12.7 % (ref 11.5–15.5)
WBC: 8.3 K/uL (ref 4.0–10.5)
nRBC: 0 % (ref 0.0–0.2)

## 2022-04-21 LAB — TROPONIN I (HIGH SENSITIVITY)
Troponin I (High Sensitivity): <2 ng/L (ref <18)
Troponin I (High Sensitivity): <2 ng/L (ref <18)

## 2022-04-21 LAB — T4, FREE: Free T4: 1.04 ng/dL (ref 0.61–1.12)

## 2022-04-21 LAB — PREGNANCY, URINE: Preg Test, Ur: NEGATIVE

## 2022-04-21 LAB — TSH: TSH: 7.761 u[IU]/mL — ABNORMAL HIGH (ref 0.350–4.500)

## 2022-04-21 NOTE — ED Notes (Signed)
Attempt IV placement in [facility] without success.

## 2022-04-21 NOTE — ED Triage Notes (Signed)
Pt had an ablation on 03/31/2022 for SVT and she has had continued left sided chest pain which she describes as a squeezing as well as pressure in her neck and left side of throat (with some difficulty swallowing) and left arm pressure.  Pt recently had an espisode of increased HR up to 152 and she called ems and during this episode the pain and pressure was increased. Pt elected not to go to ED at this time.  Pt today called her cardiologist who recomended to come be evaluated in ED as the pressure and symptoms in her neck have increased over the last few days.

## 2022-04-21 NOTE — ED Notes (Signed)
Have chest "soreness"  under left breast and left axilla. ED MD at bedside

## 2022-04-21 NOTE — Discharge Instructions (Addendum)
You were seen in the emergency department for your chest pain and your neck pain.  Your work-up showed no signs of stress on your heart or abnormal heart rhythm.  Your lungs were fully inflated with no signs of any fluid and the ultrasound of your heart showed no fluid around your heart.  You did have some swollen lymph nodes in your neck which may be due to your recent sinus infection and could be contributing to your pain.  You should continue your work-up with neurology for your neck pain and follow-up with your primary doctor and cardiologist for recheck of your chest pain and your neck pain.  You should return to the emergency department if your pain gets significantly worse, you pass out, you have repetitive vomiting or if you have any other new or concerning symptoms.

## 2022-04-21 NOTE — ED Notes (Signed)
2nd Troponin obtained and to the lab 

## 2022-04-21 NOTE — Telephone Encounter (Signed)
Spoke with patient, she reports she had a catheter ablation about 3 weeks ago and has been experiencing consistent chest discomfort described as "squeezing". She also reports earlier this week her HR went up to 160 and she felt dizzy, reports this resolved on its own. She states she was seen by EMS with no acute issues found.  Patient asked if this is common to have this chest discomfort 3 weeks after ablation. Conferred with another triage nurse who advised patient go to urgent care/ED for further evaluation as this is not an expected post-ablation symptom.  Advised patient to go to urgent care/ED for further evaluation of chest pain.  Patient verbalized understanding and expressed appreciation for assistance.

## 2022-04-21 NOTE — ED Notes (Signed)
Called lab to add on TSH & T4

## 2022-04-21 NOTE — Telephone Encounter (Signed)
Pt c/o of Chest Pain: STAT if CP now or developed within 24 hours  1. Are you having CP right now? Yes  2. Are you experiencing any other symptoms (ex. SOB, nausea, vomiting, sweating)?  No  3. How long have you been experiencing CP? Has been increasing since surgery  4. Is your CP continuous or coming and going?   Continuous   5. Have you taken Nitroglycerin?  No  Patient stated she has been having chest discomfort.  Patient wants to know if this is normal after surgery.  Patient stated her HR was 160 on Tuesday, 9/12 when she had an emergency episode ?

## 2022-04-21 NOTE — ED Provider Notes (Signed)
[facility] EMERGENCY DEPARTMENT Provider Note   CSN: [id number] Arrival date & time: 04/21/22  1425     History {Add pertinent medical, surgical, social history, OB history to HPI:1} Chief Complaint  Patient presents with  . Chest Pain    Sheri Johnson is a 36 y.o. female.  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  Patient states that since her ablation she has had some soreness in her chest and intermittently has a pressure type of chest pain.  She states that she started to have pain radiating to the left side of her neck and down her left arm.  She states that she did have sinus congestion and left-sided ear pain for which she saw her primary doctor and was treated for a sinus infection.  She states that her sinus pressure and ear pain has improved but the pain on the left side of her neck and arm are still present.  She states that she does have some shortness of breath on exertion and occasionally will wake up at night with shortness of breath.  She did recently have a sleep study and did test positive for sleep apnea.  She denies any fevers, chills or cough.  She states that her procedure was accessed through her right groin and that she has had no symptoms in her groin.  The history is provided by the patient.  Chest Pain      Home Medications Prior to Admission medications   Medication Sig Start Date End Date Taking? Authorizing Provider  albuterol (VENTOLIN HFA) 108 (90 Base) MCG/ACT inhaler Inhale 2 puffs into the lungs every 6 (six) hours as needed for wheezing or shortness of breath.    [provider]  Ascorbic Acid (VITAMIN C) 1000 MG tablet Take 1,000 mg by mouth daily.    [provider]  atenolol (TENORMIN) 25 MG tablet Take 3 tablets (75 mg total)  by mouth at bedtime. 01/31/22   Barrett, Evelene Croon, PA-C  b complex vitamins capsule Take 1 capsule by mouth daily.    [provider]  cetirizine (ZYRTEC) 10 MG tablet Take 10 mg by mouth 2 (two) times daily.    [provider]  clobetasol (TEMOVATE) 0.05 % external solution Apply 1 Application topically at bedtime. Apply to scalp 02/27/22   [provider]  clonazePAM (KLONOPIN) 0.5 MG tablet Take 0.5-1 mg by mouth 2 (two) times daily as needed for anxiety.    [provider]  Coenzyme Q10 (COQ10) 100 MG CAPS Take 100 mg by mouth daily.    [provider]  cyclobenzaprine (FLEXERIL) 10 MG tablet Take 10 mg by mouth in the morning and at bedtime. 07/07/21   [provider]  Digestive Enzymes CAPS Take 1 capsule by mouth 2 (two) times daily before a meal.    [provider]  DULoxetine (CYMBALTA) 20 MG capsule Take 1 capsule (20 mg total) by mouth daily. 04/19/22   Genia Harold, MD  fluticasone (FLONASE) 50 MCG/ACT nasal spray Place 1 spray into both nostrils daily as needed for allergies or rhinitis.    [provider]  fluticasone (FLOVENT HFA) 44 MCG/ACT inhaler Inhale 1 puff into the lungs 2 (two) times daily.    [provider]  ibuprofen (ADVIL) 800 MG tablet Take 800 mg by mouth every 8 (  eight) hours as needed for moderate pain.    [provider]  levothyroxine (SYNTHROID) 88 MCG tablet Take 88 mcg by mouth daily before breakfast.    [provider]  lubiprostone (AMITIZA) 24 MCG capsule Take 24 mcg by mouth 2 (two) times daily. 02/23/22   [provider]  Magnesium 400 MG CAPS Take 400 mg by mouth daily as needed (Low Magnesium).    [provider]  OVER THE COUNTER MEDICATION Take 2 tablets by mouth daily. Medication: Beet Root Supplement    [provider]  OVER THE COUNTER MEDICATION Take 2 capsules by mouth at bedtime. Medication: Clear Lungs    [provider]  pantoprazole (PROTONIX) 40 MG tablet Take 40 mg by mouth 2 (two) times daily. 02/21/22   [provider]  potassium chloride SA (KLOR-CON M) 20 MEQ tablet Take 1 tablet (20 mEq total) by mouth daily. 12/14/21   Tereso Newcomer T, PA-C  Probiotic Product (PROBIOTIC DAILY PO) Take 2 each by mouth at bedtime.    [provider]  TURMERIC CURCUMIN PO Take 2 capsules by mouth daily.    [provider]      Allergies    Metoprolol, Cipro [ciprofloxacin hcl], and Prednisone    Review of Systems   Review of Systems  Cardiovascular:  Positive for chest pain.    Physical Exam Updated Vital Signs BP 118/69   Pulse 84   Temp 98.5 F (36.9 C) (Oral)   Resp 20   SpO2 94%  Physical Exam Vitals and nursing note reviewed.  Constitutional:      General: She is not in acute distress.    Appearance: She is well-developed.  HENT:     Head: Normocephalic and atraumatic.  Eyes:     Extraocular Movements: Extraocular movements intact.  Neck:     Thyroid: No thyromegaly.     Trachea: No tracheal deviation.  Cardiovascular:     Rate and Rhythm: Normal rate and regular rhythm.     Pulses:          Carotid pulses are 2+ on the right side and 2+ on the left side.      Radial pulses are 2+ on the right side and 2+ on the left side.       Dorsalis pedis pulses are 2+ on the right side and 2+ on the left side.     Heart sounds: Normal heart sounds.     Comments: No carotid bruit bilaterally Pulmonary:     Effort: Pulmonary effort is normal.     Breath sounds: Normal breath sounds.  Chest:     Chest wall: No tenderness.  Abdominal:     Palpations: Abdomen is soft.     Tenderness: There is no abdominal tenderness.  Musculoskeletal:     Cervical back: Normal range of motion and neck supple.     Right lower leg: No edema.     Left lower leg: No edema.  Lymphadenopathy:     Cervical: Cervical adenopathy present.  Skin:    General: Skin is warm and dry.     Findings:  No rash.  Neurological:     General: No focal deficit present.     Mental Status: She is alert and oriented to person, place, and time.  Psychiatric:        Mood and Affect: Mood normal.        Behavior: Behavior normal.    ED Results / Procedures / Treatments  Labs (all labs ordered are listed, but only abnormal results are displayed) Labs Reviewed  PREGNANCY, URINE  BASIC METABOLIC PANEL  CBC  TSH  T4, FREE  TROPONIN I (HIGH SENSITIVITY)  TROPONIN I (HIGH SENSITIVITY)    EKG EKG Interpretation  Date/Time:  Friday April 21 2022 14:43:20 EDT Ventricular Rate:  81 PR Interval:  150 QRS Duration: 82 QT Interval:  394 QTC Calculation: 457 R Axis:   67 Text Interpretation: Normal sinus rhythm Low voltage QRS Cannot rule out Anterior infarct , age undetermined Abnormal ECG No significant change since last tracing Confirmed by Arturo Morton ([id number]) on 04/21/2022 4:03:38 PM  Radiology DG Chest 2 View  Result Date: 04/21/2022 CLINICAL DATA:  Chest pain. EXAM: CHEST - 2 VIEW COMPARISON:  Chest radiograph dated February 28, 2022. FINDINGS: The heart size and mediastinal contours are within normal limits. Both lungs are clear. The visualized skeletal structures are unremarkable. IMPRESSION: No active cardiopulmonary disease. Electronically Signed   By: Larose Hires D.O.   On: 04/21/2022 15:03    Procedures Ultrasound ED Echo  Date/Time: 04/21/2022 4:20 PM  Performed by: Phoebe Sharps, DO Authorized by: Phoebe Sharps, DO   Procedure details:    Indications: chest pain     Views: subxiphoid, parasternal long axis view and parasternal short axis view     Images: archived   Findings:    Pericardium: no pericardial effusion     LV Function: normal (>50% EF)     RV Diameter: normal   Impression:    Impression: normal     {Document cardiac monitor, telemetry assessment procedure when appropriate:1}  Medications Ordered in ED Medications - No data to  display  ED Course/ Medical Decision Making/ A&P                           Medical Decision Making This patient presents to the ED with chief complaint(s) of chest pain with pertinent past medical history of SVT status post ablation, hypothyroidism, sleep apnea which further complicates the presenting complaint. The complaint involves an extensive differential diagnosis and also carries with it a high risk of complications and morbidity.    The differential diagnosis includes pericardial effusion, pericarditis, myocarditis, ACS, arrhythmia, esophageal perforation, pneumomediastinum, pneumothorax, hyperthyroidism, viral syndrome  Additional history obtained: Additional history obtained from N/A Records reviewed outpatient neurology and cardiology records  ED Course and Reassessment: Patient was recently evaluated by neurology for her neck pain and was thought to have occipital neuralgia versus a peripheral neuropathy and is referred to outpatient MRI.  This may be causing her left-sided pain.  She did also have some palpable cervical lymphadenopathy may be related to sinusitis causing her pain.  Due to her recent infectious symptoms she will be evaluated for pericarditis, myocarditis or complications from her ablation.  Labs, chest x-ray, EKG and POCUS will be performed.  Independent labs interpretation:  The following labs were independently interpreted: ***  Independent visualization of imaging: - I independently visualized the following imaging with scope of interpretation limited to determining acute life threatening conditions related to emergency care: ***, which revealed ***  Consultation: - Consulted or discussed management/test interpretation w/ external professional: ***  Consideration for admission or further workup: Social Determinants of health:    Amount and/or Complexity of Data Reviewed Labs: ordered. Radiology: ordered.   ***  {Document critical care time when  appropriate:1} {Document review of labs and clinical decision tools ie heart score,  Chads2Vasc2 etc:1}  {Document your independent review of radiology images, and any outside records:1} {Document your discussion with family members, caretakers, and with consultants:1} {Document social determinants of health affecting pt's care:1} {Document your decision making why or why not admission, treatments were needed:1} Final Clinical Impression(s) / ED Diagnoses Final diagnoses:  None    Rx / DC Orders ED Discharge Orders     None

## 2022-04-26 ENCOUNTER — Telehealth: Payer: Self-pay | Admitting: Cardiology

## 2022-04-26 NOTE — Telephone Encounter (Signed)
Pt is requesting call back in regards to ONO and update as to when or she will be receiving this.

## 2022-04-27 NOTE — Telephone Encounter (Signed)
Returned a call to the patient. She states that she still hasn't been contacted to get her ONO. After checking the [facility] portal the patient was informed that the order is " pending patient contact." Patient was informed that she should have been sent a link from [employer] to accept the order. She went back through her phone and found the text that was sent to her, with a contact number. She states that she will make contact with them to have this taken care of. She was advised to call me back if she does not get this cleared up. Patient voiced understanding and thanked me for returning her call.

## 2022-04-28 ENCOUNTER — Ambulatory Visit: Payer: 59 | Admitting: Nurse Practitioner

## 2022-04-30 ENCOUNTER — Ambulatory Visit
Admission: RE | Admit: 2022-04-30 | Discharge: 2022-04-30 | Disposition: A | Discharge status: Home / Self Care | Payer: 59 | Source: Ambulatory Visit | Attending: Psychiatry | Admitting: Psychiatry

## 2022-04-30 DIAGNOSIS — R29898 Other symptoms and signs involving the musculoskeletal system: Secondary | ICD-10-CM | POA: Diagnosis not present

## 2022-04-30 MED ORDER — GADOBENATE DIMEGLUMINE 529 MG/ML IV SOLN
20.0000 mL | Freq: Once | INTRAVENOUS | Status: AC | PRN
  Administered 2022-04-30: 20 mL via INTRAVENOUS

## 2022-05-03 ENCOUNTER — Telehealth (HOSPITAL_COMMUNITY): Payer: Self-pay | Admitting: *Deleted

## 2022-05-03 NOTE — Telephone Encounter (Signed)
Reaching out to patient to offer assistance regarding upcoming cardiac imaging study; pt verbalizes understanding of appt date/time, parking situation and where to check in, medications ordered, and verified current allergies; name and call back number provided for further questions should they arise  Sheri Clement RN Navigator Cardiac Imaging [facility] [phone] office [phone] cell  Patient to take 10mg  ivabradine two hours prior to her cardiac CT scan.  She is aware to arrive at 8:30am.

## 2022-05-04 ENCOUNTER — Ambulatory Visit (HOSPITAL_COMMUNITY)
Admission: RE | Admit: 2022-05-04 | Discharge: 2022-05-04 | Disposition: A | Discharge status: Home / Self Care | Payer: 59 | Source: Ambulatory Visit | Attending: Nurse Practitioner | Admitting: Nurse Practitioner

## 2022-05-04 DIAGNOSIS — E039 Hypothyroidism, unspecified: Secondary | ICD-10-CM | POA: Diagnosis present

## 2022-05-04 DIAGNOSIS — I471 Supraventricular tachycardia: Secondary | ICD-10-CM | POA: Insufficient documentation

## 2022-05-04 DIAGNOSIS — R0602 Shortness of breath: Secondary | ICD-10-CM | POA: Diagnosis present

## 2022-05-04 DIAGNOSIS — I10 Essential (primary) hypertension: Secondary | ICD-10-CM | POA: Diagnosis present

## 2022-05-04 DIAGNOSIS — R072 Precordial pain: Secondary | ICD-10-CM | POA: Insufficient documentation

## 2022-05-04 MED ORDER — NITROGLYCERIN 0.4 MG SL SUBL
SUBLINGUAL_TABLET | SUBLINGUAL | Status: AC
  Filled 2022-05-04: qty 2

## 2022-05-04 MED ORDER — DILTIAZEM HCL 25 MG/5ML IV SOLN
INTRAVENOUS | Status: AC
  Filled 2022-05-04: qty 5

## 2022-05-04 MED ORDER — DILTIAZEM HCL 25 MG/5ML IV SOLN
10.0000 mg | Freq: Once | INTRAVENOUS | Status: AC
  Administered 2022-05-04: 5 mg via INTRAVENOUS

## 2022-05-04 MED ORDER — NITROGLYCERIN 0.4 MG SL SUBL
0.8000 mg | SUBLINGUAL_TABLET | Freq: Once | SUBLINGUAL | Status: AC
  Administered 2022-05-04: 0.8 mg via SUBLINGUAL

## 2022-05-04 MED ORDER — IOHEXOL 350 MG/ML SOLN
95.0000 mL | Freq: Once | INTRAVENOUS | Status: AC | PRN
  Administered 2022-05-04: 95 mL via INTRAVENOUS

## 2022-05-11 ENCOUNTER — Ambulatory Visit: Payer: 59 | Attending: Internal Medicine | Admitting: Internal Medicine

## 2022-05-11 VITALS — BP 130/86 | HR 94 | Ht 69.0 in | Wt 239.0 lb

## 2022-05-11 DIAGNOSIS — I10 Essential (primary) hypertension: Secondary | ICD-10-CM | POA: Diagnosis not present

## 2022-05-11 DIAGNOSIS — I471 Supraventricular tachycardia, unspecified: Secondary | ICD-10-CM | POA: Diagnosis not present

## 2022-05-11 NOTE — Progress Notes (Signed)
HPI Sheri Johnson returns today for followup. 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 She was exercising and had recurrent sensation of a fast HR but when the paramedics were called, she had no SVT. She underwent a sleep study and has sleep apnea. She has not been back for followup. She has a remote h/o asthma.  Allergies  Allergen Reactions   Metoprolol Other (See Comments)    dizziness   Cipro [Ciprofloxacin Hcl] Palpitations   Prednisone Palpitations     Current Outpatient Medications  Medication Sig Dispense Refill   albuterol (VENTOLIN HFA) 108 (90 Base) MCG/ACT inhaler Inhale 2 puffs into the lungs every 6 (six) hours as needed for wheezing or shortness of breath.     Ascorbic Acid (VITAMIN C) 1000 MG tablet Take 1,000 mg by mouth daily.     atenolol (TENORMIN) 25 MG tablet Take 3 tablets (75 mg total) by mouth at bedtime. 45 tablet 6   b complex vitamins capsule Take 1 capsule by mouth daily.     cetirizine (ZYRTEC) 10 MG tablet Take 10 mg by mouth 2 (two) times daily.     clobetasol (TEMOVATE) 0.05 % external solution Apply 1 Application topically at bedtime. Apply to scalp     clonazePAM (KLONOPIN) 0.5 MG tablet Take 0.5-1 mg by mouth 2 (two) times daily as needed for anxiety.     Coenzyme Q10 (COQ10) 100 MG CAPS Take 100 mg by mouth daily.     cyclobenzaprine (FLEXERIL) 10 MG tablet Take 10 mg by mouth in the morning and at bedtime.     Digestive Enzymes CAPS Take 1 capsule by mouth 2 (two) times daily before a meal.     DULoxetine (CYMBALTA) 20 MG capsule Take 1 capsule (20 mg total) by mouth daily. 30 capsule 6   fluticasone (FLONASE) 50 MCG/ACT nasal spray Place 1 spray into both nostrils daily as needed for allergies or rhinitis.     fluticasone (FLOVENT HFA) 44 MCG/ACT inhaler Inhale 1 puff into the lungs 2 (two) times daily.     ibuprofen (ADVIL) 800 MG tablet  Take 800 mg by mouth every 8 (eight) hours as needed for moderate pain.     levothyroxine (SYNTHROID) 88 MCG tablet Take 88 mcg by mouth daily before breakfast.     Magnesium 400 MG CAPS Take 400 mg by mouth daily as needed (Low Magnesium).     OVER THE COUNTER MEDICATION Take 2 tablets by mouth daily. Medication: Beet Root Supplement     OVER THE COUNTER MEDICATION Take 2 capsules by mouth at bedtime. Medication: Clear Lungs     pantoprazole (PROTONIX) 40 MG tablet Take 40 mg by mouth daily.     potassium chloride SA (KLOR-CON M) 20 MEQ tablet Take 1 tablet (20 mEq total) by mouth daily. 30 tablet 6   Probiotic Product (PROBIOTIC DAILY PO) Take 2 each by mouth at bedtime.     TURMERIC CURCUMIN PO Take 2 capsules by mouth daily.     lubiprostone (AMITIZA) 24 MCG capsule Take 24 mcg by mouth 2 (two) times daily. (Patient not taking: Reported on 05/11/2022)     No current facility-administered medications for this visit.     Past Medical History:  Diagnosis Date   Abortion 11/14/2021   Anxiety    Asthma    Frequent headaches  Hypertension    Hypothyroid    SVT (supraventricular tachycardia) (HCC)     ROS:   All systems reviewed and negative except as noted in the HPI.   Past Surgical History:  Procedure Laterality Date   CESAREAN SECTION     x2   LAPAROSCOPIC APPENDECTOMY N/A 12/14/2016   Procedure: APPENDECTOMY LAPAROSCOPIC;  Surgeon: Griselda Miner, MD;  Location: [facility] OR;  Service: General;  Laterality: N/A;   SVT ABLATION N/A 03/31/2022   Procedure: SVT ABLATION;  Surgeon: Marinus Maw, MD;  Location: [facility] INVASIVE CV LAB;  Service: Cardiovascular;  Laterality: N/A;     Family History  Problem Relation Age of Onset   Hypothyroidism Mother      Social History   Socioeconomic History   Marital status: Single    Spouse name: Not on file   Number of children: 3   Years of education: Not on file   Highest education level: Associate degree: academic program   Occupational History   Not on file  Tobacco Use   Smoking status: Never   Smokeless tobacco: Never  Vaping Use   Vaping Use: Never used  Substance and Sexual Activity   Alcohol use: Not Currently    Comment: Rare   Drug use: Never   Sexual activity: Not on file  Other Topics Concern   Not on file  Social History Narrative   Caffeine- none   Social Determinants of Health   Financial Resource Strain: Not on file  Food Insecurity: Not on file  Transportation Needs: Not on file  Physical Activity: Not on file  Stress: Not on file  Social Connections: Not on file  Intimate Partner Violence: Not on file     BP 130/86   Pulse 94   Ht 5\' 9"  (1.753 m)   Wt 239 lb (108.4 kg)   SpO2 97%   BMI 35.29 kg/m   Physical Exam:  Well appearing overweight woman, NAD HEENT: Unremarkable Neck:  No JVD, no thyromegally Lymphatics:  No adenopathy Back:  No CVA tenderness Lungs:  Clear with no wheezes HEART:  Regular rate rhythm, no murmurs, no rubs, no clicks Abd:  soft, positive bowel sounds, no organomegally, no rebound, no guarding Ext:  2 plus pulses, no edema, no cyanosis, no clubbing Skin:  No rashes no nodules Neuro:  CN II through XII intact, motor grossly intact  EKG - nsr  Assess/Plan:  SVT - no clear cut recurrence. She will undergo watchful waiting.  Asthma - she will continue her bronchodilators. Sleep apnea - she has not been prescribed CPAP. I will ask her to followup with Dr. . Obesity - she is encouraged to lose weight.   Tresa Endo Z[place] Santa,MD

## 2022-05-11 NOTE — Patient Instructions (Addendum)
Medication Instructions:  Your physician recommends that you continue on your current medications as directed. Please refer to the Current Medication list given to you today.  *If you need a refill on your cardiac medications before your next appointment, please call your pharmacy*  Lab Work: None ordered.  If you have labs (blood work) drawn today and your tests are completely normal, you will receive your results only by: [facility] (if you have MyChart) OR A paper copy in the mail If you have any lab test that is abnormal or we need to change your treatment, we will call you to review the results.  Testing/Procedures: A PFT with DLCO test ordered by Dr. Cristopher Peru   Follow-Up:  Test results will determine follow up appointment with Dr. Beckie Salts.    Pulmonary Function Tests Pulmonary function tests (PFTs) are breathing tests that are used to measure how well your lungs work, find out what is causing your lung problems, and figure out the best treatment for you. You may have PFTs: When you have an illness involving your lungs. To watch for changes in your lung function over time if you have a long-term (chronic) lung disease. If you are an Nature conservation officer. PFTs check the effects of being exposed to chemicals over a long period of time. To check for diseases that affect the lungs, such as asthma or chronic obstructive pulmonary disease (COPD). To check lung function before having surgery or other procedures. To check your lungs, if you smoke. To check if prescribed medicines or treatments are helping your lungs. Your results will be compared with the expected lung function of someone with healthy lungs who is similar to you in several ways. These include age, sex, height, weight, and race or ethnicity. This is done to show how your lung function compares with normal lung function (percent predicted). The percent predicted helps your health care provider to know if your  lung function is normal or not. If you have had PFTs done before, your health care provider will compare your current results with past results. This shows if your lung function is better, worse, or the same as before. Tell a health care provider about: Any allergies you have. All medicines you are taking, including inhaler or nebulizer medicines, vitamins, herbs, eye drops, creams, and over-the-counter medicines. Any blood disorders you have. Any surgeries you have had, especially recent surgery of the eye, abdomen, or chest. These can make PFTs difficult or unsafe. Any medical conditions you have, including chest pain or heart problems, tuberculosis, or respiratory infections such as pneumonia, a cold, or the flu. Any fear of being in closed spaces (claustrophobia). Some of your tests may be in a closed space. What are the risks? Generally, this is a safe procedure. However, problems may occur, including: Feeling light-headed due to fast, deep breathing known as over-breathing or hyperventilation. An asthma attack from deep breathing. What happens before the procedure? Take over-the-counter and prescription medicines only as told by your health care provider. If you take inhaler or nebulizer medicines, ask your health care provider which medicines you should take on the day of your testing. Some inhaler medicines may interfere with PFTs if they are taken shortly before the tests. Follow instructions from your health care provider about eating or drinking restrictions. These may include avoiding eating large meals and avoiding using caffeine before the testing. Do not drink alcohol for up to 4 hours before the test. Do not smoke for up to  4 hours before your procedure. This includes e-cigarettes. Wear comfortable clothing that will not interfere with breathing. Avoid exercise that takes a lot of effort (strenuous exercise) for at least 30 minutes before the testing. What happens during the  procedure?  You will be given a soft nose clip to wear. This is done so all of your breaths will go through your mouth instead of your nose. You will be given a germ-free (sterile) mouthpiece. It will be attached to a machine, called a spirometer, that measures your breathing. You will be asked to do various breathing exercises. The exercises will be done by breathing in (inhaling) and breathing out (exhaling). You may be asked to repeat the exercises several times before the testing is complete. It is important to follow the instructions exactly to get accurate results. Make sure to blow as hard and as fast as you can when you are told to do so. You may be given a medicine called a bronchodilator that makes the small air passages in your lungs larger. This medicine will make it easier for you to breathe. The tests will then be repeated after the medicine takes effect. You will be watched carefully during the procedure for any problems, such as feeling faint or dizzy, or having trouble breathing. The procedure may vary among health care providers and hospitals. What can I expect after the test procedure? It is up to you to get the results of your procedure. Ask your health care provider, or the department that is doing the procedure, when your results will be ready. After you have received your results, talk with your health care provider about treatment options, if necessary. Follow these instructions at home: Do not use any products that contain nicotine or tobacco, such as cigarettes, e-cigarettes, and chewing tobacco. If you need help quitting, ask your health care provider. Summary Pulmonary function tests (PFTs) are used to measure how well your lungs work, find out what is causing your lung problems, and figure out the best treatment for you. If you have had PFTs done before, your health care provider will compare your current results with past results. This shows if your lung function is  better, worse, or the same as before. Wear comfortable clothing that will not interfere with breathing when you are tested. It is up to you to get the results of your procedure. After you have received them, talk with your health care provider about treatment options, if necessary. This information is not intended to replace advice given to you by your health care provider. Make sure you discuss any questions you have with your health care provider. Document Revised: 11/24/2021 Document Reviewed: 07/22/2019 Elsevier Patient Education  2023 [employer].

## 2022-05-11 NOTE — Progress Notes (Signed)
HPI: FU SVT. Had covid 1/22. Seen with palpitations 01/08/21; was in SVT (ECG with SVT 231 with diffuse ST depression); EMS terminated arrhythmia with adenosine. 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 Echocardiogram June 2023 showed normal LV function.  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  Cardiac CTA September 2023 showed calcium score 0 and no coronary disease; dilated main pulmonary artery at 31 mm suggestive of pulmonary hypertension.  Since last seen she has occasional dyspnea on exertion.  No orthopnea, PND, pedal edema.  Occasional chest heaviness at night.  No syncope.  She did have an episode of elevated heart rate after exercising 10 days following her ablation but no further symptoms since then.  Current Outpatient Medications  Medication Sig Dispense Refill   albuterol (VENTOLIN HFA) 108 (90 Base) MCG/ACT inhaler Inhale 2 puffs into the lungs every 6 (six) hours as needed for wheezing or shortness of breath.     Ascorbic Acid (VITAMIN C) 1000 MG tablet Take 1,000 mg by mouth daily.     atenolol (TENORMIN) 25 MG tablet Take 3 tablets (75 mg total) by mouth at bedtime. 45 tablet 6   b complex vitamins capsule Take 1 capsule by mouth daily.     cetirizine (ZYRTEC) 10 MG tablet Take 10 mg by mouth 2 (two) times daily.     clobetasol (TEMOVATE) 0.05 % external solution Apply 1 Application topically at bedtime. Apply to scalp     clonazePAM (KLONOPIN) 0.5 MG tablet Take 0.5-1 mg by mouth 2 (two) times daily as needed for anxiety.     Coenzyme Q10 (COQ10) 100 MG CAPS Take 100 mg by mouth daily.     cyclobenzaprine (FLEXERIL) 10 MG tablet Take 10 mg by mouth in the morning and at bedtime.     Digestive Enzymes CAPS Take 1 capsule by mouth 2 (two) times daily before a meal.     DULoxetine (CYMBALTA) 20 MG capsule Take 1 capsule (20 mg total) by mouth daily. 30 capsule 6   fluticasone (FLONASE) 50 MCG/ACT nasal  spray Place 1 spray into both nostrils daily as needed for allergies or rhinitis.     fluticasone (FLOVENT HFA) 44 MCG/ACT inhaler Inhale 1 puff into the lungs 2 (two) times daily.     ibuprofen (ADVIL) 800 MG tablet Take 800 mg by mouth every 8 (eight) hours as needed for moderate pain.     levothyroxine (SYNTHROID) 88 MCG tablet Take 88 mcg by mouth daily before breakfast.     Magnesium 400 MG CAPS Take 400 mg by mouth daily as needed (Low Magnesium).     norethindrone (MICRONOR) 0.35 MG tablet Take 1 tablet by mouth daily.     OVER THE COUNTER MEDICATION Take 2 tablets by mouth daily. Medication: Beet Root Supplement     OVER THE COUNTER MEDICATION Take 2 capsules by mouth at bedtime. Medication: Clear Lungs     pantoprazole (PROTONIX) 40 MG tablet Take 40 mg by mouth daily.     potassium chloride SA (KLOR-CON M) 20 MEQ tablet Take 1 tablet (20 mEq total) by mouth daily. 30 tablet 6   Probiotic Product (PROBIOTIC DAILY PO) Take 2 each by mouth at bedtime.     TURMERIC CURCUMIN PO Take 2 capsules by mouth daily.     No current facility-administered medications for this visit.     Past Medical History:  Diagnosis Date   Abortion 11/14/2021   Anxiety  Asthma    Frequent headaches    Hypertension    Hypothyroid    SVT (supraventricular tachycardia)     Past Surgical History:  Procedure Laterality Date   CESAREAN SECTION     x2   LAPAROSCOPIC APPENDECTOMY N/A 12/14/2016   Procedure: APPENDECTOMY LAPAROSCOPIC;  Surgeon: Jovita Kussmaul, MD;  Location: [facility];  Service: General;  Laterality: N/A;   SVT ABLATION N/A 03/31/2022   Procedure: SVT ABLATION;  Surgeon: Evans Lance, MD;  Location: [facility];  Service: Cardiovascular;  Laterality: N/A;    Social History   Socioeconomic History   Marital status: Single    Spouse name: Not on file   Number of children: 3   Years of education: Not on file   Highest education level: Associate degree: academic program   Occupational History   Not on file  Tobacco Use   Smoking status: Never   Smokeless tobacco: Never  Vaping Use   Vaping Use: Never used  Substance and Sexual Activity   Alcohol use: Not Currently    Comment: Rare   Drug use: Never   Sexual activity: Not on file  Other Topics Concern   Not on file  Social History Narrative   Caffeine- none   Social Determinants of Health   Financial Resource Strain: Not on file  Food Insecurity: Not on file  Transportation Needs: Not on file  Physical Activity: Not on file  Stress: Not on file  Social Connections: Not on file  Intimate Partner Violence: Not on file    Family History  Problem Relation Age of Onset   Hypothyroidism Mother     ROS: no fevers or chills, productive cough, hemoptysis, dysphasia, odynophagia, melena, hematochezia, dysuria, hematuria, rash, seizure activity, orthopnea, PND, pedal edema, claudication. Remaining systems are negative.  Physical Exam: Well-developed well-nourished in no acute distress.  Skin is warm and dry.  HEENT is normal.  Neck is supple.  Chest is clear to auscultation with normal expansion.  Cardiovascular exam is regular rate and rhythm.  Abdominal exam nontender or distended. No masses palpated. Extremities show no edema. neuro grossly intact  A/P  1 supraventricular tachycardia-patient is status post ablation of AV nodal reentrant tachycardia.  She would like to wean the beta-blocker to improve her heart rate with activities.  Decrease atenolol to 50 mg daily for 2 days then 25 mg daily for 2 days then discontinue.  She will then take 50 mg daily as needed for palpitations.  Follow-up for recurrent SVT.  2 hypertension-blood pressure controlled.  However given that we are weaning atenolol to off will add Avapro 150 mg daily.  Check potassium and renal function in 1 week.  Follow blood pressure and adjust medications as needed.  3 history of chest pain-no recurrent symptoms and recent  cardiac CTA showed no coronary disease.  4 anxiety-managed by primary care.  5 possible sleep apnea/snoring-patient has had a sleep study.  We will review this with Dr. Claiborne Billings.  Can also consider referral to pulmonary.  Kirk Ruths, MD

## 2022-05-12 ENCOUNTER — Encounter: Payer: Self-pay | Admitting: Internal Medicine

## 2022-05-12 ENCOUNTER — Telehealth: Payer: Self-pay

## 2022-05-12 NOTE — Telephone Encounter (Addendum)
Spoke with patient to give her CCTA results. She stated Dr. Lovena Le reviewed the results with he yesterday. His concern is sleep apnea and pulm HTN. He ordered a "lung test." She said Dr. Lovena Le was going to contact Dr. Stanford Breed to discuss her case.

## 2022-05-12 NOTE — Telephone Encounter (Signed)
Lmom, to discuss Coronary CTA results.  Waiting on a return call.

## 2022-05-12 NOTE — Telephone Encounter (Signed)
Patient returning call.

## 2022-05-14 ENCOUNTER — Emergency Department (HOSPITAL_COMMUNITY): Payer: 59

## 2022-05-14 ENCOUNTER — Other Ambulatory Visit: Payer: Self-pay

## 2022-05-14 ENCOUNTER — Emergency Department (HOSPITAL_COMMUNITY)
Admission: EM | Admit: 2022-05-14 | Discharge: 2022-05-15 | Disposition: A | Discharge status: Home / Self Care | Payer: 59 | Attending: Emergency Medicine | Admitting: Emergency Medicine

## 2022-05-14 ENCOUNTER — Encounter (HOSPITAL_COMMUNITY): Payer: Self-pay

## 2022-05-14 DIAGNOSIS — R079 Chest pain, unspecified: Secondary | ICD-10-CM | POA: Diagnosis present

## 2022-05-14 DIAGNOSIS — Y900 Blood alcohol level of less than 20 mg/100 ml: Secondary | ICD-10-CM | POA: Diagnosis not present

## 2022-05-14 DIAGNOSIS — R112 Nausea with vomiting, unspecified: Secondary | ICD-10-CM | POA: Insufficient documentation

## 2022-05-14 DIAGNOSIS — F1990 Other psychoactive substance use, unspecified, uncomplicated: Secondary | ICD-10-CM

## 2022-05-14 DIAGNOSIS — F19939 Other psychoactive substance use, unspecified with withdrawal, unspecified: Secondary | ICD-10-CM | POA: Insufficient documentation

## 2022-05-14 LAB — I-STAT BETA HCG BLOOD, ED (MC, WL, AP ONLY): I-stat hCG, quantitative: <5 m[IU]/mL (ref <5)

## 2022-05-14 LAB — SALICYLATE LEVEL: Salicylate Lvl: <7 mg/dL — ABNORMAL LOW (ref 7.0–30.0)

## 2022-05-14 LAB — ETHANOL: Alcohol, Ethyl (B): <10 mg/dL (ref <10)

## 2022-05-14 LAB — BASIC METABOLIC PANEL
Anion gap: 6 (ref 5–15)
BUN: 20 mg/dL (ref 6–20)
CO2: 24 mmol/L (ref 22–32)
Calcium: 8.4 mg/dL — ABNORMAL LOW (ref 8.9–10.3)
Chloride: 107 mmol/L (ref 98–111)
Creatinine, Ser: 1.07 mg/dL — ABNORMAL HIGH (ref 0.44–1.00)
GFR, Estimated: >60 mL/min (ref >60)
Glucose, Bld: 175 mg/dL — ABNORMAL HIGH (ref 70–99)
Potassium: 3.7 mmol/L (ref 3.5–5.1)
Sodium: 137 mmol/L (ref 135–145)

## 2022-05-14 LAB — ACETAMINOPHEN LEVEL: Acetaminophen (Tylenol), Serum: <10 ug/mL — ABNORMAL LOW (ref 10–30)

## 2022-05-14 LAB — TROPONIN I (HIGH SENSITIVITY): Troponin I (High Sensitivity): <2 ng/L (ref <18)

## 2022-05-14 MED ORDER — ONDANSETRON HCL 4 MG/2ML IJ SOLN
4.0000 mg | Freq: Once | INTRAMUSCULAR | Status: AC
  Administered 2022-05-14: 4 mg via INTRAVENOUS
  Filled 2022-05-14: qty 2

## 2022-05-14 MED ORDER — SODIUM CHLORIDE 0.9 % IV BOLUS
1000.0000 mL | Freq: Once | INTRAVENOUS | Status: AC
  Administered 2022-05-14: 1000 mL via INTRAVENOUS

## 2022-05-14 NOTE — ED Triage Notes (Signed)
Patient arrived via [facility] with complaints of chest pain and nausea after taking a delta 8 gummy. No vomiting.

## 2022-05-14 NOTE — ED Provider Triage Note (Signed)
Emergency Medicine Provider Triage Evaluation Note  Sheri Johnson , a 36 y.o. female  was evaluated in triage.  Pt complains of chest pain and not feeling well.  Took a delta 8 gummy that her significant other recently bought from a smoke shop.  She denies any illicit substance use.  States she feels sleepy and her chest feels heavy. States has never used illicit substances previously   Review of Systems  Positive: CP Negative:   Physical Exam  BP 118/62   Pulse 94   Temp 98.2 F (36.8 C) (Oral)   Resp 17   SpO2 97%  Gen:   Awake, no distress   Resp:  Normal effort  MSK:   Moves extremities without difficulty  Other:    Medical Decision Making  Medically screening exam initiated at 9:05 PM.  Appropriate orders placed.  Sheri Johnson was informed that the remainder of the evaluation will be completed by another provider, this initial triage assessment does not replace that evaluation, and the importance of remaining in the ED until their evaluation is complete.  CP after using delta 8   Sheri Johnson A, PA-C 05/14/22 2106

## 2022-05-14 NOTE — ED Provider Notes (Signed)
[facility]-EMERGENCY DEPT Provider Note   CSN: [id number] Arrival date & time: 05/14/22  2039    History  Chief Complaint  Patient presents with   Chest Pain    After delta 8 gummy     Sheri Johnson is a 36 y.o. female with no chronic medical problems here for evaluation of feeling unwell.  States she took a delta 8 gummy and that her significant other recently purchased from the smoke shop.  She has never had this or illicit substances previously.  She developed nausea, emesis and chest pain.  Chest pain diffuse in nature.  Does not radiate to back.  No abdominal pain.  No shortness of breath.  No lower extremity swelling.  She states she feels very sleepy. No etoh use. Denies intent at self harm. Denies any injury.  HPI     Home Medications Prior to Admission medications   Medication Sig Start Date End Date Taking? Authorizing Provider  albuterol (VENTOLIN HFA) 108 (90 Base) MCG/ACT inhaler Inhale 2 puffs into the lungs every 6 (six) hours as needed for wheezing or shortness of breath.    [provider]  Ascorbic Acid (VITAMIN C) 1000 MG tablet Take 1,000 mg by mouth daily.    [provider]  atenolol (TENORMIN) 25 MG tablet Take 3 tablets (75 mg total) by mouth at bedtime. 01/31/22   Barrett, Joline Salt, PA-C  b complex vitamins capsule Take 1 capsule by mouth daily.    [provider]  cetirizine (ZYRTEC) 10 MG tablet Take 10 mg by mouth 2 (two) times daily.    [provider]  clobetasol (TEMOVATE) 0.05 % external solution Apply 1 Application topically at bedtime. Apply to scalp 02/27/22   [provider]  clonazePAM (KLONOPIN) 0.5 MG tablet Take 0.5-1 mg by mouth 2 (two) times daily as needed for anxiety.    [provider]  Coenzyme Q10 (COQ10) 100 MG CAPS Take 100 mg by mouth daily.    [provider]  cyclobenzaprine (FLEXERIL) 10 MG tablet Take 10 mg by mouth in the morning and at bedtime.  07/07/21   [provider]  Digestive Enzymes CAPS Take 1 capsule by mouth 2 (two) times daily before a meal.    [provider]  DULoxetine (CYMBALTA) 20 MG capsule Take 1 capsule (20 mg total) by mouth daily. 04/19/22   Ocie Doyne, MD  DULoxetine (CYMBALTA) 30 MG capsule Take 30 mg by mouth daily. 04/17/22   [provider]  fluticasone (FLONASE) 50 MCG/ACT nasal spray Place 1 spray into both nostrils daily as needed for allergies or rhinitis.    [provider]  fluticasone (FLOVENT HFA) 44 MCG/ACT inhaler Inhale 1 puff into the lungs 2 (two) times daily.    [provider]  ibuprofen (ADVIL) 800 MG tablet Take 800 mg by mouth every 8 (eight) hours as needed for moderate pain.    [provider]  levothyroxine (SYNTHROID) 88 MCG tablet Take 88 mcg by mouth daily before breakfast.    [provider]  lubiprostone (AMITIZA) 24 MCG capsule Take 24 mcg by mouth 2 (two) times daily. Patient not taking: Reported on 05/11/2022 02/23/22   [provider]  Magnesium 400 MG CAPS Take 400 mg by mouth daily as needed (Low Magnesium).    [provider]  norethindrone (MICRONOR) 0.35 MG tablet Take 1 tablet by mouth daily. 04/21/22   [provider]  OVER THE COUNTER MEDICATION Take 2 tablets  by mouth daily. Medication: Beet Root Supplement    [provider]  OVER THE COUNTER MEDICATION Take 2 capsules by mouth at bedtime. Medication: Clear Lungs    [provider]  pantoprazole (PROTONIX) 40 MG tablet Take 40 mg by mouth daily. 02/21/22   [provider]  potassium chloride SA (KLOR-CON M) 20 MEQ tablet Take 1 tablet (20 mEq total) by mouth daily. 12/14/21   Tereso Newcomer T, PA-C  Probiotic Product (PROBIOTIC DAILY PO) Take 2 each by mouth at bedtime.    [provider]  TURMERIC CURCUMIN PO Take 2 capsules by mouth daily.    [provider]      Allergies    Metoprolol,  Cipro [ciprofloxacin hcl], and Prednisone    Review of Systems   Review of Systems  Constitutional: Negative.   HENT: Negative.    Cardiovascular:  Positive for chest pain. Negative for palpitations and leg swelling.  Gastrointestinal: Negative.   Genitourinary: Negative.   Musculoskeletal: Negative.   Skin: Negative.   Neurological: Negative.   All other systems reviewed and are negative.   Physical Exam Updated Vital Signs BP (!) 123/58   Pulse 81   Temp 98.2 F (36.8 C) (Oral)   Resp 12   SpO2 98%  Physical Exam Vitals and nursing note reviewed.  Constitutional:      General: She is not in acute distress.    Appearance: She is well-developed. She is not ill-appearing, toxic-appearing or diaphoretic.  HENT:     Head: Atraumatic.  Eyes:     Pupils: Pupils are equal, round, and reactive to light.  Cardiovascular:     Rate and Rhythm: Normal rate.     Pulses:          Radial pulses are 2+ on the right side and 2+ on the left side.     Heart sounds: Normal heart sounds.  Pulmonary:     Effort: Pulmonary effort is normal. No respiratory distress.     Breath sounds: Normal breath sounds.  Chest:     Chest wall: No mass, deformity, tenderness or edema.  Abdominal:     General: Bowel sounds are normal. There is no distension or abdominal bruit.     Palpations: Abdomen is soft. There is no hepatomegaly.     Tenderness: There is no abdominal tenderness.  Musculoskeletal:        General: Normal range of motion.     Cervical back: Normal range of motion.     Right lower leg: No tenderness. No edema.     Left lower leg: No tenderness. No edema.  Skin:    General: Skin is warm and dry.  Neurological:     General: No focal deficit present.     Mental Status: She is alert.  Psychiatric:        Mood and Affect: Mood normal.    ED Results / Procedures / Treatments   Labs (all labs ordered are listed, but only abnormal results are displayed) Labs Reviewed  BASIC  METABOLIC PANEL - Abnormal; Notable for the following components:      Result Value   Glucose, Bld 175 (*)    Creatinine, Ser 1.07 (*)    Calcium 8.4 (*)    All other components within normal limits  SALICYLATE LEVEL - Abnormal; Notable for the following components:   Salicylate Lvl <7.0 (*)    All other components within normal limits  ACETAMINOPHEN LEVEL - Abnormal; Notable for the following  components:   Acetaminophen (Tylenol), Serum <10 (*)    All other components within normal limits  CBC WITH DIFFERENTIAL/PLATELET  ETHANOL  RAPID URINE DRUG SCREEN, HOSP PERFORMED  I-STAT BETA HCG BLOOD, ED ([facility], [facility], [facility] ONLY)  TROPONIN I (HIGH SENSITIVITY)  TROPONIN I (HIGH SENSITIVITY)    EKG None  Radiology DG Chest Portable 1 View  Result Date: 05/14/2022 CLINICAL DATA:  Chest pain EXAM: PORTABLE CHEST 1 VIEW COMPARISON:  04/21/2022 FINDINGS: The heart size and mediastinal contours are within normal limits. Both lungs are clear. The visualized skeletal structures are unremarkable. IMPRESSION: No active disease. Electronically Signed   By: Fidela Salisbury M.D.   On: 05/14/2022 22:46    Procedures Procedures    Medications Ordered in ED Medications  sodium chloride 0.9 % bolus 1,000 mL (0 mLs Intravenous Stopped 05/14/22 2337)  ondansetron (ZOFRAN) injection 4 mg (4 mg Intravenous Given 05/14/22 2214)   ED Course/ Medical Decision Making/ A&P    37 year old here for evaluation of chest pain, nausea and feeling unwell after taking a delta 8 gummy.  She denies any history of similar.  Is never illicit substances previously.  Denies any EtOH use.  On my initial evaluation patient very sleepy in room, only arouses to loud voice.  She is no clinical evidence of VTE on exam.  No back pain.  Chest pain nonexertional, nonpleuritic in nature.  Lungs clear.  Abdomen soft, nontender.  Denies intent at self harm. Will order labs and imaging  Labs and imaging personally viewed and interpreted:  EKG  without ischemic changes CBC pending BMP without significant abnormality Ethanol <10 Preg neg Trop neg Xray chest without cardiomegaly, pulm edema, PNA< pneumothorax  Patient reassessed. Denies any pain however is still significantly sleepy, difficulty staying awake.   Plan if remaining labs WNL, patient allowed to sober, tolerates PO intake plan to dc home.                           Medical Decision Making Amount and/or Complexity of Data Reviewed Independent Historian: EMS External Data Reviewed: labs, radiology, ECG and notes. Labs: ordered. Decision-making details documented in ED Course. Radiology: ordered and independent interpretation performed. Decision-making details documented in ED Course. ECG/medicine tests: ordered and independent interpretation performed. Decision-making details documented in ED Course.  Risk OTC drugs. Prescription drug management. Parenteral controlled substances. Decision regarding hospitalization. Diagnosis or treatment significantly limited by social determinants of health.          Final Clinical Impression(s) / ED Diagnoses Final diagnoses:  Chest pain, unspecified type  Substance use    Rx / DC Orders ED Discharge Orders     None         Hartley Urton A, PA-C 05/14/22 [place], [place], DO 05/19/22 [phone]

## 2022-05-14 NOTE — Discharge Instructions (Addendum)
Do not take Delta 8 gummies again  Make sure to rest and hydrate well  Follow up with Primary care provider next week  Return for new or wornseing symptoms

## 2022-05-15 LAB — CBC WITH DIFFERENTIAL/PLATELET
Abs Immature Granulocytes: 0.02 10*3/uL (ref 0.00–0.07)
Basophils Absolute: 0 10*3/uL (ref 0.0–0.1)
Basophils Relative: 0 %
Eosinophils Absolute: 0.1 10*3/uL (ref 0.0–0.5)
Eosinophils Relative: 1 %
HCT: 36.7 % (ref 36.0–46.0)
Hemoglobin: 12 g/dL (ref 12.0–15.0)
Immature Granulocytes: 0 %
Lymphocytes Relative: 12 %
Lymphs Abs: 1.2 10*3/uL (ref 0.7–4.0)
MCH: 28.4 pg (ref 26.0–34.0)
MCHC: 32.7 g/dL (ref 30.0–36.0)
MCV: 86.8 fL (ref 80.0–100.0)
Monocytes Absolute: 0.5 10*3/uL (ref 0.1–1.0)
Monocytes Relative: 5 %
Neutro Abs: 8.6 10*3/uL — ABNORMAL HIGH (ref 1.7–7.7)
Neutrophils Relative %: 82 %
Platelets: 197 10*3/uL (ref 150–400)
RBC: 4.23 MIL/uL (ref 3.87–5.11)
RDW: 12.8 % (ref 11.5–15.5)
WBC: 10.4 10*3/uL (ref 4.0–10.5)
nRBC: 0 % (ref 0.0–0.2)

## 2022-05-15 LAB — TROPONIN I (HIGH SENSITIVITY): Troponin I (High Sensitivity): <2 ng/L (ref <18)

## 2022-05-15 NOTE — Addendum Note (Signed)
Addended by: Janan Halter F on: 05/15/2022 10:46 AM   Modules accepted: Orders

## 2022-05-15 NOTE — ED Provider Notes (Signed)
Received at shift change from britni tenderly PA-C please see note for full detail    Patient medical history including hypertension, anxiety, SVT presents emerged department after eating a delta 8 gummy.  Patient states that after taking it she started to feel some heart palpitations chest pressure, felt some paresthesias on her left side, she states that this is since resolved.  She states she feels slightly dizzy, describes as if the room is spinning but denies headache change in vision paresthesias or weakness in the upper and/or lower extremities, Not endorsing chest pain shortness of breath pleuritic chest pain, denies any suicidal homicidal ideations,   Per previous provider follow-up on lab work and await patient to be at baseline and then can discharge Physical Exam  BP (!) 120/59   Pulse 76   Temp 98.2 F (36.8 C)   Resp 12   SpO2 95%   Physical Exam Vitals and nursing note reviewed.  Constitutional:      General: She is not in acute distress.    Appearance: She is not ill-appearing.  HENT:     Head: Normocephalic and atraumatic.     Nose: No congestion.  Eyes:     Extraocular Movements: Extraocular movements intact.     Conjunctiva/sclera: Conjunctivae normal.     Pupils: Pupils are equal, round, and reactive to light.  Cardiovascular:     Rate and Rhythm: Normal rate and regular rhythm.     Pulses: Normal pulses.  Pulmonary:     Effort: Pulmonary effort is normal.  Skin:    General: Skin is warm and dry.  Neurological:     Mental Status: She is alert.     Comments: Cranial nerves II through XII grossly intact no difficulty with word finding following two-step commands no unilateral weakness present.  Psychiatric:        Mood and Affect: Mood normal.     Procedures  Procedures  ED Course / MDM    Medical Decision Making Amount and/or Complexity of Data Reviewed Labs: ordered. Radiology: ordered.  Risk Prescription drug management.    Lab Tests:  I  Ordered, and personally interpreted labs.  The pertinent results include: CBC unremarkable, BMP shows glucose of 175 creatinine 1.07 calcium 8.4 acetaminophen unremarkable, ethanol unremarkable negative delta troponin i-STAT hCG negative   Imaging Studies ordered:  I ordered imaging studies including chest x-ray I independently visualized and interpreted imaging which showed negative acute findings I agree with the radiologist interpretation   Cardiac Monitoring:  The patient was maintained on a cardiac monitor.  I personally viewed and interpreted the cardiac monitored which showed an underlying rhythm of: No signs of ischemia   Medicines ordered and prescription drug management:  I ordered medication including N/A I have reviewed the patients home medicines and have made adjustments as needed  Critical Interventions:  N/A   Reevaluation:  Patient is reassessed she is resting comfortably, she is agreement with plan discharge at this time.  Consultations Obtained:  N/A    Test Considered:  N/A    Rule out I have low suspicion for ACS as history is atypical, patient has no cardiac history, EKG was sinus rhythm without signs of ischemia, patient had negative delta troponin.  Low suspicion for PE as patient denies pleuritic chest pain, shortness of breath, patient denies leg pain, no pedal edema noted on exam, patient was PERC negative.  Low suspicion for AAA or aortic dissection as history is atypical, patient has low risk factors.  Low suspicion for CVA endorsing a headache change in vision paresthesias or weakness in the upper or lower extremities, no focal deficits noted on my exam.  I doubt psychiatric emergency not endorsing any suicidal homicidal ideations she is responding appropriately does not appear to be respond to internal stimuli.   Dispostion and problem list  After consideration of the diagnostic results and the patients response to treatment, I feel that  the patent would benefit from discharge.  Chest palpitations-suspect secondary to adverse reaction to cannabis use, recommend that she discontinue this follow-up with her PCP as needed           Marcello Fennel, PA-C 05/15/22 0430    Veryl Speak, MD 05/16/22 [phone]

## 2022-05-24 ENCOUNTER — Encounter: Payer: Self-pay | Admitting: Cardiology

## 2022-05-24 ENCOUNTER — Ambulatory Visit: Payer: 59 | Attending: Cardiology | Admitting: Cardiology

## 2022-05-24 VITALS — BP 128/78 | HR 85 | Ht 69.0 in | Wt 236.0 lb

## 2022-05-24 DIAGNOSIS — I10 Essential (primary) hypertension: Secondary | ICD-10-CM

## 2022-05-24 DIAGNOSIS — I471 Supraventricular tachycardia, unspecified: Secondary | ICD-10-CM

## 2022-05-24 MED ORDER — IRBESARTAN 150 MG PO TABS: 150.0000 mg | ORAL_TABLET | Freq: Every day | ORAL | 3 refills | Status: DC

## 2022-05-24 NOTE — Patient Instructions (Signed)
Medication Instructions:   TAKE 50 MG OF ATENOLOL TWICE DAILY X 2 DAYS THEN DECREASE TO 25 MG TWICE DAILY X 2 DAYS AND THEN STOP  AFTER STOPPING ATENOLOL START IRBESARTAN 150 MG ONCE DAILY  *If you need a refill on your cardiac medications before your next appointment, please call your pharmacy*   Lab Work:  Your physician recommends that you return for lab work in: ONE WEEK-DO NOT NEED TO FAST  2ND FLOOR [address] IN THE [place]  If you have labs (blood work) drawn today and your tests are completely normal, you will receive your results only by: [place] (if you have MyChart) OR A paper copy in the mail If you have any lab test that is abnormal or we need to change your treatment, we will call you to review the results.   Follow-Up: At [facility], you and your health needs are our priority.  As part of our continuing mission to provide you with exceptional heart care, we have created designated Provider Care Teams.  These Care Teams include your primary Cardiologist (physician) and Advanced Practice Providers (APPs -  Physician Assistants and Nurse Practitioners) who all work together to provide you with the care you need, when you need it.  We recommend signing up for the patient portal called "MyChart".  Sign up information is provided on this After Visit Summary.  MyChart is used to connect with patients for Virtual Visits (Telemedicine).  Patients are able to view lab/test results, encounter notes, upcoming appointments, etc.  Non-urgent messages can be sent to your provider as well.   To learn more about what you can do with MyChart, go to [email or web address].    Your next appointment:   6 month(s)  The format for your next appointment:   In Person  Provider:   Kirk Ruths, MD

## 2022-05-26 ENCOUNTER — Encounter: Payer: Self-pay | Admitting: Cardiology

## 2022-05-26 NOTE — Telephone Encounter (Signed)
Patient returned Debra's called about the results to her sleep study.

## 2022-05-26 NOTE — Telephone Encounter (Signed)
Spoke with pt, aware of the comments from dr Claiborne Billings. She is going to have PFT's and will follow up with her medical doctor.

## 2022-05-26 NOTE — Telephone Encounter (Signed)
Left message for pt to call, she does not qualify for CPAP because she only had issues when in REM sleep. She does not qualify for oxygen to sleep because she only desaturated for 1 minute. We can repeat studies in 6 months if continues to have symptoms. Dr Stanford Breed is aware.

## 2022-05-26 NOTE — Telephone Encounter (Signed)
This encounter was created in error - please disregard.

## 2022-06-12 ENCOUNTER — Emergency Department (HOSPITAL_BASED_OUTPATIENT_CLINIC_OR_DEPARTMENT_OTHER): Payer: 59

## 2022-06-12 ENCOUNTER — Encounter (HOSPITAL_BASED_OUTPATIENT_CLINIC_OR_DEPARTMENT_OTHER): Payer: Self-pay | Admitting: Emergency Medicine

## 2022-06-12 ENCOUNTER — Other Ambulatory Visit: Payer: Self-pay

## 2022-06-12 DIAGNOSIS — Z5321 Procedure and treatment not carried out due to patient leaving prior to being seen by health care provider: Secondary | ICD-10-CM | POA: Diagnosis not present

## 2022-06-12 DIAGNOSIS — R110 Nausea: Secondary | ICD-10-CM | POA: Insufficient documentation

## 2022-06-12 DIAGNOSIS — R079 Chest pain, unspecified: Secondary | ICD-10-CM | POA: Insufficient documentation

## 2022-06-12 DIAGNOSIS — R42 Dizziness and giddiness: Secondary | ICD-10-CM | POA: Insufficient documentation

## 2022-06-12 DIAGNOSIS — M79602 Pain in left arm: Secondary | ICD-10-CM | POA: Diagnosis not present

## 2022-06-12 LAB — CBC
HCT: 41 % (ref 36.0–46.0)
Hemoglobin: 13.8 g/dL (ref 12.0–15.0)
MCH: 28.3 pg (ref 26.0–34.0)
MCHC: 33.7 g/dL (ref 30.0–36.0)
MCV: 84 fL (ref 80.0–100.0)
Platelets: 304 10*3/uL (ref 150–400)
RBC: 4.88 MIL/uL (ref 3.87–5.11)
RDW: 13.1 % (ref 11.5–15.5)
WBC: 9.4 10*3/uL (ref 4.0–10.5)
nRBC: 0 % (ref 0.0–0.2)

## 2022-06-12 LAB — BASIC METABOLIC PANEL
Anion gap: 8 (ref 5–15)
BUN: 13 mg/dL (ref 6–20)
CO2: 28 mmol/L (ref 22–32)
Calcium: 9.3 mg/dL (ref 8.9–10.3)
Chloride: 103 mmol/L (ref 98–111)
Creatinine, Ser: 1.07 mg/dL — ABNORMAL HIGH (ref 0.44–1.00)
GFR, Estimated: >60 mL/min (ref >60)
Glucose, Bld: 115 mg/dL — ABNORMAL HIGH (ref 70–99)
Potassium: 3.5 mmol/L (ref 3.5–5.1)
Sodium: 139 mmol/L (ref 135–145)

## 2022-06-12 LAB — TROPONIN I (HIGH SENSITIVITY): Troponin I (High Sensitivity): <2 ng/L (ref <18)

## 2022-06-12 NOTE — ED Triage Notes (Addendum)
Patient presents to ED via POV. Patient reports while driving tonight she experienced chest pain. Patient notes her left arm was hurting her earlier in the evening. Patient reports chest pain radiates into left side of jaw. Hx of ablation in August. During episode patient reports dizziness, palpations and nausea.

## 2022-06-13 ENCOUNTER — Encounter: Payer: Self-pay | Admitting: Cardiology

## 2022-06-13 ENCOUNTER — Emergency Department (HOSPITAL_BASED_OUTPATIENT_CLINIC_OR_DEPARTMENT_OTHER)
Admission: EM | Admit: 2022-06-13 | Discharge: 2022-06-13 | Discharge status: Against Medical Advice | Payer: 59 | Attending: Emergency Medicine | Admitting: Emergency Medicine

## 2022-06-13 ENCOUNTER — Ambulatory Visit (INDEPENDENT_AMBULATORY_CARE_PROVIDER_SITE_OTHER): Payer: 59 | Admitting: Internal Medicine

## 2022-06-13 DIAGNOSIS — I471 Supraventricular tachycardia, unspecified: Secondary | ICD-10-CM

## 2022-06-13 DIAGNOSIS — I10 Essential (primary) hypertension: Secondary | ICD-10-CM

## 2022-06-13 LAB — PULMONARY FUNCTION TEST
DL/VA % pred: 113 %
DL/VA: 4.88 ml/min/mmHg/L
DLCO cor % pred: 108 %
DLCO cor: 28.02 ml/min/mmHg
DLCO unc % pred: 103 %
DLCO unc: 26.73 ml/min/mmHg
FEF 25-75 Post: 4.59 L/sec
FEF 25-75 Pre: 3.84 L/sec
FEF2575-%Change-Post: 19 %
FEF2575-%Pred-Post: 131 %
FEF2575-%Pred-Pre: 109 %
FEV1-%Change-Post: 5 %
FEV1-%Pred-Post: 105 %
FEV1-%Pred-Pre: 100 %
FEV1-Post: 3.76 L
FEV1-Pre: 3.55 L
FEV1FVC-%Change-Post: 5 %
FEV1FVC-%Pred-Pre: 100 %
FEV6-%Change-Post: 0 %
FEV6-%Pred-Post: 100 %
FEV6-%Pred-Pre: 99 %
FEV6-Post: 4.31 L
FEV6-Pre: 4.27 L
FEV6FVC-%Change-Post: 0 %
FEV6FVC-%Pred-Post: 101 %
FEV6FVC-%Pred-Pre: 101 %
FVC-%Change-Post: 0 %
FVC-%Pred-Post: 98 %
FVC-%Pred-Pre: 98 %
FVC-Post: 4.31 L
FVC-Pre: 4.28 L
Post FEV1/FVC ratio: 87 %
Post FEV6/FVC ratio: 100 %
Pre FEV1/FVC ratio: 83 %
Pre FEV6/FVC Ratio: 100 %
RV % pred: 132 %
RV: 2.38 L
TLC % pred: 116 %
TLC: 6.75 L

## 2022-06-13 NOTE — Progress Notes (Signed)
Full PFT Performed Today  

## 2022-06-13 NOTE — Patient Instructions (Signed)
Full PFT Performed Today  

## 2022-06-16 ENCOUNTER — Telehealth: Payer: Self-pay

## 2022-06-16 NOTE — Telephone Encounter (Signed)
-----   Message from Marinus Maw, MD sent at 06/14/2022  9:56 PM EST ----- Normal lung function

## 2022-06-16 NOTE — Telephone Encounter (Signed)
-----   Message from Gregg W Taylor, MD sent at 06/14/2022  9:56 PM EST ----- Normal lung function 

## 2022-06-16 NOTE — Telephone Encounter (Signed)
Pt called to share PFT results read by Dr. Ladona Ridgel.   Pt made aware PFT results were WNL.  Pt stated she had a recent ER visit on 11/7 after attending her son's game. Pt stated she felt short of breath and dizzy.  Pt states she was told left lung looked suspicious, and more testing was required.   She was told to wait an ER waiting room with coughing patients.  Pt states she was afraid of catching Covid, so she left the ER.    Pt advised to follow up with her PCP, and make them aware of what happened at the ER.  Pt advised to consult her PCP to ask questions / possibly seek assessment from Pulmonology?  Pt will f/u with her PCP.

## 2022-07-04 ENCOUNTER — Encounter: Payer: Self-pay | Admitting: *Deleted

## 2022-07-19 ENCOUNTER — Other Ambulatory Visit: Payer: Self-pay

## 2022-07-19 ENCOUNTER — Emergency Department (HOSPITAL_BASED_OUTPATIENT_CLINIC_OR_DEPARTMENT_OTHER): Payer: 59

## 2022-07-19 ENCOUNTER — Emergency Department (HOSPITAL_BASED_OUTPATIENT_CLINIC_OR_DEPARTMENT_OTHER)
Admission: EM | Admit: 2022-07-19 | Discharge: 2022-07-19 | Disposition: A | Discharge status: Home / Self Care | Payer: 59 | Attending: Emergency Medicine | Admitting: Emergency Medicine

## 2022-07-19 ENCOUNTER — Encounter (HOSPITAL_BASED_OUTPATIENT_CLINIC_OR_DEPARTMENT_OTHER): Payer: Self-pay | Admitting: Pediatrics

## 2022-07-19 DIAGNOSIS — I10 Essential (primary) hypertension: Secondary | ICD-10-CM | POA: Insufficient documentation

## 2022-07-19 DIAGNOSIS — J45909 Unspecified asthma, uncomplicated: Secondary | ICD-10-CM | POA: Insufficient documentation

## 2022-07-19 DIAGNOSIS — Z79890 Hormone replacement therapy (postmenopausal): Secondary | ICD-10-CM | POA: Insufficient documentation

## 2022-07-19 DIAGNOSIS — Z7951 Long term (current) use of inhaled steroids: Secondary | ICD-10-CM | POA: Diagnosis not present

## 2022-07-19 DIAGNOSIS — E039 Hypothyroidism, unspecified: Secondary | ICD-10-CM | POA: Diagnosis not present

## 2022-07-19 DIAGNOSIS — Z1152 Encounter for screening for COVID-19: Secondary | ICD-10-CM | POA: Insufficient documentation

## 2022-07-19 DIAGNOSIS — R110 Nausea: Secondary | ICD-10-CM | POA: Insufficient documentation

## 2022-07-19 DIAGNOSIS — E860 Dehydration: Secondary | ICD-10-CM | POA: Diagnosis not present

## 2022-07-19 DIAGNOSIS — I471 Supraventricular tachycardia, unspecified: Secondary | ICD-10-CM | POA: Insufficient documentation

## 2022-07-19 DIAGNOSIS — R002 Palpitations: Secondary | ICD-10-CM | POA: Diagnosis present

## 2022-07-19 LAB — BASIC METABOLIC PANEL
Anion gap: 9 (ref 5–15)
BUN: 11 mg/dL (ref 6–20)
CO2: 25 mmol/L (ref 22–32)
Calcium: 9.4 mg/dL (ref 8.9–10.3)
Chloride: 103 mmol/L (ref 98–111)
Creatinine, Ser: 0.99 mg/dL (ref 0.44–1.00)
GFR, Estimated: >60 mL/min (ref >60)
Glucose, Bld: 110 mg/dL — ABNORMAL HIGH (ref 70–99)
Potassium: 3.5 mmol/L (ref 3.5–5.1)
Sodium: 137 mmol/L (ref 135–145)

## 2022-07-19 LAB — CBC
HCT: 40.4 % (ref 36.0–46.0)
Hemoglobin: 14 g/dL (ref 12.0–15.0)
MCH: 28.8 pg (ref 26.0–34.0)
MCHC: 34.7 g/dL (ref 30.0–36.0)
MCV: 83.1 fL (ref 80.0–100.0)
Platelets: 324 10*3/uL (ref 150–400)
RBC: 4.86 MIL/uL (ref 3.87–5.11)
RDW: 13.1 % (ref 11.5–15.5)
WBC: 12.6 10*3/uL — ABNORMAL HIGH (ref 4.0–10.5)
nRBC: 0 % (ref 0.0–0.2)

## 2022-07-19 LAB — RESP PANEL BY RT-PCR (RSV, FLU A&B, COVID)  RVPGX2
Influenza A by PCR: NEGATIVE
Influenza B by PCR: NEGATIVE
Resp Syncytial Virus by PCR: NEGATIVE
SARS Coronavirus 2 by RT PCR: NEGATIVE

## 2022-07-19 LAB — HEPATIC FUNCTION PANEL
ALT: 30 U/L (ref 0–44)
AST: 30 U/L (ref 15–41)
Albumin: 4.4 g/dL (ref 3.5–5.0)
Alkaline Phosphatase: 45 U/L (ref 38–126)
Bilirubin, Direct: 0.1 mg/dL (ref 0.0–0.2)
Indirect Bilirubin: 0.4 mg/dL (ref 0.3–0.9)
Total Bilirubin: 0.5 mg/dL (ref 0.3–1.2)
Total Protein: 7.8 g/dL (ref 6.5–8.1)

## 2022-07-19 LAB — TROPONIN I (HIGH SENSITIVITY)
Troponin I (High Sensitivity): 2 ng/L (ref <18)
Troponin I (High Sensitivity): 3 ng/L (ref <18)

## 2022-07-19 LAB — PREGNANCY, URINE: Preg Test, Ur: NEGATIVE

## 2022-07-19 LAB — LACTIC ACID, PLASMA
Lactic Acid, Venous: 1.9 mmol/L (ref 0.5–1.9)
Lactic Acid, Venous: 1.2 mmol/L (ref 0.5–1.9)

## 2022-07-19 LAB — LIPASE, BLOOD: Lipase: 30 U/L (ref 11–51)

## 2022-07-19 LAB — D-DIMER, QUANTITATIVE: D-Dimer, Quant: 0.28 ug/mL-FEU (ref 0.00–0.50)

## 2022-07-19 MED ORDER — SODIUM CHLORIDE 0.9 % IV BOLUS: 1000.0000 mL | Freq: Once | INTRAVENOUS | Status: DC

## 2022-07-19 MED ORDER — SODIUM CHLORIDE 0.9 % IV SOLN: Freq: Once | INTRAVENOUS | Status: AC

## 2022-07-19 MED ORDER — SODIUM CHLORIDE 0.9 % IV BOLUS
1000.0000 mL | Freq: Once | INTRAVENOUS | Status: AC
  Administered 2022-07-19: 1000 mL via INTRAVENOUS

## 2022-07-19 MED ORDER — ADENOSINE 6 MG/2ML IV SOLN
INTRAVENOUS | Status: AC
  Filled 2022-07-19: qty 6

## 2022-07-19 MED ORDER — ONDANSETRON HCL 4 MG PO TABS: 4.0000 mg | ORAL_TABLET | Freq: Three times a day (TID) | ORAL | 0 refills | Status: DC | PRN

## 2022-07-19 NOTE — ED Notes (Signed)
Dr. Tegeler at bedside.  

## 2022-07-19 NOTE — ED Provider Notes (Signed)
[facility] EMERGENCY DEPARTMENT Provider Note   CSN: [id number] Arrival date & time: 07/19/22  1519     History  Chief Complaint  Patient presents with   Palpitations    Sheri Johnson is a 36 y.o. female.  The history is provided by the patient and medical records. No language interpreter was used.  Palpitations Palpitations quality:  Fast Onset quality:  Sudden Duration:  15 minutes Timing:  Constant Progression:  Unchanged Chronicity:  Recurrent Relieved by:  Nothing Worsened by:  Nothing Ineffective treatments:  None tried Associated symptoms: chest pain, chest pressure, cough, malaise/fatigue, nausea and shortness of breath   Associated symptoms: no back pain, no lower extremity edema, no numbness, no vomiting and no weakness   Risk factors: no hx of atrial fibrillation and no hx of PE        Home Medications Prior to Admission medications   Medication Sig Start Date End Date Taking? Authorizing Provider  albuterol (VENTOLIN HFA) 108 (90 Base) MCG/ACT inhaler Inhale 2 puffs into the lungs every 6 (six) hours as needed for wheezing or shortness of breath.    [provider]  Ascorbic Acid (VITAMIN C) 1000 MG tablet Take 1,000 mg by mouth daily.    [provider]  b complex vitamins capsule Take 1 capsule by mouth daily.    [provider]  cetirizine (ZYRTEC) 10 MG tablet Take 10 mg by mouth 2 (two) times daily.    [provider]  clobetasol (TEMOVATE) 0.05 % external solution Apply 1 Application topically at bedtime. Apply to scalp 02/27/22   [provider]  clonazePAM (KLONOPIN) 0.5 MG tablet Take 0.5-1 mg by mouth 2 (two) times daily as needed for anxiety.    [provider]  Coenzyme Q10 (COQ10) 100 MG CAPS Take 100 mg by mouth daily.    [provider]  cyclobenzaprine (FLEXERIL) 10 MG tablet Take 10 mg by mouth in the morning and at bedtime. 07/07/21   [provider]   Digestive Enzymes CAPS Take 1 capsule by mouth 2 (two) times daily before a meal.    [provider]  DULoxetine (CYMBALTA) 20 MG capsule Take 1 capsule (20 mg total) by mouth daily. 04/19/22   Ocie Doyne, MD  fluticasone (FLONASE) 50 MCG/ACT nasal spray Place 1 spray into both nostrils daily as needed for allergies or rhinitis.    [provider]  fluticasone (FLOVENT HFA) 44 MCG/ACT inhaler Inhale 1 puff into the lungs 2 (two) times daily.    [provider]  ibuprofen (ADVIL) 800 MG tablet Take 800 mg by mouth every 8 (eight) hours as needed for moderate pain.    [provider]  irbesartan (AVAPRO) 150 MG tablet Take 1 tablet (150 mg total) by mouth daily. 05/24/22   Lewayne Bunting, MD  levothyroxine (SYNTHROID) 88 MCG tablet Take 88 mcg by mouth daily before breakfast.    [provider]  Magnesium 400 MG CAPS Take 400 mg by mouth daily as needed (Low Magnesium).    [provider]  norethindrone (MICRONOR) 0.35 MG tablet Take 1 tablet by mouth daily. 04/21/22   [provider]  OVER THE COUNTER MEDICATION Take 2 tablets by mouth daily. Medication: Beet Root Supplement    [provider]  OVER THE COUNTER MEDICATION Take 2 capsules by mouth at bedtime. Medication: Clear Lungs    [provider]  pantoprazole (PROTONIX) 40 MG tablet Take 40 mg by mouth daily. 02/21/22  [provider]  potassium chloride SA (KLOR-CON M) 20 MEQ tablet Take 1 tablet (20 mEq total) by mouth daily. 12/14/21   Tereso Newcomer T, PA-C  Probiotic Product (PROBIOTIC DAILY PO) Take 2 each by mouth at bedtime.    [provider]  TURMERIC CURCUMIN PO Take 2 capsules by mouth daily.    [provider]      Allergies    Metoprolol, Cipro [ciprofloxacin hcl], and Prednisone    Review of Systems   Review of Systems  Constitutional:  Positive for chills, fatigue and malaise/fatigue. Negative for fever.  HENT:   Positive for congestion.   Eyes:  Negative for visual disturbance.  Respiratory:  Positive for cough, chest tightness and shortness of breath. Negative for wheezing and stridor.   Cardiovascular:  Positive for chest pain and palpitations. Negative for leg swelling.  Gastrointestinal:  Positive for abdominal pain, diarrhea and nausea. Negative for constipation and vomiting.  Genitourinary:  Negative for dysuria and flank pain.  Musculoskeletal:  Negative for back pain, neck pain and neck stiffness.  Skin:  Negative for rash and wound.  Neurological:  Positive for light-headedness. Negative for seizures, weakness, numbness and headaches.  Psychiatric/Behavioral:  Negative for agitation.   All other systems reviewed and are negative.   Physical Exam Updated Vital Signs BP (!) 150/86   Pulse (!) 103   Temp 99.1 F (37.3 C) (Oral)   Resp 17   Ht 5\' 9"  (1.753 m)   Wt 108.9 kg   LMP 07/06/2022   SpO2 99%   BMI 35.44 kg/m  Physical Exam Vitals and nursing note reviewed.  Constitutional:      General: She is not in acute distress.    Appearance: She is well-developed. She is ill-appearing. She is not toxic-appearing or diaphoretic.  HENT:     Head: Normocephalic and atraumatic.     Nose: Congestion present. No rhinorrhea.     Mouth/Throat:     Mouth: Mucous membranes are dry.  Eyes:     Extraocular Movements: Extraocular movements intact.     Conjunctiva/sclera: Conjunctivae normal.     Pupils: Pupils are equal, round, and reactive to light.  Cardiovascular:     Rate and Rhythm: Regular rhythm. Tachycardia present.     Heart sounds: No murmur heard. Pulmonary:     Effort: Pulmonary effort is normal. No respiratory distress.     Breath sounds: Rhonchi present. No wheezing or rales.  Chest:     Chest wall: No tenderness.  Abdominal:     General: Abdomen is flat.     Palpations: Abdomen is soft.     Tenderness: There is no abdominal tenderness.  Musculoskeletal:         General: No swelling or tenderness.     Cervical back: Neck supple. No tenderness.     Right lower leg: No edema.     Left lower leg: No edema.  Skin:    General: Skin is warm and dry.     Capillary Refill: Capillary refill takes less than 2 seconds.     Findings: Erythema present.  Neurological:     General: No focal deficit present.     Mental Status: She is alert.     Sensory: No sensory deficit.     Motor: No weakness.  Psychiatric:        Mood and Affect: Mood normal.     ED Results / Procedures / Treatments   Labs (all labs ordered are listed, but  only abnormal results are displayed) Labs Reviewed  BASIC METABOLIC PANEL - Abnormal; Notable for the following components:      Result Value   Glucose, Bld 110 (*)    All other components within normal limits  CBC - Abnormal; Notable for the following components:   WBC 12.6 (*)    All other components within normal limits  RESP PANEL BY RT-PCR (RSV, FLU A&B, COVID)  RVPGX2  PREGNANCY, URINE  D-DIMER, QUANTITATIVE  LIPASE, BLOOD  HEPATIC FUNCTION PANEL  LACTIC ACID, PLASMA  LACTIC ACID, PLASMA  TROPONIN I (HIGH SENSITIVITY)  TROPONIN I (HIGH SENSITIVITY)    EKG EKG Interpretation  Date/Time:  Wednesday July 19 2022 15:26:48 EST Ventricular Rate:  104 PR Interval:  134 QRS Duration: 89 QT Interval:  335 QTC Calculation: 441 R Axis:   67 Text Interpretation: Sinus tachycardia when compared to prior, slower rate and appears sinus. No STEMI Confirmed by Theda Belfast ([id number]) on 07/19/2022 3:36:36 PM  Radiology DG Chest Portable 1 View  Result Date: 07/19/2022 CLINICAL DATA:  Chest pain, supraventricular tachycardia, arm numbness and palpitations. EXAM: PORTABLE CHEST 1 VIEW COMPARISON:  06/12/2022. FINDINGS: Trachea is midline. Heart size stable. Lungs are clear. No pleural fluid. IMPRESSION: No acute findings. Electronically Signed   By: Leanna Battles M.D.   On: 07/19/2022 16:07     Procedures .Cardioversion  Date/Time: 07/19/2022 3:57 PM  Performed by: Heide Scales, MD Authorized by: Heide Scales, MD   Consent:    Consent obtained:  Verbal   Consent given by:  Patient   Alternatives discussed:  No treatment Universal protocol:    Immediately prior to procedure a time out was called: yes     Patient identity confirmed:  Verbally with patient Pre-procedure details:    Cardioversion basis:  Emergent   Rhythm:  Supraventricular tachycardia Attempt one:    Cardioversion mode attempt one: vagal maneuver.   Shock outcome:  Conversion to normal sinus rhythm Attempt two:    Cardioversion mode attempt two: vagal maneuver.   Shock outcome:  Conversion to normal sinus rhythm (conversion to sinus tachycardia) Post-procedure details:    Patient status:  Awake   Patient tolerance of procedure:  Tolerated well, no immediate complications Comments:     Vagal cardioversion attempt x 2 from SVT that was successful with vagal maneuver.      CRITICAL CARE Performed by: Canary Brim Julea Hutto Total critical care time: 35 minutes Critical care time was exclusive of separately billable procedures and treating other patients. Critical care was necessary to treat or prevent imminent or life-threatening deterioration. Critical care was time spent personally by me on the following activities: development of treatment plan with patient and/or surrogate as well as nursing, discussions with consultants, evaluation of patient's response to treatment, examination of patient, obtaining history from patient or surrogate, ordering and performing treatments and interventions, ordering and review of laboratory studies, ordering and review of radiographic studies, pulse oximetry and re-evaluation of patient's condition.   Medications Ordered in ED Medications  adenosine (ADENOCARD) 6 MG/2ML injection (  Return to [facility] 07/19/22 1824)  sodium chloride 0.9 % bolus  1,000 mL (0 mLs Intravenous Stopped 07/19/22 1717)  0.9 %  sodium chloride infusion (0 mLs Intravenous Stopped 07/19/22 2018)    ED Course/ Medical Decision Making/ A&P                           Medical Decision Making Amount and/or  Complexity of Data Reviewed Labs: ordered. Radiology: ordered.  Risk Prescription drug management.    Felipe DroneLaura Storbeck is a 36 y.o. female with a past medical history significant for hypertension, hypothyroidism, asthma, anxiety, appendectomy, and previous SVT status post SVT ablation 4 months ago who presents for palpitations, chest pressure, shortness of breath, lightheadedness, and overall illness.  Patient reports that she has had some GI symptoms for the last few days with some decreased oral intake, nausea, green stools, fatigue, chills, and malaise.  She reports she may have been exposed to some with norovirus.  She says she is having some pain in her left chest and left upper abdomen is associated with her nausea.  She says that whenever she has SVT or cardiac problems she gets very flushed and red in the face and chest.  She otherwise denies any new exposures to medications, foods, soaps, or other allergic items.  She reports also chronically has pain in her left arm that she has had workup with orthopedics and a physical therapist.  She says that today about 20 minutes prior to arrival she had onset of fast palpitations, lightheadedness, and chest pain and felt similar to when she has had SVT in the past.  She reports she has not had any episodes since her previous ablation several months ago.  She reports the pain is moderate.  She does say that her chest pressure is worse with deep breathing and she has any shortness of breath.  She also has some mild cough.  She reports chronic pain in which she is currently getting worked up for lupus and other all inflammatory diseases.  On exam, lungs had some coarse breath sounds but no significant wheezing.  Chest  was nontender.  I do not appreciate a murmur.  Abdomen was nontender on exam and I did hear bowel sounds.  Legs nontender nonedematous and good pulses in upper extremities.  She does have a erythematous rash on her neck going up into her face which she reports is chronic when she has other things going on.  Initial telemetry and EKG appears to show SVT with a rate around 150.  Vagal maneuver was attempted initially and heart rate dropped about 110 and briefly appeared to show sinus rhythm versus atrial flutter.  Patient then sat up and it went back to around 150 in the arrhythmia again.  A second vagal maneuver was attempted and was held for a much longer period and her heart rate appeared to go in a sinus rhythm settling down between 110 and 120.  She reports that the palpitations and chest pain have improved.  She started to feel better.  Fluids were started.  Clinically with the GI symptoms, I do suspect she was dehydrated and this may have led to an episode of SVT.  We will give her fluids and get screening labs.  Due to the pleuritic chest pain and tachycardia and shortness of breath, get D-dimer and cardiac enzymes.  Will get chest x-ray.  Will check for other viral URI.  Anticipate reassessment after workup to determine decision.  She is not hypoxic and is looking better now that she her heart rate has slowed down.       7:02 PM Workup began to return.  Lactic acid negative x 2.  Troponin negative x 2.  Not pregnant.  Lipase normal.  Hepatic function normal and metabolic panel shows normal kidney function and electrolytes.  CBC shows mild leukocytosis but D-dimer is negative.  No  anemia.  Chest x-ray shows no pneumonia and EKG now shows a sinus rhythm.  Heart rate is now around 100 and she is feeling much better.  She denies any chest pain or palpitations and does think she was dehydrated in setting of likely viral gastroenteritis that she was exposed to.  We had a shared decision-making  conversation and given her report that she does have a history of diverticulosis and is having some left lower quadrant abdominal pain now, we offered CT imaging to rule out acute diverticulitis but she does not want it at this time.  She says that she would rather follow-up with the PCP and GI team and if any symptoms were to change or worsen she would return for likely CT image to rule out diverticulitis.  Patient will have a gentle p.o. challenge now and will get a prescription for nausea medicine.  Anticipate discharge if she passes p.o. challenge.  Patient passed p.o. challenge and is feeling well still.  She would like to go home and heart rate is under 100.  She understands the risks of being discharged with possible diagnosed diverticulitis and knows to return if symptoms change or worsen.  Will give her prescription for nausea medicine and work note.  She understands and was discharged in good condition.        Final Clinical Impression(s) / ED Diagnoses Final diagnoses:  Dehydration  SVT (supraventricular tachycardia)  Nausea    Rx / DC Orders ED Discharge Orders          Ordered    ondansetron (ZOFRAN) 4 MG tablet  Every 8 hours PRN        07/19/22 2011           Clinical Impression: 1. Dehydration   2. SVT (supraventricular tachycardia)   3. Nausea     Disposition: Discharge  Condition: Good  I have discussed the results, Dx and Tx plan with the pt(& family if present). He/she/they expressed understanding and agree(s) with the plan. Discharge instructions discussed at great length. Strict return precautions discussed and pt &/or family have verbalized understanding of the instructions. No further questions at time of discharge.    New Prescriptions   ONDANSETRON (ZOFRAN) 4 MG TABLET    Take 1 tablet (4 mg total) by mouth every 8 (eight) hours as needed for nausea or vomiting.    Follow Up: [facility] [address] [place] [address] [phone]     [facility] [place] [facility]Y [facility]PARTMENT [address] [id number] [address] [place] [address] [phone]       Burnis Halling, Canary Brim, MD 07/19/22 2330

## 2022-07-19 NOTE — Discharge Instructions (Signed)
Your history, exam and workup today revealed likely SVT in the setting of dehydration from the GI symptoms you have been having.  We were able to get you out of SVT with the 2 vagal maneuvers we performed and then you prove stability for over 4 and half hours in sinus rhythm.  We gave you fluids and nausea medicine and symptoms improved.  Your workup was otherwise reassuring as we discussed.  You did have an elevated white blood cell count which is likely related to the suspected viral infection.  We did discuss the possibility of getting a CT scan to rule out acute diverticulitis given the development of abdominal discomfort however you did not want to do that at this time.  Given your stability and well appearance we feel this is reasonable.  Please use the nausea medicine to maintain hydration at home and follow-up with your primary doctor.  If you develop worsened abdominal pain especially in the left lower quadrant or start feeling worse, please consider returning to the nearest emergency department for further evaluation likely including a CT scan of the abdomen pelvis to look for diverticulitis.

## 2022-07-19 NOTE — ED Triage Notes (Signed)
Reported hx of SVT; c/o arm numbness and palpitations. Stated felt it earlier while working at home. Reports has some abdominal pressure lately and have not eaten much, along with some nausea and dark green stool.

## 2022-07-24 ENCOUNTER — Encounter: Payer: Self-pay | Admitting: Cardiology

## 2022-07-27 ENCOUNTER — Encounter: Payer: Self-pay | Admitting: Physician Assistant

## 2022-07-27 ENCOUNTER — Ambulatory Visit: Payer: 59 | Attending: Physician Assistant | Admitting: Physician Assistant

## 2022-07-27 VITALS — BP 110/80 | HR 74 | Ht 69.0 in | Wt 245.4 lb

## 2022-07-27 DIAGNOSIS — I471 Supraventricular tachycardia, unspecified: Secondary | ICD-10-CM | POA: Diagnosis not present

## 2022-07-27 DIAGNOSIS — R002 Palpitations: Secondary | ICD-10-CM | POA: Diagnosis not present

## 2022-07-27 MED ORDER — ATENOLOL 25 MG PO TABS: 25.0000 mg | ORAL_TABLET | Freq: Two times a day (BID) | ORAL | 3 refills | Status: DC

## 2022-07-27 NOTE — Progress Notes (Signed)
Cardiology Office Note:    Date:  07/29/2022   ID:  Sheri Johnson, DOB 10-02-1985, MRN [id number]  PCP:  [facility]   [facility] Providers Cardiologist:  Olga Millers, MD Electrophysiologist:  Lewayne Bunting, MD     Referring MD: [facility]   Chief Complaint  Patient presents with   Follow-up    Seen for Dr. Jens Som    History of Present Illness:    Sheri Johnson is a 36 y.o. female with a hx of SVT, hypertension, hypothyroidism and anxiety.  Patient had COVID in January 2022 and was seen for palpitation in June 2022.  Patient was in SVT with heart rate over 200 with diffuse ST depression.  EMS terminated arrhythmia with adenosine.  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  Echocardiogram obtained in June 2023 showed normal EF.  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  Coronary CT in September 2023 showed a coronary calcium score of 0 with no CAD, dilated main pulmonary artery at 31 mm suggestive of pulmonary hypertension.  Patient was last seen by Dr. Jens Som in October 2023 at which time she had an episode of elevated heart rate after exercising.  Dr. Jens Som recommended gradually weaning off of atenolol.  She was told to decrease the atenolol to 50 mg daily for 2 days then 25 mg daily for 2 days then discontinue after that.  She was also instructed to take atenolol 50 mg on as-needed basis for recurrent palpitation.  Patient presented to the ED on 06/12/2022 complaining of chest pain rating continued to the jaw and left arm pain while driving.  She also had dizziness, palpitation and nausea during the episode.  Unfortunately, she left before seeing.  PFT obtained on 11//02/2022 was normal.  She returned back to the ED on 07/19/2022 with complaint of arm numbness and the palpitation.  EKG showed sinus tachycardia with heart rate around 150s.   Although ED physician suspected the patient had SVT, however EKG showed sinus tachycardia instead.  Vagal maneuver attempted and the heart rate dropped down to the 110s and briefly appears to be showing sinus rhythm versus atrial flutter.  However heart rate quickly bounced back to 150s.  A second vagal maneuver was attempted and was held for much longer.  And she went back into normal sinus rhythm.  Prior to arrival, she had GI symptoms, therefore it was suspected the patient was dehydrated and that this led to episode of sinus tachycardia.  Patient presents today for further evaluation.  Her primary concern is she keeps having episodes of shortness of breath, dizziness with exertion and fatigue.  When she does feel dizzy, her blood pressure is always normal, however her heart rate jumps up fairly quickly with any activity.  She takes her atenolol 50 mg at night before she goes to sleep.  On the following day after finishing work, she will go to the gym and that is when her symptoms started.  I recommended splitting her atenolol to 25 mg twice a day dosing to give her more coverage.  In the future, if her symptoms continue to persist, we can consider a heart monitor and repeat limited echocardiogram to make sure she does not have significant pericardial effusion.  She can follow-up in 2 to 3 months.  The case was discussed with Dr. Jens Som.   Past Medical History:  Diagnosis Date   Abortion 11/14/2021  Anxiety    Asthma    Frequent headaches    Hypertension    Hypothyroid    SVT (supraventricular tachycardia)     Past Surgical History:  Procedure Laterality Date   CESAREAN SECTION     x2   LAPAROSCOPIC APPENDECTOMY N/A 12/14/2016   Procedure: APPENDECTOMY LAPAROSCOPIC;  Surgeon: Griselda Mineroth, Paul III, MD;  [facility];  Service: General;  Laterality: N/A;   SVT ABLATION N/A 03/31/2022   Procedure: SVT ABLATION;  Surgeon: Marinus Mawaylor, Gregg W, MD;  Location: [facility] INVASIVE CV LAB;  Service:  Cardiovascular;  Laterality: N/A;    Current Medications: Current Meds  Medication Sig   albuterol (VENTOLIN HFA) 108 (90 Base) MCG/ACT inhaler Inhale 2 puffs into the lungs every 6 (six) hours as needed for wheezing or shortness of breath.   ALPRAZolam (XANAX XR) 0.5 MG 24 hr tablet Take by mouth.   Ascorbic Acid (VITAMIN C) 1000 MG tablet Take 1,000 mg by mouth daily.   atenolol (TENORMIN) 25 MG tablet Take 1 tablet (25 mg total) by mouth 2 (two) times daily.   b complex vitamins capsule Take 1 capsule by mouth daily.   cetirizine (ZYRTEC) 10 MG tablet Take 10 mg by mouth 2 (two) times daily.   clobetasol (TEMOVATE) 0.05 % external solution Apply 1 Application topically at bedtime. Apply to scalp   cyclobenzaprine (FLEXERIL) 10 MG tablet Take 10 mg by mouth in the morning and at bedtime.   Digestive Enzymes CAPS Take 1 capsule by mouth 2 (two) times daily before a meal.   fluticasone (FLONASE) 50 MCG/ACT nasal spray Place 1 spray into both nostrils daily as needed for allergies or rhinitis.   fluticasone (FLOVENT HFA) 44 MCG/ACT inhaler Inhale 1 puff into the lungs 2 (two) times daily.   ibuprofen (ADVIL) 800 MG tablet Take 800 mg by mouth every 8 (eight) hours as needed for moderate pain.   levothyroxine (SYNTHROID) 100 MCG tablet Take 100 mcg by mouth every morning.   Magnesium 400 MG CAPS Take 400 mg by mouth daily as needed (Low Magnesium).   OVER THE COUNTER MEDICATION Take 2 tablets by mouth daily. Medication: Beet Root Supplement   OVER THE COUNTER MEDICATION Take 2 capsules by mouth at bedtime. Medication: Clear Lungs   potassium chloride SA (KLOR-CON M) 20 MEQ tablet Take 1 tablet (20 mEq total) by mouth daily.   Probiotic Product (PROBIOTIC DAILY PO) Take 2 each by mouth at bedtime.   TURMERIC CURCUMIN PO Take 2 capsules by mouth daily.   [DISCONTINUED] atenolol (TENORMIN) 25 MG tablet Take 50 mg by mouth daily.     Allergies:   Metoprolol, Cipro [ciprofloxacin hcl], and  Prednisone   Social History   Socioeconomic History   Marital status: Single    Spouse name: Not on file   Number of children: 3   Years of education: Not on file   Highest education level: Associate degree: academic program  Occupational History   Not on file  Tobacco Use   Smoking status: Never   Smokeless tobacco: Never  Vaping Use   Vaping Use: Never used  Substance and Sexual Activity   Alcohol use: Not Currently    Comment: Rare   Drug use: Never   Sexual activity: Not on file  Other Topics Concern   Not on file  Social History Narrative   Caffeine- none   Social Determinants of Health   Financial Resource Strain: Not on file  Food Insecurity: Not on file  Transportation Needs: Not on file  Physical Activity: Not on file  Stress: Not on file  Social Connections: Not on file     Family History: The patient's family history includes Hypothyroidism in her mother.  ROS:   Please see the history of present illness.     All other systems reviewed and are negative.  EKGs/Labs/Other Studies Reviewed:    The following studies were reviewed today:  Echo 01/31/2022 1. Left ventricular ejection fraction, by estimation, is 60 to 65%. The  left ventricle has normal function. The left ventricle has no regional  wall motion abnormalities. Left ventricular diastolic parameters were  normal.   2. Right ventricular systolic function is normal. The right ventricular  size is normal. There is normal pulmonary artery systolic pressure. The  estimated right ventricular systolic pressure is 20.0 mmHg.   3. The mitral valve is normal in structure. No evidence of mitral valve  regurgitation. No evidence of mitral stenosis.   4. The aortic valve is tricuspid. Aortic valve regurgitation is not  visualized. No aortic stenosis is present.   5. The inferior vena cava is normal in size with greater than 50%  respiratory variability, suggesting right atrial pressure of 3 mmHg.     EKG:  EKG is ordered today.  The ekg ordered today demonstrates normal sinus rhythm, no significant ST-T wave changes.  Recent Labs: 01/30/2022: B Natriuretic Peptide 24.5 02/11/2022: Magnesium 1.9 04/21/2022: TSH 7.761 07/19/2022: ALT 30; BUN 11; Creatinine, Ser 0.99; Hemoglobin 14.0; Platelets 324; Potassium 3.5; Sodium 137  Recent Lipid Panel No results found for: "CHOL", "TRIG", "HDL", "CHOLHDL", "VLDL", "LDLCALC", "LDLDIRECT"   Risk Assessment/Calculations:      STOP-Bang Score:  6       Physical Exam:    VS:  BP 110/80   Pulse 74   Ht 5\' 9"  (1.753 m)   Wt 245 lb 6.4 oz (111.3 kg)   LMP 07/06/2022   SpO2 100%   BMI 36.24 kg/m        Wt Readings from Last 3 Encounters:  07/27/22 245 lb 6.4 oz (111.3 kg)  07/19/22 240 lb (108.9 kg)  06/12/22 237 lb (107.5 kg)     GEN:  Well nourished, well developed in no acute distress HEENT: Normal NECK: No JVD; No carotid bruits LYMPHATICS: No lymphadenopathy CARDIAC: RRR, no murmurs, rubs, gallops RESPIRATORY:  Clear to auscultation without rales, wheezing or rhonchi  ABDOMEN: Soft, non-tender, non-distended MUSCULOSKELETAL:  No edema; No deformity  SKIN: Warm and dry NEUROLOGIC:  Alert and oriented x 3 PSYCHIATRIC:  Normal affect   ASSESSMENT:    1. Palpitations   2. SVT (supraventricular tachycardia)    PLAN:    In order of problems listed above:  Palpitation: She has been having episodes of tachycardia palpitation especially in the afternoon after she gets off work to exercise.  She is currently on atenolol 50 mg at night before she goes to sleep, I recommended break Atenolol to 25 mg twice a day for better nighttime coverage.  If symptoms continue to worsen, we may consider limited echocardiogram and heart monitor in the future  History of SVT: Status post ablation.  Although there was suspicion that she had recurrent SVT during the recent ED visit, however EKG shows more atrial tachycardia rather than SVT.            Medication Adjustments/Labs and Tests Ordered: Current medicines are reviewed at length with the patient today.  Concerns regarding medicines are outlined above.  Orders Placed This Encounter  Procedures   EKG 12-Lead   Meds ordered this encounter  Medications   atenolol (TENORMIN) 25 MG tablet    Sig: Take 1 tablet (25 mg total) by mouth 2 (two) times daily.    Dispense:  180 tablet    Refill:  3    Patient Instructions  Medication Instructions:  Start Atenolol 25 mg ( Take 1 Tablet Twice Daily). *If you need a refill on your cardiac medications before your next appointment, please call your pharmacy*   Lab Work: No Labs If you have labs (blood work) drawn today and your tests are completely normal, you will receive your results only by: MyChart Message (if you have MyChart) OR A paper copy in the mail If you have any lab test that is abnormal or we need to change your treatment, we will call you to review the results.   Testing/Procedures: No Testing   Follow-Up: At [facility], you and your health needs are our priority.  As part of our continuing mission to provide you with exceptional heart care, we have created designated Provider Care Teams.  These Care Teams include your primary Cardiologist (physician) and Advanced Practice Providers (APPs -  Physician Assistants and Nurse Practitioners) who all work together to provide you with the care you need, when you need it.  We recommend signing up for the patient portal called "MyChart".  Sign up information is provided on this After Visit Summary.  MyChart is used to connect with patients for Virtual Visits (Telemedicine).  Patients are able to view lab/test results, encounter notes, upcoming appointments, etc.  Non-urgent messages can be sent to your provider as well.   To learn more about what you can do with MyChart, go to [email or web address].    Your next appointment:   3 month(s)  The format  for your next appointment:   In Person  Provider:   Azalee Course, PA-C      Signed, Azalee Course, [place]  07/29/2022 11:17 PM    [facility]

## 2022-07-27 NOTE — Patient Instructions (Signed)
Medication Instructions:  Start Atenolol 25 mg ( Take 1 Tablet Twice Daily). *If you need a refill on your cardiac medications before your next appointment, please call your pharmacy*   Lab Work: No Labs If you have labs (blood work) drawn today and your tests are completely normal, you will receive your results only by: MyChart Message (if you have MyChart) OR A paper copy in the mail If you have any lab test that is abnormal or we need to change your treatment, we will call you to review the results.   Testing/Procedures: No Testing   Follow-Up: At [facility], you and your health needs are our priority.  As part of our continuing mission to provide you with exceptional heart care, we have created designated Provider Care Teams.  These Care Teams include your primary Cardiologist (physician) and Advanced Practice Providers (APPs -  Physician Assistants and Nurse Practitioners) who all work together to provide you with the care you need, when you need it.  We recommend signing up for the patient portal called "MyChart".  Sign up information is provided on this After Visit Summary.  MyChart is used to connect with patients for Virtual Visits (Telemedicine).  Patients are able to view lab/test results, encounter notes, upcoming appointments, etc.  Non-urgent messages can be sent to your provider as well.   To learn more about what you can do with MyChart, go to [email or web address].    Your next appointment:   3 month(s)  The format for your next appointment:   In Person  Provider:   Azalee Course, PA-C

## 2022-07-29 ENCOUNTER — Encounter: Payer: Self-pay | Admitting: Physician Assistant

## 2022-08-03 ENCOUNTER — Emergency Department (HOSPITAL_BASED_OUTPATIENT_CLINIC_OR_DEPARTMENT_OTHER): Payer: 59

## 2022-08-03 ENCOUNTER — Other Ambulatory Visit: Payer: Self-pay

## 2022-08-03 ENCOUNTER — Encounter (HOSPITAL_BASED_OUTPATIENT_CLINIC_OR_DEPARTMENT_OTHER): Payer: Self-pay

## 2022-08-03 ENCOUNTER — Emergency Department (HOSPITAL_BASED_OUTPATIENT_CLINIC_OR_DEPARTMENT_OTHER)
Admission: EM | Admit: 2022-08-03 | Discharge: 2022-08-03 | Disposition: A | Discharge status: Home / Self Care | Payer: 59 | Attending: Emergency Medicine | Admitting: Emergency Medicine

## 2022-08-03 DIAGNOSIS — J0191 Acute recurrent sinusitis, unspecified: Secondary | ICD-10-CM | POA: Diagnosis not present

## 2022-08-03 DIAGNOSIS — R209 Unspecified disturbances of skin sensation: Secondary | ICD-10-CM | POA: Diagnosis not present

## 2022-08-03 DIAGNOSIS — R202 Paresthesia of skin: Secondary | ICD-10-CM

## 2022-08-03 DIAGNOSIS — I10 Essential (primary) hypertension: Secondary | ICD-10-CM | POA: Insufficient documentation

## 2022-08-03 DIAGNOSIS — H9312 Tinnitus, left ear: Secondary | ICD-10-CM | POA: Insufficient documentation

## 2022-08-03 DIAGNOSIS — R519 Headache, unspecified: Secondary | ICD-10-CM

## 2022-08-03 DIAGNOSIS — Z1152 Encounter for screening for COVID-19: Secondary | ICD-10-CM | POA: Diagnosis not present

## 2022-08-03 LAB — URINALYSIS, ROUTINE W REFLEX MICROSCOPIC
Bilirubin Urine: NEGATIVE
Glucose, UA: NEGATIVE mg/dL
Ketones, ur: NEGATIVE mg/dL
Leukocytes,Ua: NEGATIVE
Nitrite: NEGATIVE
Protein, ur: NEGATIVE mg/dL
Specific Gravity, Urine: 1.015 (ref 1.005–1.030)
pH: 5.5 (ref 5.0–8.0)

## 2022-08-03 LAB — BASIC METABOLIC PANEL
Anion gap: 8 (ref 5–15)
BUN: 12 mg/dL (ref 6–20)
CO2: 22 mmol/L (ref 22–32)
Calcium: 9.2 mg/dL (ref 8.9–10.3)
Chloride: 106 mmol/L (ref 98–111)
Creatinine, Ser: 0.92 mg/dL (ref 0.44–1.00)
GFR, Estimated: >60 mL/min (ref >60)
Glucose, Bld: 120 mg/dL — ABNORMAL HIGH (ref 70–99)
Potassium: 4 mmol/L (ref 3.5–5.1)
Sodium: 136 mmol/L (ref 135–145)

## 2022-08-03 LAB — RESP PANEL BY RT-PCR (RSV, FLU A&B, COVID)  RVPGX2
Influenza A by PCR: NEGATIVE
Influenza B by PCR: NEGATIVE
Resp Syncytial Virus by PCR: NEGATIVE
SARS Coronavirus 2 by RT PCR: NEGATIVE

## 2022-08-03 LAB — CBC
HCT: 42.3 % (ref 36.0–46.0)
Hemoglobin: 14.5 g/dL (ref 12.0–15.0)
MCH: 28.4 pg (ref 26.0–34.0)
MCHC: 34.3 g/dL (ref 30.0–36.0)
MCV: 82.8 fL (ref 80.0–100.0)
Platelets: 281 10*3/uL (ref 150–400)
RBC: 5.11 MIL/uL (ref 3.87–5.11)
RDW: 13.2 % (ref 11.5–15.5)
WBC: 6.5 10*3/uL (ref 4.0–10.5)
nRBC: 0 % (ref 0.0–0.2)

## 2022-08-03 LAB — URINALYSIS, MICROSCOPIC (REFLEX)

## 2022-08-03 LAB — PREGNANCY, URINE: Preg Test, Ur: NEGATIVE

## 2022-08-03 MED ORDER — MAGNESIUM SULFATE 2 GM/50ML IV SOLN
2.0000 g | Freq: Once | INTRAVENOUS | Status: AC
  Administered 2022-08-03: 2 g via INTRAVENOUS
  Filled 2022-08-03: qty 50

## 2022-08-03 MED ORDER — DEXAMETHASONE SODIUM PHOSPHATE 10 MG/ML IJ SOLN
10.0000 mg | Freq: Once | INTRAMUSCULAR | Status: AC
  Administered 2022-08-03: 10 mg
  Filled 2022-08-03: qty 1

## 2022-08-03 MED ORDER — SODIUM CHLORIDE 0.9 % IV BOLUS
1000.0000 mL | Freq: Once | INTRAVENOUS | Status: AC
  Administered 2022-08-03: 1000 mL via INTRAVENOUS

## 2022-08-03 MED ORDER — KETOROLAC TROMETHAMINE 15 MG/ML IJ SOLN
15.0000 mg | Freq: Once | INTRAMUSCULAR | Status: AC
  Administered 2022-08-03: 15 mg via INTRAVENOUS
  Filled 2022-08-03: qty 1

## 2022-08-03 MED ORDER — METOCLOPRAMIDE HCL 5 MG/ML IJ SOLN
10.0000 mg | Freq: Once | INTRAMUSCULAR | Status: AC
  Administered 2022-08-03: 10 mg via INTRAVENOUS
  Filled 2022-08-03: qty 2

## 2022-08-03 MED ORDER — AMOXICILLIN-POT CLAVULANATE 875-125 MG PO TABS: 1.0000 | ORAL_TABLET | Freq: Two times a day (BID) | ORAL | 0 refills | Status: DC

## 2022-08-03 MED ORDER — DIPHENHYDRAMINE HCL 50 MG/ML IJ SOLN
25.0000 mg | Freq: Once | INTRAMUSCULAR | Status: AC
  Administered 2022-08-03: 25 mg via INTRAVENOUS
  Filled 2022-08-03: qty 1

## 2022-08-03 NOTE — Discharge Instructions (Signed)
Your lab work and head CT today were overall reassuring.  I am glad that with treatment of your headache your symptoms are starting to improve.  I do think you likely have an underlying sinus infection, please take antibiotics as prescribed for the next week.  Make sure you are drinking plenty of fluids.  Worsening of your chronic neck issues could have triggered headache or vice versa.  Follow-up with the neurologist.

## 2022-08-03 NOTE — ED Triage Notes (Addendum)
Pt c/o L sided numbness x3 days, worsening today, states L ear feels "full," ringing, "seeing stars out of L eye." Endorses associated HA/ fullness. Hx SVT s/p ablation, states chest feels tight. Known pinched nerve in L side of neck, unsure if related. No neuro deficit noted at time of triage

## 2022-08-03 NOTE — ED Provider Notes (Signed)
[facility] EMERGENCY DEPARTMENT Provider Note   CSN: [id number] Arrival date & time: 08/03/22  1052     History  Chief Complaint  Patient presents with   Numbness        Headache    Sheri Johnson is a 36 y.o. female.  Sheri Johnson is a 36 y.o. female  with a history of hypertension, SVT, migraines, cervical radiculopathy, and anxiety, who presents to the emergency department for evaluation of headache, left-sided numbness, left ear fullness and tinnitus.  She reports that she has been having persistent headaches.  She was found to have a pinched nerve in her neck and was doing physical therapy for this which seem to be improving her headaches as well, completed this a few weeks ago and has since noticed worsening of her headaches.  Over the past 3 days she has been having a constant headache described as fullness and pressure and she reports with this she has noted numbness over the left side of her body but denies weakness she also reports that she has noticed some fullness in her left ear and face.  She has a history of chronic sinus issues and wonders if this could be contributing.  She denies any fevers.  No neck stiffness or change outside of her ongoing neck issues related to radiculopathy.  She also reports that she has intermittently experienced some mild chest tightness.  No persistent or severe chest pain or radiation of pain.  No lightheadedness or syncope.  On chart review it appears patient has experienced some similar symptoms previously in March 2023 she had a negative MRI when she was experiencing some similar left-sided symptoms.  She has since been seen by Dr. Delena Bali with neurology and is followed for SVT by Dr. Ladona Ridgel and is s/p ablation.  The history is provided by the patient and medical records.       Home Medications Prior to Admission medications   Medication Sig Start Date End Date Taking? Authorizing Provider  albuterol (VENTOLIN HFA) 108 (90  Base) MCG/ACT inhaler Inhale 2 puffs into the lungs every 6 (six) hours as needed for wheezing or shortness of breath.    [provider]  ALPRAZolam (XANAX XR) 0.5 MG 24 hr tablet Take by mouth. 07/25/22   [provider]  Ascorbic Acid (VITAMIN C) 1000 MG tablet Take 1,000 mg by mouth daily.    [provider]  atenolol (TENORMIN) 25 MG tablet Take 1 tablet (25 mg total) by mouth 2 (two) times daily. 07/27/22 10/25/22  Azalee Course, PA  b complex vitamins capsule Take 1 capsule by mouth daily.    [provider]  cetirizine (ZYRTEC) 10 MG tablet Take 10 mg by mouth 2 (two) times daily.    [provider]  clobetasol (TEMOVATE) 0.05 % external solution Apply 1 Application topically at bedtime. Apply to scalp 02/27/22   [provider]  cyclobenzaprine (FLEXERIL) 10 MG tablet Take 10 mg by mouth in the morning and at bedtime. 07/07/21   [provider]  Digestive Enzymes CAPS Take 1 capsule by mouth 2 (two) times daily before a meal.    [provider]  fluticasone (FLONASE) 50 MCG/ACT nasal spray Place 1 spray into both nostrils daily as needed for allergies or rhinitis.    [provider]  fluticasone (FLOVENT HFA) 44 MCG/ACT inhaler Inhale 1 puff into the lungs 2 (two) times daily.    [provider]  ibuprofen (ADVIL) 800 MG tablet Take  800 mg by mouth every 8 (eight) hours as needed for moderate pain.    [provider]  levothyroxine (SYNTHROID) 100 MCG tablet Take 100 mcg by mouth every morning.    [provider]  Magnesium 400 MG CAPS Take 400 mg by mouth daily as needed (Low Magnesium).    [provider]  OVER THE COUNTER MEDICATION Take 2 tablets by mouth daily. Medication: Beet Root Supplement    [provider]  OVER THE COUNTER MEDICATION Take 2 capsules by mouth at bedtime. Medication: Clear Lungs    [provider]  potassium chloride SA (KLOR-CON M) 20 MEQ  tablet Take 1 tablet (20 mEq total) by mouth daily. 12/14/21   Tereso Newcomer T, PA-C  Probiotic Product (PROBIOTIC DAILY PO) Take 2 each by mouth at bedtime.    [provider]  TURMERIC CURCUMIN PO Take 2 capsules by mouth daily.    [provider]      Allergies    Metoprolol, Cipro [ciprofloxacin hcl], and Prednisone    Review of Systems   Review of Systems  Constitutional:  Negative for chills and fever.  HENT:  Positive for congestion, ear pain, sinus pressure and sinus pain.   Respiratory:  Positive for chest tightness. Negative for cough and shortness of breath.   Cardiovascular:  Negative for chest pain.  Gastrointestinal:  Negative for abdominal pain, nausea and vomiting.  Genitourinary:  Negative for dysuria and frequency.  Musculoskeletal:  Positive for neck pain.  Neurological:  Positive for numbness and headaches. Negative for dizziness, syncope and light-headedness.  All other systems reviewed and are negative.   Physical Exam Updated Vital Signs BP (!) 144/81 (BP Location: Right Arm)   Pulse (!) 109   Temp 98.6 F (37 C) (Oral)   Resp (!) 22   LMP 08/03/2022   SpO2 100%  Physical Exam Vitals and nursing note reviewed.  Constitutional:      General: She is not in acute distress.    Appearance: Normal appearance. She is well-developed. She is not ill-appearing or diaphoretic.  HENT:     Head: Normocephalic and atraumatic.     Right Ear: Tympanic membrane and ear canal normal.     Left Ear: Tympanic membrane and ear canal normal.     Nose: Congestion and rhinorrhea present.     Comments:  some congestion and clear rhinorrhea noted with edema of bilateral nasal passages worse on the left than right mild tenderness over the left axillary and frontal sinus    Mouth/Throat:     Mouth: Mucous membranes are moist.     Pharynx: Oropharynx is clear.  Eyes:     General:        Right eye: No discharge.        Left eye: No discharge.     Extraocular  Movements: Extraocular movements intact.     Pupils: Pupils are equal, round, and reactive to light.  Cardiovascular:     Rate and Rhythm: Normal rate and regular rhythm.     Pulses: Normal pulses.     Heart sounds: Normal heart sounds.  Pulmonary:     Effort: Pulmonary effort is normal. No respiratory distress.     Breath sounds: Normal breath sounds. No wheezing or rales.     Comments: Respirations equal and unlabored, patient able to speak in full sentences, lungs clear to auscultation bilaterally  Abdominal:     General: Bowel sounds are normal. There is no distension.  Palpations: Abdomen is soft. There is no mass.     Tenderness: There is no abdominal tenderness. There is no guarding.     Comments: Abdomen soft, nondistended, nontender to palpation in all quadrants without guarding or peritoneal signs  Musculoskeletal:        General: No deformity.     Cervical back: Neck supple.  Skin:    General: Skin is warm and dry.     Capillary Refill: Capillary refill takes less than 2 seconds.  Neurological:     Mental Status: She is alert and oriented to person, place, and time.     Coordination: Coordination normal.     Comments: Neurological Exam:  Mental Status: Alert and oriented to person, place, and time. Attention and concentration normal. Speech clear. Recent memory is intact. Follows commands. Cranial Nerves: Visual fields grossly intact. EOMI and PERRLA. No nystagmus noted. Facial sensation intact at forehead, maxillary cheek, and chin/mandible bilaterally. No facial asymmetry or weakness. Hearing grossly normal. Uvula is midline, and palate elevates symmetrically. Normal SCM and trapezius strength. Tongue midline without fasciculations. Motor: Muscle strength 5/5 in proximal and distal UE and LE bilaterally. No pronator drift. Muscle tone normal. Reflexes: 2+ and symmetrical in all four extremities.  Sensation: Intact to light touch in upper and lower extremities distally  bilaterally.  No numbness or sensory deficit noted on exam currently Gait: Normal without ataxia. Coordination: Normal FTN bilaterally.   Psychiatric:        Mood and Affect: Mood normal.        Behavior: Behavior normal.     ED Results / Procedures / Treatments   Labs (all labs ordered are listed, but only abnormal results are displayed) Labs Reviewed  BASIC METABOLIC PANEL - Abnormal; Notable for the following components:      Result Value   Glucose, Bld 120 (*)    All other components within normal limits  RESP PANEL BY RT-PCR (RSV, FLU A&B, COVID)  RVPGX2  CBC  URINALYSIS, ROUTINE W REFLEX MICROSCOPIC  PREGNANCY, URINE    EKG EKG Interpretation  Date/Time:  Thursday August 03 2022 11:01:07 EST Ventricular Rate:  111 PR Interval:  135 QRS Duration: 92 QT Interval:  351 QTC Calculation: 477 R Axis:   70 Text Interpretation: Sinus tachycardia Borderline T abnormalities, diffuse leads No significant change since last tracing Confirmed by Linwood Dibbles [phone]) on 08/03/2022 11:03:08 AM  Radiology CT Head Wo Contrast  Result Date: 08/03/2022 CLINICAL DATA:  Headaches, left-sided numbness EXAM: CT HEAD WITHOUT CONTRAST TECHNIQUE: Contiguous axial images were obtained from the base of the skull through the vertex without intravenous contrast. RADIATION DOSE REDUCTION: This exam was performed according to the departmental dose-optimization program which includes automated exposure control, adjustment of the mA and/or kV according to patient size and/or use of iterative reconstruction technique. COMPARISON:  02/11/2022 FINDINGS: Brain: No acute intracranial findings are seen. There are no signs of bleeding within the cranium. Ventricles are not dilated. There is no focal edema or mass effect. Vascular: Unremarkable. Skull: Unremarkable. Sinuses/Orbits: There is minimal mucosal thickening in the ethmoid sinus. Maxillary sinuses are not visualized in their entirety. Other: None.  IMPRESSION: No acute intracranial findings are seen in noncontrast CT brain. Electronically Signed   By: Ernie Avena M.D.   On: 08/03/2022 11:23    Procedures Procedures    Medications Ordered in ED Medications  ketorolac (TORADOL) 15 MG/ML injection 15 mg (15 mg Intravenous Given 08/03/22 1404)  dexamethasone (DECADRON) injection 10 mg (  10 mg Other Given 08/03/22 1404)  metoCLOPramide (REGLAN) injection 10 mg (10 mg Intravenous Given 08/03/22 1405)  diphenhydrAMINE (BENADRYL) injection 25 mg (25 mg Intravenous Given 08/03/22 1404)  sodium chloride 0.9 % bolus 1,000 mL (0 mLs Intravenous Stopped 08/03/22 1520)  magnesium sulfate IVPB 2 g 50 mL (0 g Intravenous Stopped 08/03/22 1520)    ED Course/ Medical Decision Making/ A&P                           Medical Decision Making Amount and/or Complexity of Data Reviewed Labs: ordered. Radiology: ordered.  Risk Prescription drug management.   36 y.o. female presents to the ED with complaints of headache, left-sided numbness, facial pain and pressure, nasal congestion, this involves an extensive number of treatment options, and is a complaint that carries with it a high risk of complications and morbidity.  The differential diagnosis includes migraine headache, cervical migraine, tension headache, sinusitis, complex migraine, stroke considered but felt to be less likely given normal neurologic exam at this time.  Also considered subarachnoid hemorrhage and venous sinus thrombosis, meningitis but given progression of symptoms and history of similar symptoms previously feel these are less likely.  On arrival pt is nontoxic, vitals initially significant for tachycardia and elevated blood pressure but this resolved with treatment of headache. Exam significant for no current neurologic deficits  Additional history obtained from chart review. Previous records obtained and reviewed including prior ED visits and neurology follow-up  I  ordered medication including IV fluids, magnesium, Benadryl, Reglan, Decadron and Toradol for treatment of headache  Lab Tests:  I Ordered, reviewed, and interpreted labs, which included: No leukocytosis and normal hemoglobin, no significant electrolyte derangements and normal renal function, negative pregnancy, UA with RBCs present, currently on menstrual cycle, some bacteria noted the patient without any urinary symptoms suspect this is more likely contamination.  Negative respiratory viral panel  Imaging Studies ordered:  I ordered imaging studies which included CT head, I independently visualized and interpreted imaging which showed no acute intracranial abnormality.  Maxillary sinuses not entirely visualized  ED Course:   Given that patient has had similar symptoms previously with negative MRI at that time and also has evidence of sinusitis clinically that could be contributing will treat with headache cocktail given reassuring head CT prior to performing any additional head imaging.  After headache cocktail patient reports no further paresthesias or sensation of numbness and significant improvement in headache.  Given this improvement I had a shared decision-making discussion with patient and we decided to hold off on MRI brain at this time.  I stressed the importance of close neurology follow-up and discussed strict return precautions.  Discussed continued supportive care at home and will prescribe antibiotics for underlying sinusitis.      Portions of this note were generated with Scientist, clinical (histocompatibility and immunogenetics). Dictation errors may occur despite best attempts at proofreading.         Final Clinical Impression(s) / ED Diagnoses Final diagnoses:  Bad headache  Paresthesia  Acute recurrent sinusitis, unspecified location    Rx / DC Orders ED Discharge Orders          Ordered    amoxicillin-clavulanate (AUGMENTIN) 875-125 MG tablet  Every 12 hours        08/03/22 1644               Dartha Lodge, [place] 08/17/22 1512    Linwood Dibbles, MD 08/18/22 1052

## 2022-08-03 NOTE — ED Notes (Signed)
Reviewed discharge instructions, follow up and medications with pt. Pt states understanding. Ambulatory upon discharge

## 2022-08-05 ENCOUNTER — Emergency Department (HOSPITAL_COMMUNITY): Payer: 59

## 2022-08-05 ENCOUNTER — Encounter (HOSPITAL_COMMUNITY): Payer: Self-pay | Admitting: Emergency Medicine

## 2022-08-05 ENCOUNTER — Emergency Department (HOSPITAL_COMMUNITY)
Admission: EM | Admit: 2022-08-05 | Discharge: 2022-08-06 | Disposition: A | Discharge status: Home / Self Care | Payer: 59 | Attending: Emergency Medicine | Admitting: Emergency Medicine

## 2022-08-05 ENCOUNTER — Other Ambulatory Visit: Payer: Self-pay

## 2022-08-05 DIAGNOSIS — I10 Essential (primary) hypertension: Secondary | ICD-10-CM | POA: Insufficient documentation

## 2022-08-05 DIAGNOSIS — Z7951 Long term (current) use of inhaled steroids: Secondary | ICD-10-CM | POA: Diagnosis not present

## 2022-08-05 DIAGNOSIS — R21 Rash and other nonspecific skin eruption: Secondary | ICD-10-CM | POA: Insufficient documentation

## 2022-08-05 DIAGNOSIS — R519 Headache, unspecified: Secondary | ICD-10-CM | POA: Diagnosis not present

## 2022-08-05 DIAGNOSIS — R079 Chest pain, unspecified: Secondary | ICD-10-CM | POA: Diagnosis present

## 2022-08-05 DIAGNOSIS — R072 Precordial pain: Secondary | ICD-10-CM | POA: Diagnosis not present

## 2022-08-05 DIAGNOSIS — Z7952 Long term (current) use of systemic steroids: Secondary | ICD-10-CM | POA: Insufficient documentation

## 2022-08-05 LAB — BASIC METABOLIC PANEL
Anion gap: 7 (ref 5–15)
BUN: 14 mg/dL (ref 6–20)
CO2: 25 mmol/L (ref 22–32)
Calcium: 9.3 mg/dL (ref 8.9–10.3)
Chloride: 107 mmol/L (ref 98–111)
Creatinine, Ser: 0.92 mg/dL (ref 0.44–1.00)
GFR, Estimated: >60 mL/min (ref >60)
Glucose, Bld: 135 mg/dL — ABNORMAL HIGH (ref 70–99)
Potassium: 3.8 mmol/L (ref 3.5–5.1)
Sodium: 139 mmol/L (ref 135–145)

## 2022-08-05 LAB — CBC
HCT: 40.7 % (ref 36.0–46.0)
Hemoglobin: 13.7 g/dL (ref 12.0–15.0)
MCH: 28.7 pg (ref 26.0–34.0)
MCHC: 33.7 g/dL (ref 30.0–36.0)
MCV: 85.1 fL (ref 80.0–100.0)
Platelets: 317 10*3/uL (ref 150–400)
RBC: 4.78 MIL/uL (ref 3.87–5.11)
RDW: 13.3 % (ref 11.5–15.5)
WBC: 9.9 10*3/uL (ref 4.0–10.5)
nRBC: 0 % (ref 0.0–0.2)

## 2022-08-05 LAB — I-STAT BETA HCG BLOOD, ED (MC, WL, AP ONLY): I-stat hCG, quantitative: <5 m[IU]/mL (ref <5)

## 2022-08-05 LAB — TROPONIN I (HIGH SENSITIVITY): Troponin I (High Sensitivity): <2 ng/L (ref <18)

## 2022-08-05 MED ORDER — LORAZEPAM 0.5 MG PO TABS: 0.5000 mg | ORAL_TABLET | Freq: Once | ORAL | Status: DC | PRN

## 2022-08-05 MED ORDER — LORAZEPAM 2 MG/ML IJ SOLN: 1.0000 mg | Freq: Once | INTRAMUSCULAR | Status: DC | PRN

## 2022-08-05 NOTE — ED Provider Notes (Cosign Needed)
[facility]-EMERGENCY DEPT Provider Note   CSN: [id number] Arrival date & time: 08/05/22  1900     History  Chief Complaint  Patient presents with   Chest Pain    Sheri Johnson is a 36 y.o. female.  Patient with history of anxiety, SVT, hypertension --presents to the emergency department today for continued headaches, left-sided numbness symptoms and rash, chest pressure.  Patient was seen in the emergency department on 08/03/2022 for the same.  Workup at that time was negative.  She was given an antibiotic for potential sinusitis given facial pain and ear fullness.  She reports intermittent left-sided headache, numbness which includes the arm and face, locking of her jaw that makes it difficult for her to chew.  This then resolves.  This can be triggered by showers and with chewing.  Rash involves the left face and left arm.  She has also had intermittent chest pressure with shortness of breath.  No radiation of pain.  No diaphoresis or vomiting with this.  She has a headache that is different than her typical migraines.  She describes this as a "tension headache".  The medication she received in the emergency department previously helped briefly, however returned after returning home.  Patient denies signs of stroke including: facial droop, slurred speech, aphasia, imbalance/trouble walking.        Home Medications Prior to Admission medications   Medication Sig Start Date End Date Taking? Authorizing Provider  albuterol (VENTOLIN HFA) 108 (90 Base) MCG/ACT inhaler Inhale 2 puffs into the lungs every 6 (six) hours as needed for wheezing or shortness of breath.    [provider]  ALPRAZolam (XANAX XR) 0.5 MG 24 hr tablet Take by mouth. 07/25/22   [provider]  amoxicillin-clavulanate (AUGMENTIN) 875-125 MG tablet Take 1 tablet by mouth every 12 (twelve) hours. 08/03/22   Dartha Lodge, PA-C  Ascorbic Acid (VITAMIN C) 1000 MG tablet Take  1,000 mg by mouth daily.    [provider]  atenolol (TENORMIN) 25 MG tablet Take 1 tablet (25 mg total) by mouth 2 (two) times daily. 07/27/22 10/25/22  Azalee Course, PA  b complex vitamins capsule Take 1 capsule by mouth daily.    [provider]  cetirizine (ZYRTEC) 10 MG tablet Take 10 mg by mouth 2 (two) times daily.    [provider]  clobetasol (TEMOVATE) 0.05 % external solution Apply 1 Application topically at bedtime. Apply to scalp 02/27/22   [provider]  cyclobenzaprine (FLEXERIL) 10 MG tablet Take 10 mg by mouth in the morning and at bedtime. 07/07/21   [provider]  Digestive Enzymes CAPS Take 1 capsule by mouth 2 (two) times daily before a meal.    [provider]  fluticasone (FLONASE) 50 MCG/ACT nasal spray Place 1 spray into both nostrils daily as needed for allergies or rhinitis.    [provider]  fluticasone (FLOVENT HFA) 44 MCG/ACT inhaler Inhale 1 puff into the lungs 2 (two) times daily.    [provider]  ibuprofen (ADVIL) 800 MG tablet Take 800 mg by mouth every 8 (eight) hours as needed for moderate pain.    [provider]  levothyroxine (SYNTHROID) 100 MCG tablet Take 100 mcg by mouth every morning.    [provider]  Magnesium 400 MG CAPS Take 400 mg by mouth daily as needed (Low Magnesium).    [provider]  OVER THE COUNTER MEDICATION Take 2 tablets by mouth daily. Medication:  Beet Root Supplement    [provider]  OVER THE COUNTER MEDICATION Take 2 capsules by mouth at bedtime. Medication: Clear Lungs    [provider]  potassium chloride SA (KLOR-CON M) 20 MEQ tablet Take 1 tablet (20 mEq total) by mouth daily. 12/14/21   Richardson Dopp T, PA-C  Probiotic Product (PROBIOTIC DAILY PO) Take 2 each by mouth at bedtime.    [provider]  TURMERIC CURCUMIN PO Take 2 capsules by mouth daily.    [provider]      Allergies     Metoprolol, Cipro [ciprofloxacin hcl], and Prednisone    Review of Systems   Review of Systems  Physical Exam Updated Vital Signs BP (!) 138/90 (BP Location: Right Arm)   Pulse 78   Temp 98.3 F (36.8 C) (Oral)   Resp 16   Ht 5\' 9"  (1.753 m)   Wt 108 kg   LMP 08/03/2022 (Exact Date)   SpO2 98%   BMI 35.15 kg/m   Physical Exam Vitals and nursing note reviewed.  Constitutional:      Appearance: She is well-developed. She is not diaphoretic.  HENT:     Head: Normocephalic and atraumatic.     Right Ear: Tympanic membrane, ear canal and external ear normal.     Left Ear: Tympanic membrane, ear canal and external ear normal.     Nose: Nose normal.     Mouth/Throat:     Mouth: Mucous membranes are not dry.     Pharynx: Uvula midline.  Eyes:     General: Lids are normal.     Extraocular Movements:     Right eye: No nystagmus.     Left eye: No nystagmus.     Conjunctiva/sclera: Conjunctivae normal.     Pupils: Pupils are equal, round, and reactive to light.  Neck:     Vascular: Normal carotid pulses. No JVD.     Trachea: Trachea normal. No tracheal deviation.  Cardiovascular:     Rate and Rhythm: Normal rate and regular rhythm.     Pulses: No decreased pulses.          Radial pulses are 2+ on the right side and 2+ on the left side.     Heart sounds: Normal heart sounds, S1 normal and S2 normal. No murmur heard. Pulmonary:     Effort: Pulmonary effort is normal. No respiratory distress.     Breath sounds: Normal breath sounds. No wheezing.  Chest:     Chest wall: No tenderness.  Abdominal:     General: Bowel sounds are normal.     Palpations: Abdomen is soft.     Tenderness: There is no abdominal tenderness. There is no guarding or rebound.  Musculoskeletal:        General: Normal range of motion.     Cervical back: Normal range of motion and neck supple. No tenderness or bony tenderness. No muscular tenderness.  Skin:    General: Skin is warm and dry.      Coloration: Skin is not pale.  Neurological:     Mental Status: She is alert and oriented to person, place, and time.     GCS: GCS eye subscore is 4. GCS verbal subscore is 5. GCS motor subscore is 6.     Cranial Nerves: No cranial nerve deficit.     Sensory: No sensory deficit.     Motor: No weakness.     Coordination: Coordination normal.     Gait:  Gait normal.     Comments: Upper extremity myotomes tested bilaterally:  C5 Shoulder abduction 5/5 C6 Elbow flexion/wrist extension 5/5 C7 Elbow extension 5/5 C8 Finger flexion 5/5 T1 Finger abduction 5/5  Lower extremity myotomes tested bilaterally: L2 Hip flexion 5/5 L3 Knee extension 5/5 L4 Ankle dorsiflexion 5/5 S1 Ankle plantar flexion 5/5   Psychiatric:        Mood and Affect: Mood is anxious.     ED Results / Procedures / Treatments   Labs (all labs ordered are listed, but only abnormal results are displayed) Labs Reviewed  BASIC METABOLIC PANEL - Abnormal; Notable for the following components:      Result Value   Glucose, Bld 135 (*)    All other components within normal limits  CBC  TSH  I-STAT BETA HCG BLOOD, ED ([facility], [facility], [facility] ONLY)  TROPONIN I (HIGH SENSITIVITY)    ED ECG REPORT   Date: 08/05/2022  Rate: 108  Rhythm: sinus tachycardia  QRS Axis: normal  Intervals: normal  ST/T Wave abnormalities: nonspecific T wave changes  Conduction Disutrbances:none  Narrative Interpretation:   Old EKG Reviewed: unchanged  I have personally reviewed the EKG tracing and agree with the computerized printout as noted.   Radiology MR BRAIN WO CONTRAST  Result Date: 08/05/2022 CLINICAL DATA:  Acute neurologic deficit.  Migraines. EXAM: MRI HEAD WITHOUT CONTRAST TECHNIQUE: Multiplanar, multiecho pulse sequences of the brain and surrounding structures were obtained without intravenous contrast. COMPARISON:  10/17/2021 FINDINGS: Brain: No acute infarct, mass effect or extra-axial collection. No acute or chronic hemorrhage.  Normal white matter signal, parenchymal volume and CSF spaces. The midline structures are normal. Vascular: Major flow voids are preserved. Skull and upper cervical spine: Normal calvarium and skull base. Visualized upper cervical spine and soft tissues are normal. Sinuses/Orbits:Right maxillary sinus retention cyst.  Normal orbits. IMPRESSION: Normal brain MRI. Electronically Signed   By: Ulyses Jarred M.D.   On: 08/05/2022 21:39   DG Chest 2 View  Result Date: 08/05/2022 CLINICAL DATA:  Chest pain with burning sensation in her left jaw, left arm and back. EXAM: CHEST - 2 VIEW COMPARISON:  July 19, 2022 FINDINGS: The heart size and mediastinal contours are within normal limits. Mild atelectasis is noted within the left lung base. There is no evidence of a pleural effusion or pneumothorax. The visualized skeletal structures are unremarkable. IMPRESSION: Mild left basilar atelectasis. Electronically Signed   By: Virgina Norfolk M.D.   On: 08/05/2022 20:47    Procedures Procedures    Medications Ordered in ED Medications  LORazepam (ATIVAN) tablet 0.5 mg (has no administration in time range)  LORazepam (ATIVAN) injection 1 mg (has no administration in time range)    ED Course/ Medical Decision Making/ A&P    Patient seen and examined. History obtained directly from patient. Work-up including labs, imaging, EKG ordered in triage, if performed, were reviewed.    Labs/EKG: Independently reviewed and interpreted.  This included: CBC unremarkable; BMP slightly elevated glucose of 135 otherwise unremarkable; troponin less than 2; negative pregnancy.  TSH pending.  EKG reviewed and interpreted as above.  Imaging: Independently reviewed and interpreted.  This included: Chest x-ray, agree negative; MRI brain agree no gross abnormality.  Medications/Fluids: None ordered.  Patient declines Ativan.  He declines additional medications for headache at this time.  Most recent vital signs reviewed  and are as follows: BP (!) 138/90 (BP Location: Right Arm)   Pulse 78   Temp 98.3 F (36.8 C) (Oral)  Resp 16   Ht 5\' 9"  (1.753 m)   Wt 108 kg   LMP 08/03/2022 (Exact Date)   SpO2 98%   BMI 35.15 kg/m   Initial impression: Headache without red flags, chest pain with negative workup.  Home treatment plan: Continue OTC meds, recently prescribed antibiotics.  Return instructions discussed with patient: Patient counseled to return if they have weakness in their arms or legs, slurred speech, trouble walking or talking, confusion, trouble with their balance, or if they have any other concerns. Patient verbalizes understanding and agrees with plan.   Return and follow-up instructions: I encouraged patient to return to ED with severe chest pain, especially if the pain is crushing or pressure-like and spreads to the arms, back, neck, or jaw, or if they have associated sweating, vomiting, or shortness of breath with the pain, or significant pain with activity. We discussed that the evaluation here today indicates a low-risk of serious cause of chest pain, including heart trouble or a blood clot, but no evaluation is perfect and chest pain can evolve with time. The patient verbalized understanding and agreed.  I encouraged patient to follow-up with their provider in the next 48 hours for recheck.    Follow-up instructions discussed with patient: Encouraged PCP follow-up, neurology follow-up.                          Medical Decision Making Amount and/or Complexity of Data Reviewed Labs: ordered. Radiology: ordered.   In regards to the patient's headache, critical differentials were considered including subarachnoid hemorrhage, intracerebral hemorrhage, epidural/subdural hematoma, pituitary apoplexy, vertebral/carotid artery dissection, giant cell arteritis, central venous thrombosis, reversible cerebral vasoconstriction, acute angle closure glaucoma, idiopathic intracranial hypertension,  bacterial meningitis, viral encephalitis, carbon monoxide poisoning, posterior reversible encephalopathy syndrome, pre-eclampsia.   Reg flag symptoms related to these causes were considered including systemic symptoms (fever, weight loss), neurologic symptoms (confusion, mental status change, vision change, associated seizure), acute or sudden "thunderclap" onset, patient age 30 or older with new or progressive headache, patient of any age with first headache or change in headache pattern, pregnant or postpartum status, history of HIV or other immunocompromise, history of cancer, headache occurring with exertion, associated neck or shoulder pain, associated traumatic injury, concurrent use of anticoagulation, family history of spontaneous SAH, and concurrent drug use.    Other benign, more common causes of headache were considered including migraine, tension-type headache, cluster headache, referred pain from other cause such as sinus infection, dental pain, trigeminal neuralgia.   On exam, patient has a reassuring neuro exam including baseline mental status, no significant neck pain or meningeal signs, no signs of severe infection or fever. MRI was negative. Recent head CT also unremarkable.   For this patient's complaint of chest pain, the following emergent conditions were considered on the differential diagnosis: acute coronary syndrome, pulmonary embolism, pneumothorax, myocarditis, pericardial tamponade, aortic dissection, thoracic aortic aneurysm complication, esophageal perforation.   Other causes were also considered including: gastroesophageal reflux disease, musculoskeletal pain including costochondritis, pneumonia/pleurisy, herpes zoster, pericarditis.  In regards to possibility of ACS, patient has atypical features of pain, non-ischemic and unchanged EKG and negative troponin(s). Heart score was calculated to be 2.   In regards to possibility of PE, symptoms are atypical for PE and risk  profile is low, making PE low likelihood.   The patient's vital signs, pertinent lab work and imaging were reviewed and interpreted as discussed in the ED course. Hospitalization was considered for further  testing, treatments, or serial exams/observation. However as patient is well-appearing, has a stable exam, and reassuring studies today, I do not feel that they warrant admission at this time. This plan was discussed with the patient who verbalizes agreement and comfort with this plan and seems reliable and able to return to the Emergency Department with worsening or changing symptoms.         Final Clinical Impression(s) / ED Diagnoses Final diagnoses:  Precordial pain  Acute nonintractable headache, unspecified headache type  Facial pain  Rash    Rx / DC Orders ED Discharge Orders     None         Carlisle Cater, PA-C 08/06/22 0002

## 2022-08-05 NOTE — ED Provider Triage Note (Cosign Needed)
Emergency Medicine Provider Triage Evaluation Note  Anniece Bleiler , a 36 y.o. female  was evaluated in triage.  Pt complains of multiple complaints: throat fullness, trouble swallowing, left sided shoulder pain, Left ear ringing and head pressure. Seen for same and told she needed to see neuro and maybe get an mri.  Review of Systems  Positive: Multiple complaints Negative: fever  Physical Exam  BP (!) 155/87 (BP Location: Right Arm)   Pulse (!) 113   Temp 98.1 F (36.7 C) (Oral)   Resp 16   Ht 5\' 9"  (1.753 m)   Wt 108 kg   LMP 08/03/2022 (Exact Date)   SpO2 100%   BMI 35.15 kg/m  Gen:   Awake, no distress   Resp:  Normal effort  MSK:   Moves extremities without difficulty  Other:  Swallowing spit, anxious  Medical Decision Making  Medically screening exam initiated at 7:41 PM.  Appropriate orders placed.  Cecila Satcher was informed that the remainder of the evaluation will be completed by another provider, this initial triage assessment does not replace that evaluation, and the importance of remaining in the ED until their evaluation is complete.     Felipe Drone, PA-C 08/05/22 1946

## 2022-08-05 NOTE — ED Triage Notes (Signed)
Pt arriving with complaint of chest pain. Pt states she was recently in the hospital for migraines but feels like today she is having the same symptoms amplified x10. Burning sensation in her left jaw, left arm, and back. Rating pain 10/10 at this time.

## 2022-08-05 NOTE — Discharge Instructions (Addendum)
Please read and follow all provided instructions.  Your diagnoses today include:  1. Precordial pain   2. Acute nonintractable headache, unspecified headache type   3. Facial pain   4. Rash    Tests performed today include: An EKG of your heart: Heart rate a little fast but unchanged from previous A chest x-ray: No problems Cardiac enzymes - a blood test for heart muscle damage Blood counts and electrolytes MRI brain: no problems Vital signs. See below for your results today.   Medications prescribed:  None  Take any prescribed medications only as directed.  Follow-up instructions: Please follow-up with your primary care provider and neurologist as soon as you can for further evaluation of your symptoms.   Return instructions:  SEEK IMMEDIATE MEDICAL ATTENTION IF: You have severe chest pain, especially if the pain is crushing or pressure-like and spreads to the arms, back, neck, or jaw, or if you have sweating, nausea or vomiting, or trouble with breathing. THIS IS AN EMERGENCY. Do not wait to see if the pain will go away. Get medical help at once. Call 911. DO NOT drive yourself to the hospital.  Your chest pain gets worse and does not go away after a few minutes of rest.  You have an attack of chest pain lasting longer than what you usually experience.  You have significant dizziness, if you pass out, or have trouble walking.  You have chest pain not typical of your usual pain for which you originally saw your caregiver.  You have any other emergent concerns regarding your health.  Additional Information: Chest pain comes from many different causes. Your caregiver has diagnosed you as having chest pain that is not specific for one problem, but does not require admission.  You are at low risk for an acute heart condition or other serious illness.   Your vital signs today were: BP (!) 138/90 (BP Location: Right Arm)   Pulse 78   Temp 98.3 F (36.8 C) (Oral)   Resp 16   Ht 5'  9" (1.753 m)   Wt 108 kg   LMP 08/03/2022 (Exact Date)   SpO2 98%   BMI 35.15 kg/m  If your blood pressure (BP) was elevated above 135/85 this visit, please have this repeated by your doctor within one month. --------------

## 2022-08-14 ENCOUNTER — Telehealth: Payer: Self-pay | Admitting: Cardiology

## 2022-08-14 NOTE — Telephone Encounter (Signed)
Left message for the pt to call back and schedule a tele visit tomorrow at 10:40 per Tessa. I will put the pt on, but I will need the pt to call back and confirm appt.

## 2022-08-14 NOTE — Telephone Encounter (Signed)
Follow Up:      [name] is calling to check on thstatus of patient's clearance. She is scheduled for her procedure on Friday(08-18-22).

## 2022-08-14 NOTE — Telephone Encounter (Signed)
[name], CMA , called back from requesting office. [name] was able to confirm procedure information and anesthesia will be propofol.

## 2022-08-14 NOTE — Telephone Encounter (Signed)
   Name: Sheri Johnson  DOB: 12-23-1985  MRN: [id number]  Primary Cardiologist: Kirk Ruths, MD  Chart reviewed as part of pre-operative protocol coverage. Because of Sonam Wandel past medical history and time since last visit, she will require a follow-up telephone visit in order to better assess preoperative cardiovascular risk.  Pre-op covering staff: - Please schedule appointment and call patient to inform them. If patient already had an upcoming appointment within acceptable timeframe, please add "pre-op clearance" to the appointment notes so provider is aware. - Please contact requesting surgeon's office via preferred method (i.e, phone, fax) to inform them of need for appointment prior to surgery.  No medications indicated as needing held.  Elgie Collard, PA-C  08/14/2022, 1:25 PM

## 2022-08-14 NOTE — Telephone Encounter (Signed)
   Pre-operative Risk Assessment    Patient Name: Sheri Johnson  DOB: 08-24-1985 MRN: [id number]    I s/w Sheri Johnson at the requesting office. I was able to confirm most of the information needed. Will have Sheri Johnson, CMA return call to me to confirm the anesthesia to be used, propofol?   Request for Surgical Clearance    Procedure:   EGD/COLONOSCOPY  Date of Surgery:  Clearance 08/18/22                                 Surgeon:  DR. Reesa Chew Surgeon's Group or Practice Name:  [facility] Phone number:  [phone] ATTN: Sheri Johnson, CMA Fax number:  [phone]   Type of Clearance Requested:   - Medical ; NO MEDICATIONS ARE LISTED AS NEEDING TO BE HELD   Type of Anesthesia:   PROPOFOL  ?   Additional requests/questions:    Sheri Johnson   08/14/2022, 12:26 PM

## 2022-08-15 ENCOUNTER — Ambulatory Visit: Payer: 59

## 2022-08-15 NOTE — Telephone Encounter (Signed)
I s/w the pt and she has postponed her colonoscopy for now. Pt will let us know if she has rescheduled her colonoscopy. I did ask if she does reschedule procedure to have GI office fax over a new request. Pt agreeable to plan. I will update the requesting office of my call today with the pt.

## 2022-08-15 NOTE — Telephone Encounter (Signed)
  Pt returning call, she said she is rescheduling her colonoscopy and will call her GI doctor to know the date and wants to r/s her preop appt closer to her procedure. She requesting if Arbie Cookey can call her back

## 2022-09-07 ENCOUNTER — Ambulatory Visit
Admission: EM | Admit: 2022-09-07 | Discharge: 2022-09-07 | Disposition: A | Discharge status: Home / Self Care | Payer: 59 | Attending: Internal Medicine | Admitting: Internal Medicine

## 2022-09-07 DIAGNOSIS — Z1152 Encounter for screening for COVID-19: Secondary | ICD-10-CM | POA: Insufficient documentation

## 2022-09-07 DIAGNOSIS — Z8679 Personal history of other diseases of the circulatory system: Secondary | ICD-10-CM | POA: Diagnosis not present

## 2022-09-07 DIAGNOSIS — H6992 Unspecified Eustachian tube disorder, left ear: Secondary | ICD-10-CM

## 2022-09-07 DIAGNOSIS — Z7951 Long term (current) use of inhaled steroids: Secondary | ICD-10-CM | POA: Insufficient documentation

## 2022-09-07 DIAGNOSIS — J320 Chronic maxillary sinusitis: Secondary | ICD-10-CM | POA: Diagnosis not present

## 2022-09-07 DIAGNOSIS — J3489 Other specified disorders of nose and nasal sinuses: Secondary | ICD-10-CM | POA: Insufficient documentation

## 2022-09-07 DIAGNOSIS — H9202 Otalgia, left ear: Secondary | ICD-10-CM | POA: Diagnosis not present

## 2022-09-07 DIAGNOSIS — I471 Supraventricular tachycardia, unspecified: Secondary | ICD-10-CM | POA: Insufficient documentation

## 2022-09-07 DIAGNOSIS — R509 Fever, unspecified: Secondary | ICD-10-CM | POA: Insufficient documentation

## 2022-09-07 DIAGNOSIS — T3695XA Adverse effect of unspecified systemic antibiotic, initial encounter: Secondary | ICD-10-CM | POA: Diagnosis not present

## 2022-09-07 DIAGNOSIS — B379 Candidiasis, unspecified: Secondary | ICD-10-CM | POA: Diagnosis present

## 2022-09-07 DIAGNOSIS — J029 Acute pharyngitis, unspecified: Secondary | ICD-10-CM | POA: Insufficient documentation

## 2022-09-07 DIAGNOSIS — J45909 Unspecified asthma, uncomplicated: Secondary | ICD-10-CM | POA: Diagnosis not present

## 2022-09-07 LAB — POCT RAPID STREP A (OFFICE): Rapid Strep A Screen: NEGATIVE

## 2022-09-07 MED ORDER — DOXYCYCLINE HYCLATE 100 MG PO CAPS: 100.0000 mg | ORAL_CAPSULE | Freq: Two times a day (BID) | ORAL | 0 refills | Status: DC

## 2022-09-07 MED ORDER — FLUCONAZOLE 150 MG PO TABS: 150.0000 mg | ORAL_TABLET | Freq: Every day | ORAL | 0 refills | Status: DC

## 2022-09-07 NOTE — Discharge Instructions (Signed)
Start doxycycline twice daily for 10 days The clinic will contact you with result of your COVID test is positive Continue Flonase as previously prescribed Nasal rinses as tolerated Rest and fluids Please follow-up with your PCP in 2 to 3 days for recheck Please follow-up with your ENT as scheduled appointment in February Please go to the emergency room if you have any worsening symptoms

## 2022-09-07 NOTE — ED Triage Notes (Signed)
Pt states she was previously treated for sinus infection with Augmentin. Now she reports having a sore throat, headache, fever (103F), and tachycardia. The patient states her son was exposed to strep.   Started: a month ago   Home interventions: coricidin, dayquil

## 2022-09-07 NOTE — ED Provider Notes (Signed)
[facility]    CSN: [id number] Arrival date & time: 09/07/22  2633      History   Chief Complaint Chief Complaint  Patient presents with   Sore Throat   Headache   Tachycardia    HPI Sheri Johnson is a 37 y.o. female who presents for evaluation of URI symptoms for 1 month. Patient reports associated symptoms of left-sided sinus pressure/pain with purulent nasal discharge, sinus headache.  Patient was treated with Augmentin on 1/19 for sinusitis by her PCP.  She states she completed the course and states her nasal discharge went from green to more clear but her symptoms never resolved.  2 days after she completed it she states her symptoms began to worsen again and her nasal drainage is a thick yellow and she has developed a reported fever of 101 to 103 degrees.  She endorses left ear pain as well including sore throat.  She states her son had exposure to strep but has not been diagnosed with strep.  She was still on Augmentin at this time.  She does have a history of asthma and has an albuterol inhaler which she has been using.  She also has a history of SVT, status post ablation, and is unable to take decongestants or prednisone.  States her heart rates been slightly elevated recently but she feels this is related to her symptoms.  Denies any chest pain.  She denies any nausea/vomiting/diarrhea, body aches, shortness of breath, or cough. Pt is vaccinated for COVID. Pt is vaccinated for flu this season. Pt has taken Coricidin OTC for symptoms.  She has an appointment with ENT in a couple weeks.  Pt has no other concerns at this time.    Sore Throat Associated symptoms include headaches.  Headache Associated symptoms: congestion, ear pain, fever, sinus pressure and sore throat     Past Medical History:  Diagnosis Date   Abortion 11/14/2021   Anxiety    Asthma    Frequent headaches    Hypertension    Hypothyroid    SVT (supraventricular tachycardia)     Patient Active  Problem List   Diagnosis Date Noted   Hypertension 12/14/2021   Palpitations 12/14/2021   Chest pain 12/14/2021   SVT (supraventricular tachycardia) 12/13/2021   Acute appendicitis 12/14/2016   Appendicitis 12/14/2016    Past Surgical History:  Procedure Laterality Date   CESAREAN SECTION     x2   LAPAROSCOPIC APPENDECTOMY N/A 12/14/2016   Procedure: APPENDECTOMY LAPAROSCOPIC;  Surgeon: Jovita Kussmaul, MD;  Location: [facility];  Service: General;  Laterality: N/A;   SVT ABLATION N/A 03/31/2022   Procedure: SVT ABLATION;  Surgeon: Evans Lance, MD;  Location: [facility];  Service: Cardiovascular;  Laterality: N/A;    OB History   No obstetric history on file.      Home Medications    Prior to Admission medications   Medication Sig Start Date End Date Taking? Authorizing Provider  doxycycline (VIBRAMYCIN) 100 MG capsule Take 1 capsule (100 mg total) by mouth 2 (two) times daily. 09/07/22  Yes Melynda Ripple, NP  fluconazole (DIFLUCAN) 150 MG tablet Take 1 tablet (150 mg total) by mouth daily. Take 1 tablet by mouth once in the night repeat in 3 days as needed 09/07/22  Yes Melynda Ripple, NP  albuterol (VENTOLIN HFA) 108 (90 Base) MCG/ACT inhaler Inhale 2 puffs into the lungs every 6 (six) hours as needed for wheezing or shortness of breath.  [provider]  ALPRAZolam (XANAX XR) 0.5 MG 24 hr tablet Take by mouth. 07/25/22   [provider]  Ascorbic Acid (VITAMIN C) 1000 MG tablet Take 1,000 mg by mouth daily.    [provider]  atenolol (TENORMIN) 25 MG tablet Take 1 tablet (25 mg total) by mouth 2 (two) times daily. 07/27/22 10/25/22  Almyra Deforest, PA  b complex vitamins capsule Take 1 capsule by mouth daily.    [provider]  cetirizine (ZYRTEC) 10 MG tablet Take 10 mg by mouth 2 (two) times daily.    [provider]  clobetasol (TEMOVATE) 0.05 % external solution Apply 1 Application topically at bedtime. Apply to scalp 02/27/22    [provider]  cyclobenzaprine (FLEXERIL) 10 MG tablet Take 10 mg by mouth in the morning and at bedtime. 07/07/21   [provider]  Digestive Enzymes CAPS Take 1 capsule by mouth 2 (two) times daily before a meal.    [provider]  fluticasone (FLONASE) 50 MCG/ACT nasal spray Place 1 spray into both nostrils daily as needed for allergies or rhinitis.    [provider]  fluticasone (FLOVENT HFA) 44 MCG/ACT inhaler Inhale 1 puff into the lungs 2 (two) times daily.    [provider]  ibuprofen (ADVIL) 800 MG tablet Take 800 mg by mouth every 8 (eight) hours as needed for moderate pain.    [provider]  levothyroxine (SYNTHROID) 100 MCG tablet Take 100 mcg by mouth every morning.    [provider]  Magnesium 400 MG CAPS Take 400 mg by mouth daily as needed (Low Magnesium).    [provider]  OVER THE COUNTER MEDICATION Take 2 tablets by mouth daily. Medication: Beet Root Supplement    [provider]  OVER THE COUNTER MEDICATION Take 2 capsules by mouth at bedtime. Medication: Clear Lungs    [provider]  potassium chloride SA (KLOR-CON M) 20 MEQ tablet Take 1 tablet (20 mEq total) by mouth daily. 12/14/21   Richardson Dopp T, PA-C  Probiotic Product (PROBIOTIC DAILY PO) Take 2 each by mouth at bedtime.    [provider]  TURMERIC CURCUMIN PO Take 2 capsules by mouth daily.    [provider]    Family History Family History  Problem Relation Age of Onset   Hypothyroidism Mother     Social History Social History   Tobacco Use   Smoking status: Never   Smokeless tobacco: Never  Vaping Use   Vaping Use: Never used  Substance Use Topics   Alcohol use: Not Currently    Comment: Rare   Drug use: Never     Allergies   Metoprolol, Cipro [ciprofloxacin hcl], and Prednisone   Review of Systems Review of Systems  Constitutional:  Positive for fever.  HENT:  Positive  for congestion, ear pain, sinus pressure, sinus pain and sore throat.   Neurological:  Positive for headaches.     Physical Exam Triage Vital Signs ED Triage Vitals  Enc Vitals Group     BP 09/07/22 0825 136/85     Pulse Rate 09/07/22 0825 (!) 114     Resp 09/07/22 0825 17     Temp 09/07/22 0825 99.1 F (37.3 C)     Temp Source 09/07/22 0825 Oral     SpO2 09/07/22 0825 98 %     Weight --      Height --      Head Circumference --  Peak Flow --      Pain Score 09/07/22 0834 8     Pain Loc --      Pain Edu? --      Excl. in GC? --    No data found.  Updated Vital Signs BP 136/85 (BP Location: Left Arm)   Pulse (!) 114   Temp 99.1 F (37.3 C) (Oral)   Resp 17   SpO2 98%   Visual Acuity Right Eye Distance:   Left Eye Distance:   Bilateral Distance:    Right Eye Near:   Left Eye Near:    Bilateral Near:     Physical Exam Vitals and nursing note reviewed.  Constitutional:      General: She is not in acute distress.    Appearance: She is well-developed. She is not ill-appearing.  HENT:     Head: Normocephalic and atraumatic.     Right Ear: Tympanic membrane and ear canal normal.     Left Ear: Tympanic membrane and ear canal normal.     Nose: Congestion present.     Right Turbinates: Not swollen or pale.     Left Turbinates: Swollen and pale.     Right Sinus: No maxillary sinus tenderness or frontal sinus tenderness.     Left Sinus: Maxillary sinus tenderness and frontal sinus tenderness present.     Mouth/Throat:     Mouth: Mucous membranes are moist.     Pharynx: Oropharynx is clear. Uvula midline. Posterior oropharyngeal erythema present. No pharyngeal swelling, oropharyngeal exudate or uvula swelling.     Tonsils: No tonsillar exudate or tonsillar abscesses. 2+ on the right. 2+ on the left.  Eyes:     Conjunctiva/sclera: Conjunctivae normal.     Pupils: Pupils are equal, round, and reactive to light.  Cardiovascular:     Rate and Rhythm: Regular  rhythm. Tachycardia present.     Heart sounds: Normal heart sounds.     Comments: Heart rate 114 Pulmonary:     Effort: Pulmonary effort is normal.     Breath sounds: Normal breath sounds.  Musculoskeletal:     Cervical back: Normal range of motion and neck supple.  Lymphadenopathy:     Cervical: No cervical adenopathy.  Skin:    General: Skin is warm and dry.  Neurological:     General: No focal deficit present.     Mental Status: She is alert and oriented to person, place, and time.  Psychiatric:        Mood and Affect: Mood normal.        Behavior: Behavior normal.      UC Treatments / Results  Labs (all labs ordered are listed, but only abnormal results are displayed) Labs Reviewed  CULTURE, GROUP A STREP ([facility])  SARS CORONAVIRUS 2 (TAT 6-24 HRS)  POCT RAPID STREP A (OFFICE)  POCT RAPID STREP A (OFFICE)    EKG   Radiology No results found.  Procedures Procedures (including critical care time)  Medications Ordered in UC Medications - No data to display  Initial Impression / Assessment and Plan / UC Course  I have reviewed the triage vital signs and the nursing notes.  Pertinent labs & imaging results that were available during my care of the patient were reviewed by me and considered in my medical decision making (see chart for details).     Reviewed exam and symptoms with patient.  No red flags on exam.  Patient mildly tacky at 114 in clinic without chest pain  or shortness of breath. COVID PCR and will contact if positive Given persistent symptoms we will start doxycycline x 10 days She is to continue Flonase as prescribed by her PCP No prednisone or decongestants due to her SVT history. Negative rapid strep, will culture Diflucan as patient reports ABX induced vaginitis She has to follow-up with her PCP in 2 days for recheck Follow-up with ENT as scheduled appointment this month Strict ER precautions reviewed and patient verbalized understanding Final  Clinical Impressions(s) / UC Diagnoses   Final diagnoses:  Sore throat  Chronic maxillary sinusitis  Eustachian tube dysfunction, left  Antibiotic-induced yeast infection     Discharge Instructions      Start doxycycline twice daily for 10 days The clinic will contact you with result of your COVID test is positive Continue Flonase as previously prescribed Nasal rinses as tolerated Rest and fluids Please follow-up with your PCP in 2 to 3 days for recheck Please follow-up with your ENT as scheduled appointment in February Please go to the emergency room if you have any worsening symptoms    ED Prescriptions     Medication Sig Dispense Auth. Provider   doxycycline (VIBRAMYCIN) 100 MG capsule Take 1 capsule (100 mg total) by mouth 2 (two) times daily. 20 capsule Melynda Ripple, NP   fluconazole (DIFLUCAN) 150 MG tablet Take 1 tablet (150 mg total) by mouth daily. Take 1 tablet by mouth once in the night repeat in 3 days as needed 2 tablet Melynda Ripple, NP      PDMP not reviewed this encounter.   Melynda Ripple, NP 09/07/22 [phone]

## 2022-09-08 LAB — SARS CORONAVIRUS 2 (TAT 6-24 HRS): SARS Coronavirus 2: NEGATIVE

## 2022-09-10 LAB — CULTURE, GROUP A STREP (THRC)

## 2022-10-03 NOTE — Telephone Encounter (Signed)
Left message for the pt to call back to schedule a sooner appt for pre op clearance.

## 2022-10-03 NOTE — Telephone Encounter (Signed)
   Name: Sheri Johnson  DOB: [birth year]  MRN: [id number]  Primary Cardiologist: Kirk Ruths, MD  Chart reviewed as part of pre-operative protocol coverage. Because of Sheri Johnson past medical history and time since last visit, she will require a follow-up in-office visit in order to better assess preoperative cardiovascular risk.  At last f/u 07/2022 Sheri Johnson had recommended 3 month follow-up. Subsequently seen in ED a week later with multitude of symptoms including chest pressure/pain and SOB. Panic disorder may make virtual visit challenging with confounding symptoms best evaluated in person. Likely best to go ahead with in person visit for purpose of both follow-up and pre-procedure clearance. Can we move up the 11/2021 f/u with Sheri Johnson? Thank you!  Pre-op covering staff: - Please schedule appointment and call patient to inform them.  - Please contact requesting surgeon's office via preferred method (i.e, phone, fax) to inform them of need for appointment prior to surgery.  No meds listed as needing to be held.  Charlie Pitter, PA-C  10/03/2022, 3:21 PM

## 2022-10-03 NOTE — Telephone Encounter (Signed)
SEE PREVIOUS NOTES FROM ORIGINAL CLEARANCE, PT  POST PONED HER PROCEDURE; SEE NEW REQUEST BELOW.     Pre-operative Risk Assessment    Patient Name: Sheri Johnson  DOB: February 08, 1986 MRN: [id number]      Request for Surgical Clearance    Procedure:   ENDOSCOPY AND COLONOSCOPY  Date of Surgery:  Clearance 10/25/22                                 Surgeon:  DR. Reesa Chew Surgeon's Group or Practice Name:  [facility] Phone number:  [phone] Fax number:  [phone]   Type of Clearance Requested:   - Medical ; NO MEDICATIONS LISTED AS NEEDING TO BE HELD   Type of Anesthesia:   PROPOFOL   Additional requests/questions:    Jiles Prows   10/03/2022, 2:57 PM

## 2022-10-04 NOTE — Telephone Encounter (Signed)
2nd attempt to reach pt to schedule sooner appt for preop clearance

## 2022-10-05 NOTE — Telephone Encounter (Signed)
Pt has appt with Laurann Montana, NP 10/09/22 for pre op clearance. I will update all parties involved.

## 2022-10-09 ENCOUNTER — Encounter (HOSPITAL_BASED_OUTPATIENT_CLINIC_OR_DEPARTMENT_OTHER): Payer: Self-pay | Admitting: Family

## 2022-10-09 ENCOUNTER — Ambulatory Visit (HOSPITAL_BASED_OUTPATIENT_CLINIC_OR_DEPARTMENT_OTHER): Payer: 59 | Admitting: Family

## 2022-10-09 VITALS — BP 122/76 | HR 87 | Ht 69.0 in | Wt 245.0 lb

## 2022-10-09 DIAGNOSIS — R002 Palpitations: Secondary | ICD-10-CM | POA: Diagnosis not present

## 2022-10-09 DIAGNOSIS — R0609 Other forms of dyspnea: Secondary | ICD-10-CM

## 2022-10-09 DIAGNOSIS — I739 Peripheral vascular disease, unspecified: Secondary | ICD-10-CM

## 2022-10-09 DIAGNOSIS — M2540 Effusion, unspecified joint: Secondary | ICD-10-CM

## 2022-10-09 DIAGNOSIS — I471 Supraventricular tachycardia, unspecified: Secondary | ICD-10-CM | POA: Diagnosis not present

## 2022-10-09 DIAGNOSIS — R600 Localized edema: Secondary | ICD-10-CM

## 2022-10-09 NOTE — Patient Instructions (Addendum)
Medication Instructions:  Your Physician recommend you continue on your current medication as directed.    *If you need a refill on your cardiac medications before your next appointment, please call your pharmacy*  Testing/Procedures: Your physician has requested that you have an echocardiogram before your colonoscopy. Echocardiography is a painless test that uses sound waves to create images of your heart. It provides your doctor with information about the size and shape of your heart and how well your heart's chambers and valves are working. This procedure takes approximately one hour. There are no restrictions for this procedure. Please do NOT wear cologne, perfume, aftershave, or lotions (deodorant is allowed). Please arrive 15 minutes prior to your appointment time.  Your physician has requested that you have an ankle brachial index (ABI) and lower extremity arterial. During this test an ultrasound and blood pressure cuff are used to evaluate the arteries that supply the arms and legs with blood. Allow thirty minutes for this exam. There are no restrictions or special instructions.  Follow-Up: At [facility], you and your health needs are our priority.  As part of our continuing mission to provide you with exceptional heart care, we have created designated Provider Care Teams.  These Care Teams include your primary Cardiologist (physician) and Advanced Practice Providers (APPs -  Physician Assistants and Nurse Practitioners) who all work together to provide you with the care you need, when you need it.  We recommend signing up for the patient portal called "MyChart".  Sign up information is provided on this After Visit Summary.  MyChart is used to connect with patients for Virtual Visits (Telemedicine).  Patients are able to view lab/test results, encounter notes, upcoming appointments, etc.  Non-urgent messages can be sent to your provider as well.   To learn more about what you can  do with MyChart, go to [email or web address].    Your next appointment:   3-4 month(s)  Provider:   Kirk Ruths, MD     Other Instructions We have referred you to Rheumatology. They will give you a call to get scheduled.       Provider Referral Exercise Program (P.R.E.P.)      12 Weeks to Wellness       What is included in the PREP?  --Health Coaching and personalized exercise prescription --Full Membership to participating [facility] for the 12 weeks --Pre- and Post-consultations to assess progress and formulate an exercise plan for       continuation of exercise   What is my investment?  Your cost is $100 (reg. $144). This includes full membership privileges to [facility] in [place] and across the country. If you complete the post-assessment visit, any applicable enrollment costs will be waived if you decide to join the [facility].   Who will benefit from PREP?  People with challenges, including (but not limited to): ---Low back pain ---Arthritis ---Hypertension ---Diabetes ---Obesity ---Joint Replacement ---Neuromuscular Disorders ---Cancer Recovery ---Weight Loss ---Many others (as determined by provider)   Get Started Today! Ask your healthcare provider to fill out a Healthcare Provider Referral for Exercise form. Be sure to turn it in before you leave the office and send/fax/email it directly to the Wellness RN to schedule your Initial Consultation. If you have family or friends that would also benefit, please share this referral with them.   Contacts:  [facility] [phone]    Winifred Olive, Wellness RN  [phone]  [email or web address]

## 2022-10-09 NOTE — Progress Notes (Addendum)
Office Visit    Patient Name: Sheri Johnson Date of Encounter: 10/09/2022  PCP:  [facility]  Cardiologist:  Sheri Ruths, MD  Advanced Practice Provider:  No care team member to display Electrophysiologist:  Sheri Peru, MD      Chief Complaint    Sheri Johnson is a 37 y.o. female presents today for preoperative cleareance   Past Medical History    Past Medical History:  Diagnosis Date   Abortion 11/14/2021   Anxiety    Asthma    Frequent headaches    Hypertension    Hypothyroid    SVT (supraventricular tachycardia)    Past Surgical History:  Procedure Laterality Date   CESAREAN SECTION     x2   LAPAROSCOPIC APPENDECTOMY N/A 12/14/2016   Procedure: APPENDECTOMY LAPAROSCOPIC;  Surgeon: Jovita Kussmaul, MD;  Location: [place];  Service: General;  Laterality: N/A;   SVT ABLATION N/A 03/31/2022   Procedure: SVT ABLATION;  Surgeon: Evans Lance, MD;  Location: [facility];  Service: Cardiovascular;  Laterality: N/A;    Allergies  Allergies  Allergen Reactions   Metoprolol Other (See Comments)    dizziness   Cipro [Ciprofloxacin Hcl] Palpitations   Prednisone Palpitations    History of Present Illness    Sheri Johnson is a 37 y.o. female with a hx of SVT, HTN, hypothyroidism, anxiety last seen 07/27/22 by Almyra Deforest, PA.  She had COVID 08/2020 and subsequently evaluated for palpitations 01/2021 with ED vsiit SVT HR >200bpm with diffuse ST depression. Previously treated for inappropriate sinus tachycardia treated with beta locker. Echo 01/2022 normal LVEF. 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 Coronary CT A9/2023 coronary calcium score 0, dilated main pulmonary artery at 72mm suggestive of pulmonary hypertension. Seen 05/2022 recommend to gradually decrease Atenolol and utilize PRN. ED visit 06/2022 chest pain radiating to left arm and jaw as well as dizziness,  palpitations. Left without being seen. PFT 06/13/22 normal. ED 07/19/22 with arm numbness and palpitations with EKG revealing ST which improved with vagal maneuver. Last seen 07/27/22 by Almyra Deforest, PA with episodes of shortness of breath, dizziness with exertion and fatigue, elevated heart rates. She was recommended to split Atenolol to 25mg  BID.   She presents today for preoperative clearance for colonoscopy/endoscopy. Notes she has a lot of digestive issues and is worried about anesthesia. She has a myriad of concerns today. Understandably frustrated by trying to lose weight but feeling unsuccessful. She's has had rashes popping up and is following with primary care provider. Still with left sided chest pain. Radiates to under her left arm pit and left jaw. Pain happens at rest or with activity. Notes she is having pain in her bilateral legs with activity and then starts feeling lightheaded and dizzy. Describes sensation of palpitations with heart skipping beats or beating more forcefully. Notes she did not tolerate prior ZIO monitor well due to irritation of skin. Also notes bilateral lower extremity edema which is worse at end of the day. Has had to rely on more of her anxiety medicines as she feels understandably worried by her symptoms.   Her endocrinologist has recommended referral to neurology and  rheumatology. Seeing neurologist tomorrow for migraines with normal brain scans.    EKGs/Labs/Other Studies Reviewed:   The following studies were reviewed today: Cardiac Studies & Procedures       ECHOCARDIOGRAM  ECHOCARDIOGRAM COMPLETE 01/31/2022  Narrative ECHOCARDIOGRAM REPORT  Patient Name:   Sheri Johnson Date of Exam: 01/31/2022 Medical Rec #:  [id number]       Height:       69.0 in Accession #:    [id number]      Weight:       246.3 lb Date of Birth:  Jun 20, 1986       BSA:          2.257 m Patient Age:    24 years        BP:           133/75 mmHg Patient Gender: F                HR:           78 bpm. Exam Location:  Inpatient  Procedure: 2D Echo, Cardiac Doppler and Color Doppler  Indications:    Chest pain  History:        Patient has prior history of Echocardiogram examinations. Risk Factors:Hypertension.  Sonographer:    Jyl Heinz Referring Phys: [id number] TIFFANY [place]  IMPRESSIONS   1. Left ventricular ejection fraction, by estimation, is 60 to 65%. The left ventricle has normal function. The left ventricle has no regional wall motion abnormalities. Left ventricular diastolic parameters were normal. 2. Right ventricular systolic function is normal. The right ventricular size is normal. There is normal pulmonary artery systolic pressure. The estimated right ventricular systolic pressure is 26.8 mmHg. 3. The mitral valve is normal in structure. No evidence of mitral valve regurgitation. No evidence of mitral stenosis. 4. The aortic valve is tricuspid. Aortic valve regurgitation is not visualized. No aortic stenosis is present. 5. The inferior vena cava is normal in size with greater than 50% respiratory variability, suggesting right atrial pressure of 3 mmHg.  FINDINGS Left Ventricle: Left ventricular ejection fraction, by estimation, is 60 to 65%. The left ventricle has normal function. The left ventricle has no regional wall motion abnormalities. The left ventricular internal cavity size was normal in size. There is no left ventricular hypertrophy. Left ventricular diastolic parameters were normal. Normal left ventricular filling pressure.  Right Ventricle: The right ventricular size is normal. No increase in right ventricular wall thickness. Right ventricular systolic function is normal. There is normal pulmonary artery systolic pressure. The tricuspid regurgitant velocity is 2.06 m/s, and with an assumed right atrial pressure of 3 mmHg, the estimated right ventricular systolic pressure is 34.1 mmHg.  Left Atrium: Left atrial size was normal in  size.  Right Atrium: Right atrial size was normal in size.  Pericardium: There is no evidence of pericardial effusion.  Mitral Valve: The mitral valve is normal in structure. No evidence of mitral valve regurgitation. No evidence of mitral valve stenosis.  Tricuspid Valve: The tricuspid valve is normal in structure. Tricuspid valve regurgitation is trivial. No evidence of tricuspid stenosis.  Aortic Valve: The aortic valve is tricuspid. Aortic valve regurgitation is not visualized. No aortic stenosis is present. Aortic valve peak gradient measures 5.0 mmHg.  Pulmonic Valve: The pulmonic valve was normal in structure. Pulmonic valve regurgitation is not visualized. No evidence of pulmonic stenosis.  Aorta: The aortic root is normal in size and structure.  Venous: The inferior vena cava is normal in size with greater than 50% respiratory variability, suggesting right atrial pressure of 3 mmHg.  IAS/Shunts: No atrial level shunt detected by color flow Doppler.   LEFT VENTRICLE PLAX 2D LVIDd:         4.80 cm  Diastology LVIDs:         3.00 cm      LV e' medial:    8.49 cm/s LV PW:         1.30 cm      LV E/e' medial:  6.1 LV IVS:        1.10 cm      LV e' lateral:   10.60 cm/s LVOT diam:     2.20 cm      LV E/e' lateral: 4.8 LV SV:         70 LV SV Index:   31 LVOT Area:     3.80 cm  LV Volumes (MOD) LV vol d, MOD A2C: 89.8 ml LV vol d, MOD A4C: 115.0 ml LV vol s, MOD A2C: 31.9 ml LV vol s, MOD A4C: 41.9 ml LV SV MOD A2C:     57.9 ml LV SV MOD A4C:     115.0 ml LV SV MOD BP:      66.5 ml  RIGHT VENTRICLE             IVC RV Basal diam:  2.70 cm     IVC diam: 1.70 cm RV Mid diam:    2.20 cm RV S prime:     10.30 cm/s TAPSE (M-mode): 2.5 cm  LEFT ATRIUM             Index        RIGHT ATRIUM           Index LA diam:        3.50 cm 1.55 cm/m   RA Area:     12.50 cm LA Vol (A2C):   41.4 ml 18.34 ml/m  RA Volume:   25.20 ml  11.17 ml/m LA Vol (A4C):   42.2 ml 18.70  ml/m LA Biplane Vol: 42.0 ml 18.61 ml/m AORTIC VALVE AV Area (Vmax): 3.02 cm AV Vmax:        112.00 cm/s AV Peak Grad:   5.0 mmHg LVOT Vmax:      88.90 cm/s LVOT Vmean:     65.800 cm/s LVOT VTI:       0.185 m  AORTA Ao Root diam: 3.40 cm Ao Asc diam:  3.00 cm  MITRAL VALVE               TRICUSPID VALVE MV Area (PHT): 3.42 cm    TR Peak grad:   17.0 mmHg MV Decel Time: 222 msec    TR Vmax:        206.00 cm/s MV E velocity: 51.40 cm/s MV A velocity: 38.60 cm/s  SHUNTS MV E/A ratio:  1.33        Systemic VTI:  0.18 m Systemic Diam: 2.20 cm  Mihai Croitoru MD Electronically signed by Sanda Klein MD Signature Date/Time: 01/31/2022/3:21:53 PM    Final    MONITORS  LONG TERM MONITOR (3-14 DAYS) 11/18/2021  Narrative Patch Wear Time:  1 days and 10 hours (2023-03-25T12:18:43-0400 to 2023-03-26T22:20:23-0400)  Patient had a min HR of 60 bpm, max HR of 155 bpm, and avg HR of 91 bpm. Predominant underlying rhythm was Sinus Rhythm. Isolated SVEs were rare (<1.0%), and no SVE Couplets or SVE Triplets were present. Isolated VEs were rare (<1.0%), and no VE Couplets or VE Triplets were present.  Summary and conclusions: Normal monitor   CT SCANS  CT CORONARY MORPH W/CTA COR W/SCORE 05/04/2022  Addendum 05/04/2022 10:37 AM ADDENDUM REPORT: 05/04/2022 10:35  ADDENDUM: The following  report is an over-read performed by radiologist Dr. Dahlia Bailiff of [facility] on May 04 2022. This over-read does not include interpretation of cardiac or coronary anatomy or pathology. The coronary calcium score/coronary CTA interpretation by the cardiologist is attached.  COMPARISON:  Chest CT January 30, 2022  FINDINGS: Vascular: No acute non-cardiac vascular finding.  Mediastinum/Nodes: No pathologically enlarged mediastinal, or hilar lymph nodes in the visualized portions of the thorax. Evaluated portions of the esophagus are grossly unremarkable.  Lungs/Pleura:  Within the visualized portions of the thorax there are no suspicious appearing pulmonary nodules or masses, there is no acute consolidative airspace disease, no pleural effusions and no pneumothorax  Upper Abdomen: Visualized portions of the upper abdomen are unremarkable.  Musculoskeletal: No acute osseous abnormality.  IMPRESSION: No significant incidental noncardiac finding noted.   Electronically Signed By: Dahlia Bailiff M.D. On: 05/04/2022 10:35  Narrative HISTORY: 37 yo female with chest pain/anginal equiv, intermediate CAD risk, not treadmill candidate Dyspnea on exertion (DOE)  EXAM: Cardiac/Coronary CTA  TECHNIQUE: The patient was scanned on a [employer].  PROTOCOL: A 90 kV prospective scan was triggered in the descending thoracic aorta at 111 HU's. Axial non-contrast 3 mm slices were carried out through the heart. The data set was analyzed on a dedicated work station and scored using the [place]. Gantry rotation speed was 250 msecs and collimation was .6 mm. Beta blockade and 0.8 mg of sl NTG was given. The 3D data set was reconstructed in 5% intervals of the 35-75 % of the R-R cycle. Diastolic phases were analyzed on a dedicated work station using MPR, MIP and VRT modes. The patient received 95 ml OMNIPAQUE of contrast.  FINDINGS: Quality: Fair, attenuation artifact, HR 75  Coronary calcium score: The patient's coronary artery calcium score is 0, which places the patient in the 0 percentile.  Coronary arteries: Normal coronary origins.  Right dominance.  Right Coronary Artery: Dominant.  Normal vessel.  Left Main Coronary Artery: Normal. Bifurcates into the LAD and LCx arteries.  Left Anterior Descending Coronary Artery: Large anterior artery, reaches around the apex. No disease. 2 diagonal branches without disease.  Left Circumflex Artery: AV groove vessel, no disease. Large OM branch without disease.  Aorta: Normal size, 31  mm at the mid ascending aorta (level of the PA bifurcation) measured double oblique. No calcifications. No dissection.  Aortic Valve: Trileaflet. No calcifications.  Other findings:  Normal pulmonary vein drainage into the left atrium.  Normal left atrial appendage without a thrombus.  Dilated main pulmonary artery to 31 mm, suggestive of pulmonary hypertension.  IMPRESSION: 1. No evidence of CAD, CADRADS = 0.  2. Coronary calcium score of 0. This was 0 percentile for age and sex matched control.  3. Normal coronary origin with right dominance.  4. Dilated main pulmonary artery to 31 mm, suggestive of pulmonary hypertension.  5. Consider non-coronary causes of chest pain.  Electronically Signed: By: Pixie Casino M.D. On: 05/04/2022 10:13           EKG:  EKG is ordered today.  The ekg ordered today demonstrates NSR 87 bpm with no acute ST/T wave changes.   Recent Labs: 01/30/2022: B Natriuretic Peptide 24.5 02/11/2022: Magnesium 1.9 04/21/2022: TSH 7.761 07/19/2022: ALT 30 08/05/2022: BUN 14; Creatinine, Ser 0.92; Hemoglobin 13.7; Platelets 317; Potassium 3.8; Sodium 139  Recent Lipid Panel No results found for: "CHOL", "TRIG", "HDL", "CHOLHDL", "VLDL", "LDLCALC", "LDLDIRECT"  Home Medications   Current Meds  Medication  Sig   albuterol (VENTOLIN HFA) 108 (90 Base) MCG/ACT inhaler Inhale 2 puffs into the lungs every 6 (six) hours as needed for wheezing or shortness of breath.   ALPRAZolam (XANAX XR) 0.5 MG 24 hr tablet Take by mouth.   Ascorbic Acid (VITAMIN C) 1000 MG tablet Take 1,000 mg by mouth daily.   atenolol (TENORMIN) 25 MG tablet Take 1 tablet (25 mg total) by mouth 2 (two) times daily.   b complex vitamins capsule Take 1 capsule by mouth daily.   cetirizine (ZYRTEC) 10 MG tablet Take 10 mg by mouth 2 (two) times daily.   clobetasol (TEMOVATE) 0.05 % external solution Apply 1 Application topically at bedtime. Apply to scalp   cyclobenzaprine (FLEXERIL)  10 MG tablet Take 10 mg by mouth in the morning and at bedtime.   Digestive Enzymes CAPS Take 1 capsule by mouth 2 (two) times daily before a meal.   doxycycline (VIBRAMYCIN) 100 MG capsule Take 1 capsule (100 mg total) by mouth 2 (two) times daily.   fluconazole (DIFLUCAN) 150 MG tablet Take 1 tablet (150 mg total) by mouth daily. Take 1 tablet by mouth once in the night repeat in 3 days as needed   fluticasone (FLONASE) 50 MCG/ACT nasal spray Place 1 spray into both nostrils daily as needed for allergies or rhinitis.   fluticasone (FLOVENT HFA) 44 MCG/ACT inhaler Inhale 1 puff into the lungs 2 (two) times daily.   ibuprofen (ADVIL) 800 MG tablet Take 800 mg by mouth every 8 (eight) hours as needed for moderate pain.   levothyroxine (SYNTHROID) 100 MCG tablet Take 100 mcg by mouth every morning.   Magnesium 400 MG CAPS Take 400 mg by mouth daily as needed (Low Magnesium).   OVER THE COUNTER MEDICATION Take 2 tablets by mouth daily. Medication: Beet Root Supplement   OVER THE COUNTER MEDICATION Take 2 capsules by mouth at bedtime. Medication: Clear Lungs   potassium chloride SA (KLOR-CON M) 20 MEQ tablet Take 1 tablet (20 mEq total) by mouth daily.   Probiotic Product (PROBIOTIC DAILY PO) Take 2 each by mouth at bedtime.   TURMERIC CURCUMIN PO Take 2 capsules by mouth daily.     Review of Systems      All other systems reviewed and are otherwise negative except as noted above.  Physical Exam    VS:  BP 122/76   Pulse 87   Ht 5\' 9"  (1.753 m)   Wt 245 lb (111.1 kg)   BMI 36.18 kg/m  , BMI Body mass index is 36.18 kg/m.  Wt Readings from Last 3 Encounters:  10/09/22 245 lb (111.1 kg)  08/05/22 238 lb (108 kg)  07/27/22 245 lb 6.4 oz (111.3 kg)     GEN: Well nourished, overweight, well developed, in no acute distress. HEENT: normal. Neck: Supple, no JVD, carotid bruits, or masses. Cardiac: RRR, no murmurs, rubs, or gallops. No clubbing, cyanosis, edema.  Radials/PT 2+ and equal  bilaterally.  Respiratory:  Respirations regular and unlabored, clear to auscultation bilaterally. GI: Soft, nontender, nondistended. MS: No deformity or atrophy. Skin: Warm and dry, no rash. Neuro:  Strength and sensation are intact. Psych: Normal affect.  Assessment & Plan    Preop clearance - Pending endoscopy and colonoscopy as workup for digestive issues. She has a myriad of concerns today. Able to achieve >4 METS. However, given LE edema and dyspnea plan for echocardiogram prior to providing clearance to rule out HF.  Addendum 10/23/22: Echo 10/23/22 normal LVEF no  significant valvular abnormalities. No cardiac etiology of LE edema or dyspnea. Per AHA/ACC guidelines, she is deemed acceptable risk for the planned procedure without additional cardiovascular testing. Will route to surgical team so they are aware.      Atypical chest pain - 04/2022 Coronary calcium score of 0. Chest pain atypical as occurs at rest or with activity. EKG no acute ST/T wave changes. No indication for further ischemic workup.  DOE / LE edema - Anticipate LE edema related to venous insufficiency and DOE due to obesity. To rule out heart failure as contributory, plan for echocardiogram.   Rash / Joint swelling - Being worked up by primary care due ot hand, knee, ankle pain with erythema. Lupus negative. ANA, ESR, CRP by PCP 09/2022 were unremarkable. She has already been referred to rheumatology by PCP.   Hypothyroidism due to Hashimoto's thyroiditis - Follows with endocrinology.   Palpitations / SVT - s/p ablation. Defer repeat monitor as she only previously tolerated for <2 days due to adhesive. Encouraged to avoid caffeine, manage stress/anxiety well, stay hydrated. Continue Atenolol.   Lower extremity pain - Bilateral leg pain with walking concerning for claudication. Palpable pulses on exam. Plan for ABI.   Obesity - Weight loss via diet and exercise encouraged. Discussed the impact being overweight would have  on cardiovascular risk. Refer to [facility].  Addendum 10/14/22: Was notified [facility] presently only taking [facility] patients, will instead provide her information for Right Start Exercise program at [facility].        Disposition: Follow up  in 3-4 months  with Sheri Ruths, MD or APP.  Signed, Sheri Dubonnet, NP 10/09/2022, 3:08 PM [place]

## 2022-10-10 ENCOUNTER — Encounter: Payer: Self-pay | Admitting: Psychiatry

## 2022-10-10 ENCOUNTER — Ambulatory Visit: Payer: 59 | Admitting: Psychiatry

## 2022-10-10 VITALS — BP 131/74 | HR 79 | Ht 69.0 in | Wt 242.4 lb

## 2022-10-10 DIAGNOSIS — R5383 Other fatigue: Secondary | ICD-10-CM | POA: Diagnosis not present

## 2022-10-10 DIAGNOSIS — G43009 Migraine without aura, not intractable, without status migrainosus: Secondary | ICD-10-CM | POA: Diagnosis not present

## 2022-10-10 DIAGNOSIS — M5412 Radiculopathy, cervical region: Secondary | ICD-10-CM | POA: Diagnosis not present

## 2022-10-10 DIAGNOSIS — R21 Rash and other nonspecific skin eruption: Secondary | ICD-10-CM

## 2022-10-10 MED ORDER — EMGALITY 120 MG/ML ~~LOC~~ SOAJ: 2.0000 | Freq: Once | SUBCUTANEOUS | 0 refills | Status: AC

## 2022-10-10 MED ORDER — NURTEC 75 MG PO TBDP: 75.0000 mg | ORAL_TABLET | ORAL | 6 refills | Status: DC | PRN

## 2022-10-10 MED ORDER — EMGALITY 120 MG/ML ~~LOC~~ SOAJ: 1.0000 | SUBCUTANEOUS | 6 refills | Status: DC
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
## 2022-10-11 ENCOUNTER — Other Ambulatory Visit: Payer: Self-pay | Admitting: Psychiatry

## 2022-10-11 ENCOUNTER — Telehealth: Payer: Self-pay | Admitting: Psychiatry

## 2022-10-11 LAB — IRON,TIBC AND FERRITIN PANEL
Ferritin: 15 ng/mL (ref 15–150)
Iron Saturation: 14 % — ABNORMAL LOW (ref 15–55)
Iron: 55 ug/dL (ref 27–159)
Total Iron Binding Capacity: 391 ug/dL (ref 250–450)
UIBC: 336 ug/dL (ref 131–425)

## 2022-10-11 LAB — LYME DISEASE SEROLOGY W/REFLEX: Lyme Total Antibody EIA: NEGATIVE

## 2022-10-11 MED ORDER — FERROUS SULFATE 325 (65 FE) MG PO TBEC: 325.0000 mg | DELAYED_RELEASE_TABLET | Freq: Every day | ORAL | 6 refills | Status: DC

## 2022-10-11 NOTE — Telephone Encounter (Signed)
Referral for pain clinic fax to [facility]. Phone: [phone], Fax: [phone].

## 2022-10-14 ENCOUNTER — Emergency Department (HOSPITAL_BASED_OUTPATIENT_CLINIC_OR_DEPARTMENT_OTHER): Payer: 59

## 2022-10-14 ENCOUNTER — Emergency Department (HOSPITAL_BASED_OUTPATIENT_CLINIC_OR_DEPARTMENT_OTHER)
Admission: EM | Admit: 2022-10-14 | Discharge: 2022-10-15 | Disposition: A | Discharge status: Home / Self Care | Payer: 59 | Attending: Emergency Medicine | Admitting: Emergency Medicine

## 2022-10-14 ENCOUNTER — Other Ambulatory Visit: Payer: Self-pay

## 2022-10-14 ENCOUNTER — Encounter (HOSPITAL_BASED_OUTPATIENT_CLINIC_OR_DEPARTMENT_OTHER): Payer: Self-pay | Admitting: Family

## 2022-10-14 DIAGNOSIS — R1030 Lower abdominal pain, unspecified: Secondary | ICD-10-CM | POA: Insufficient documentation

## 2022-10-14 DIAGNOSIS — R079 Chest pain, unspecified: Secondary | ICD-10-CM | POA: Diagnosis not present

## 2022-10-14 LAB — BASIC METABOLIC PANEL
Anion gap: 8 (ref 5–15)
BUN: 11 mg/dL (ref 6–20)
CO2: 25 mmol/L (ref 22–32)
Calcium: 8.8 mg/dL — ABNORMAL LOW (ref 8.9–10.3)
Chloride: 102 mmol/L (ref 98–111)
Creatinine, Ser: 0.94 mg/dL (ref 0.44–1.00)
GFR, Estimated: >60 mL/min (ref >60)
Glucose, Bld: 133 mg/dL — ABNORMAL HIGH (ref 70–99)
Potassium: 3 mmol/L — ABNORMAL LOW (ref 3.5–5.1)
Sodium: 135 mmol/L (ref 135–145)

## 2022-10-14 LAB — CBC
HCT: 39.4 % (ref 36.0–46.0)
Hemoglobin: 13.5 g/dL (ref 12.0–15.0)
MCH: 28.5 pg (ref 26.0–34.0)
MCHC: 34.3 g/dL (ref 30.0–36.0)
MCV: 83.3 fL (ref 80.0–100.0)
Platelets: 300 10*3/uL (ref 150–400)
RBC: 4.73 MIL/uL (ref 3.87–5.11)
RDW: 12.7 % (ref 11.5–15.5)
WBC: 10.7 10*3/uL — ABNORMAL HIGH (ref 4.0–10.5)
nRBC: 0 % (ref 0.0–0.2)

## 2022-10-14 LAB — TROPONIN I (HIGH SENSITIVITY): Troponin I (High Sensitivity): 2 ng/L (ref <18)

## 2022-10-14 NOTE — ED Triage Notes (Signed)
Pt here for chest pain x1 hour that came on suddenly w/ radiation to L arm. Pt describes pain as a pressure. Pt reports all day she has had abdominal cramps, nausea, and dysphagia. Pt reports feeling lightheaded.

## 2022-10-15 ENCOUNTER — Telehealth: Payer: Self-pay | Admitting: Cardiology

## 2022-10-15 DIAGNOSIS — E876 Hypokalemia: Secondary | ICD-10-CM

## 2022-10-15 LAB — URINALYSIS, ROUTINE W REFLEX MICROSCOPIC
Bilirubin Urine: NEGATIVE
Glucose, UA: NEGATIVE mg/dL
Hgb urine dipstick: NEGATIVE
Ketones, ur: NEGATIVE mg/dL
Leukocytes,Ua: NEGATIVE
Nitrite: NEGATIVE
Protein, ur: NEGATIVE mg/dL
Specific Gravity, Urine: 1.025 (ref 1.005–1.030)
pH: 6.5 (ref 5.0–8.0)

## 2022-10-15 LAB — LIPASE, BLOOD: Lipase: 33 U/L (ref 11–51)

## 2022-10-15 LAB — HEPATIC FUNCTION PANEL
ALT: 31 U/L (ref 0–44)
AST: 28 U/L (ref 15–41)
Albumin: 4.2 g/dL (ref 3.5–5.0)
Alkaline Phosphatase: 47 U/L (ref 38–126)
Bilirubin, Direct: <0.1 mg/dL (ref 0.0–0.2)
Total Bilirubin: 0.3 mg/dL (ref 0.3–1.2)
Total Protein: 7.5 g/dL (ref 6.5–8.1)

## 2022-10-15 LAB — PREGNANCY, URINE: Preg Test, Ur: NEGATIVE

## 2022-10-15 LAB — TROPONIN I (HIGH SENSITIVITY): Troponin I (High Sensitivity): 2 ng/L (ref <18)

## 2022-10-15 NOTE — ED Provider Notes (Signed)
[facility]  Provider Note  CSN: [id number] Arrival date & time: 10/14/22 2235  History Chief Complaint  Patient presents with   Chest Pain    Sheri Johnson is a 37 y.o. female here with multiple complaints. Has had several unrelated issues recently, but the reason for her ED visit tonight is lower abdominal pain worsening over the last 4 days, associated with increased urination, but no V/D. She reports stools have been green. Also began having chest pain radiating into L arm earlier this evening. She has a history of IBS, but typically does not have abdominal pain. She has prior history of SVT and frequent ED visits for chest pains. Last saw cardiology earlier this week and no changes made then. Had a Coronary calcium score of 0 on CT in Sept 2023.    Home Medications Prior to Admission medications   Medication Sig Start Date End Date Taking? Authorizing Provider  ferrous sulfate 325 (65 FE) MG EC tablet Take 1 tablet (325 mg total) by mouth daily with breakfast. 10/11/22   Genia Harold, MD  albuterol (VENTOLIN HFA) 108 (90 Base) MCG/ACT inhaler Inhale 2 puffs into the lungs every 6 (six) hours as needed for wheezing or shortness of breath.    [provider]  ALPRAZolam (XANAX XR) 0.5 MG 24 hr tablet Take by mouth. 07/25/22   [provider]  Ascorbic Acid (VITAMIN C) 1000 MG tablet Take 1,000 mg by mouth daily.    [provider]  atenolol (TENORMIN) 25 MG tablet Take 1 tablet (25 mg total) by mouth 2 (two) times daily. 07/27/22 10/25/22  Almyra Deforest, PA  b complex vitamins capsule Take 1 capsule by mouth daily.    [provider]  cetirizine (ZYRTEC) 10 MG tablet Take 10 mg by mouth 2 (two) times daily.    [provider]  clobetasol (TEMOVATE) 0.05 % external solution Apply 1 Application topically at bedtime. Apply to scalp 02/27/22   [provider]  cyclobenzaprine (FLEXERIL) 10 MG  tablet Take 10 mg by mouth in the morning and at bedtime. 07/07/21   [provider]  Digestive Enzymes CAPS Take 1 capsule by mouth 2 (two) times daily before a meal.    [provider]  doxycycline (VIBRAMYCIN) 100 MG capsule Take 1 capsule (100 mg total) by mouth 2 (two) times daily. Patient not taking: Reported on 10/10/2022 09/07/22   Melynda Ripple, NP  fluconazole (DIFLUCAN) 150 MG tablet Take 1 tablet (150 mg total) by mouth daily. Take 1 tablet by mouth once in the night repeat in 3 days as needed Patient not taking: Reported on 10/10/2022 09/07/22   Melynda Ripple, NP  fluticasone [facility]) 50 MCG/ACT nasal spray Place 1 spray into both nostrils daily as needed for allergies or rhinitis.    [provider]  fluticasone (FLOVENT HFA) 44 MCG/ACT inhaler Inhale 1 puff into the lungs 2 (two) times daily.    [provider]  Galcanezumab-gnlm (EMGALITY) 120 MG/ML SOAJ Inject 1 Pen into the skin every 30 (thirty) days. 10/10/22   Genia Harold, MD  ibuprofen (ADVIL) 800 MG tablet Take 800 mg by mouth every 8 (eight) hours as needed for moderate pain.    [provider]  levothyroxine (SYNTHROID) 100 MCG tablet Take 100 mcg by mouth every morning.    [provider]  Magnesium 400 MG CAPS Take 400 mg by mouth daily as needed (Low Magnesium).  [provider]  OVER THE COUNTER MEDICATION Take 2 tablets by mouth daily. Medication: Beet Root Supplement    [provider]  OVER THE COUNTER MEDICATION Take 2 capsules by mouth at bedtime. Medication: Clear Lungs    [provider]  potassium chloride SA (KLOR-CON M) 20 MEQ tablet Take 1 tablet (20 mEq total) by mouth daily. 12/14/21   Richardson Dopp T, PA-C  Probiotic Product (PROBIOTIC DAILY PO) Take 2 each by mouth at bedtime.    [provider]  Rimegepant Sulfate (NURTEC) 75 MG TBDP Take 1 tablet (75 mg total) by mouth as needed (migraine). Max dose 1 pill in 24 hours  10/10/22   Genia Harold, MD  TURMERIC CURCUMIN PO Take 2 capsules by mouth daily.    [provider]     Allergies    Metoprolol, Cipro [ciprofloxacin hcl], and Prednisone   Review of Systems   Review of Systems Please see HPI for pertinent positives and negatives  Physical Exam BP (!) 140/71 (BP Location: Right Arm)   Pulse 96   Temp 98 F (36.7 C) (Oral)   Resp 17   Ht '5\' 9"'$  (1.753 m)   Wt 108.9 kg   LMP 09/30/2022 (Exact Date)   SpO2 99%   BMI 35.44 kg/m   Physical Exam Vitals and nursing note reviewed.  Constitutional:      Appearance: Normal appearance.  HENT:     Head: Normocephalic and atraumatic.     Nose: Nose normal.     Mouth/Throat:     Mouth: Mucous membranes are moist.  Eyes:     Extraocular Movements: Extraocular movements intact.     Conjunctiva/sclera: Conjunctivae normal.  Cardiovascular:     Rate and Rhythm: Normal rate.  Pulmonary:     Effort: Pulmonary effort is normal.     Breath sounds: Normal breath sounds.  Abdominal:     General: Abdomen is flat.     Palpations: Abdomen is soft.     Tenderness: There is no abdominal tenderness. There is no guarding.  Musculoskeletal:        General: No swelling. Normal range of motion.     Cervical back: Neck supple.  Skin:    General: Skin is warm and dry.  Neurological:     General: No focal deficit present.     Mental Status: She is alert.  Psychiatric:        Mood and Affect: Mood normal.     ED Results / Procedures / Treatments   EKG EKG Interpretation  Date/Time:  Saturday October 14 2022 22:42:57 EST Ventricular Rate:  125 PR Interval:  144 QRS Duration: 87 QT Interval:  330 QTC Calculation: 476 R Axis:   72 Text Interpretation: Sinus tachycardia Probable left atrial enlargement Inferior infarct, age indeterminate Lateral leads are also involved No significant change since last tracing Confirmed by Calvert Cantor [phone]) on 10/15/2022 12:12:48  AM  Procedures Procedures  Medications Ordered in the ED Medications - No data to display  Initial Impression and Plan  Patient here with multiple unrelated complaints. Her primary reason for coming to the ED tonight was the onset of chest pain a short time prior to arrival but also requesting evaluation for her lower abdominal pain. Her exam is reassuring. No focal abdominal tenderness. Will add LFT and Lipase as well as UA to her labs done in triage. She has normal CBC and BMP. Initial Trop is normal. I personally viewed the images from radiology studies  and agree with radiologist interpretation: CXR is clear  ED Course   Clinical Course as of 10/15/22 0325  Sun Oct 15, 2022  0042 HR has improved without intervention.  [CS]  0059 LFTs and Lipase are normal.  [CS]  0145 UA is normal. HCG is neg.  [CS]  0309 Repeat Trop remains normal. Patient reassured no signs of emergent process. Vitals are improved with rest. No indication for additional imaging or ED workup at this time. Recommend continued outpatient management with PCP, GI and Cardiology for her various concerns. RTED for any other issues.  [CS]    Clinical Course User Index [CS] Truddie Hidden, MD     MDM Rules/Calculators/A&P Medical Decision Making Problems Addressed: Lower abdominal pain: acute illness or injury Nonspecific chest pain: acute illness or injury  Amount and/or Complexity of Data Reviewed Labs: ordered. Decision-making details documented in ED Course. Radiology: ordered and independent interpretation performed. Decision-making details documented in ED Course. ECG/medicine tests: ordered and independent interpretation performed. Decision-making details documented in ED Course.     Final Clinical Impression(s) / ED Diagnoses Final diagnoses:  Nonspecific chest pain  Lower abdominal pain    Rx / DC Orders ED Discharge Orders     None        Truddie Hidden, MD 10/15/22 [id number]

## 2022-10-15 NOTE — Telephone Encounter (Signed)
Patient called the answering service this afternoon concerned that her potassium was low.   Patient had gone to the [facility] emergency department yesterday 3/9 concerned about palpitations and chest pain. She does have a history of SVT, and reports that when she goes into SVT she develops chest pain. The chest pain she had in the ED was consistent with her prior episodes of SVT. In the ED, hsTn was 2, 2. CXR showed no active cardiopulmonary disease. EKG showed sinus tachycardia with HR 125 BPM. K was 3.0. Patient was discharged from the ED.   Today, patient could see on her mychart that her K was 3.0 in the ED. This was concerning to the patient because she often develops SVT when her potassium is low, and she even takes a K supplement at home. Patient is on 20 mEq potassium daily at home. Asked if she could take additional Potassium at home. I instructed her to take an additional tablet of potassium (for a total of 40 mEq daily) for the next 3 days. I asked her to come to the office on Wednesday for repeat BMP. Patient has been having frequent urination recently and plans to see her PCP this week. If she gets an appointment with her PCP, she can have BMP drawn there. If not, she will go to our [place] for labs. I ordered BMP  Patient reports that she has continued to have some chest pain and palpitations since being discharged from the ED yesterday. Her symptoms have been constant since before going to the ED, and her symptoms today feel the same as what she felt in the ED. Reports that these symptoms are similar to what she has when she has SVT, and she has been having the same symptoms intermittently since she was first diagnosed with SVT. She reports that her symptoms are actually improving and are better/less severe today. She had a coronary CT in 04/2022 that showed a coronary calcium score of 0 and no evidence of CAD.   I instructed patient that if her chest pain or palpitations  worsen, change, or persist, she is to go to the ED for evaluation. She voiced understanding. I offered to make her an appointment in our office, but she declined since she was recently seen on 3/4. Patient knows that she can call our office to make an appointment at any time.   Margie Billet, PA-C 10/15/2022 3:21 PM

## 2022-10-17 ENCOUNTER — Telehealth: Payer: Self-pay | Admitting: Cardiology

## 2022-10-17 DIAGNOSIS — Z79899 Other long term (current) drug therapy: Secondary | ICD-10-CM

## 2022-10-17 NOTE — Telephone Encounter (Signed)
  Pt said, she will have blood work to check her kidney and she wants to know if Dr. Stanford Breed can also order blood work to check her calcium and magnesium

## 2022-10-17 NOTE — Telephone Encounter (Signed)
Called pt she will have labs drawn tomorrow. No further questions at this time.

## 2022-10-18 ENCOUNTER — Other Ambulatory Visit: Payer: Self-pay

## 2022-10-18 DIAGNOSIS — Z79899 Other long term (current) drug therapy: Secondary | ICD-10-CM

## 2022-10-18 NOTE — Telephone Encounter (Signed)
Patient wants to add Folic Acid test to her lab orders.  Patient is on her way to lab to have blood drawn.

## 2022-10-18 NOTE — Telephone Encounter (Signed)
Patient is here to have lab work done per Social research officer, government. She is also requesting a folic acid to be drawn. Rn informed  front desk - unable to answer question with out Dr Stanford Breed  approval - will need to send   request.   Recommend patient  come back tomorrow if she would like to have everything  completed. Patient states she will do the listed - BMP, Magnesium today only

## 2022-10-19 LAB — BASIC METABOLIC PANEL
BUN/Creatinine Ratio: 10 (ref 9–23)
BUN: 9 mg/dL (ref 6–20)
CO2: 23 mmol/L (ref 20–29)
Calcium: 10.1 mg/dL (ref 8.7–10.2)
Chloride: 104 mmol/L (ref 96–106)
Creatinine, Ser: 0.86 mg/dL (ref 0.57–1.00)
Glucose: 97 mg/dL (ref 70–99)
Potassium: 4.6 mmol/L (ref 3.5–5.2)
Sodium: 142 mmol/L (ref 134–144)
eGFR: 88 mL/min/{1.73_m2} (ref >59)

## 2022-10-19 LAB — MAGNESIUM: Magnesium: 2 mg/dL (ref 1.6–2.3)

## 2022-10-23 ENCOUNTER — Ambulatory Visit: Payer: 59 | Admitting: Psychiatry

## 2022-10-23 ENCOUNTER — Ambulatory Visit (HOSPITAL_COMMUNITY): Payer: 59 | Attending: Internal Medicine

## 2022-10-23 DIAGNOSIS — I471 Supraventricular tachycardia, unspecified: Secondary | ICD-10-CM | POA: Diagnosis present

## 2022-10-23 DIAGNOSIS — R600 Localized edema: Secondary | ICD-10-CM | POA: Diagnosis present

## 2022-10-23 DIAGNOSIS — R0609 Other forms of dyspnea: Secondary | ICD-10-CM | POA: Diagnosis present

## 2022-10-23 DIAGNOSIS — I739 Peripheral vascular disease, unspecified: Secondary | ICD-10-CM | POA: Insufficient documentation

## 2022-10-23 DIAGNOSIS — M2540 Effusion, unspecified joint: Secondary | ICD-10-CM | POA: Insufficient documentation

## 2022-10-23 DIAGNOSIS — R002 Palpitations: Secondary | ICD-10-CM | POA: Diagnosis present

## 2022-10-23 LAB — ECHOCARDIOGRAM COMPLETE
Area-P 1/2: 3.12 cm2
S' Lateral: 3.1 cm

## 2022-10-24 ENCOUNTER — Telehealth: Payer: Self-pay | Admitting: *Deleted

## 2022-10-24 NOTE — Telephone Encounter (Signed)
Received fax that PA Emgality needed. Can you please assist? Thank you

## 2022-10-27 ENCOUNTER — Telehealth: Payer: Self-pay | Admitting: Cardiology

## 2022-10-27 DIAGNOSIS — E876 Hypokalemia: Secondary | ICD-10-CM

## 2022-10-27 NOTE — Telephone Encounter (Signed)
Patient Advocate Encounter   Received notification that prior authorization for Emgality 120MG /ML auto-injectors (migraine) is required.   PA submitted on 10/27/2022 [place] [employer] Prior Authorization Form Status is pending       Lyndel Safe, [place] Patient Advocate Specialist [place] Patient Advocate Team Direct Number: [phone]  Fax: [phone]

## 2022-10-27 NOTE — Telephone Encounter (Signed)
Patient is returning call and is requesting return call.  

## 2022-10-27 NOTE — Telephone Encounter (Signed)
Sheri Perla, MD  You6 minutes ago (10:53 AM)    Check bmet Sheri Johnson    Attempted to call patient, left message for patient to call back to office.

## 2022-10-27 NOTE — Telephone Encounter (Signed)
Returned call to patient who states she is starting to feel the same way that she felt a while back when her K was low. Patient reports she is urinating frequently and drinking lots of water. Patient reports that she feels like she is starting to get muscle cramps and weakness as well. Patient reports that previously when she had blood work done a little over a week ago she was taking 29meq of K daily, and reports since then she has been back on the 86meq. Patient feels like she may need to return to the 61meq. Advised patient that I would forward to Dr. Stanford Breed for him to review and advise.

## 2022-10-27 NOTE — Telephone Encounter (Signed)
Pt c/o medication issue:  1. Name of Medication: potassium chloride SA (KLOR-CON M) 20 MEQ tablet   2. How are you currently taking this medication (dosage and times per day)?   3. Are you having a reaction (difficulty breathing--STAT)? Yes   4. What is your medication issue? Patient states she is having muscle spasms and weakness and it may be a potassium deficiency and believes she may need to increase her dose on this med. Please advise.

## 2022-10-27 NOTE — Telephone Encounter (Signed)
Returned call to patient and advised her on need for labs. Patient requested folate as well- asked Dr. Stanford Breed- per Dr. Stanford Breed this is okay. Orders placed. Advised patient to call back to office with any issues, questions, or concerns. Patient verbalized understanding.

## 2022-10-28 LAB — BASIC METABOLIC PANEL
BUN/Creatinine Ratio: 10 (ref 9–23)
BUN: 8 mg/dL (ref 6–20)
CO2: 22 mmol/L (ref 20–29)
Calcium: 9.9 mg/dL (ref 8.7–10.2)
Chloride: 100 mmol/L (ref 96–106)
Creatinine, Ser: 0.82 mg/dL (ref 0.57–1.00)
Glucose: 86 mg/dL (ref 70–99)
Potassium: 4.5 mmol/L (ref 3.5–5.2)
Sodium: 140 mmol/L (ref 134–144)
eGFR: 93 mL/min/{1.73_m2} (ref >59)

## 2022-10-28 LAB — FOLATE: Folate: >20 ng/mL (ref >3.0)

## 2022-10-30 NOTE — Telephone Encounter (Signed)
Patient Advocate Encounter  Prior Authorization for Emgality 120MG /ML auto-injectors (migraine) has been approved through [employer].     Effective: 10-30-2022 to 11-29-2022

## 2022-10-30 NOTE — Telephone Encounter (Signed)
Insurance faxed over a form for additional information-filled out form and faxed along with clinical notes to [phone].

## 2022-11-15 ENCOUNTER — Telehealth: Payer: Self-pay | Admitting: Cardiology

## 2022-11-15 NOTE — Telephone Encounter (Signed)
Returned call to pt she states that her heart rate is fluctuating  from 60 to 95 to 74 while she is sitting and not ambulatory. She states that she is also having chest pain, nausea, fullness in her throat and feels like her heart is flip-flopping. She denies any fever. She states that she has cellulitis and is taking an antibiotic for this. She states that she has had SVT symptoms in the past and this is not that either. Her O2 sat is normal as well.She states that she also has these feelings when she is at the chiropractor. Her BP cuff broke and she has not been able to get another.  I found an appt on Friday and scheduled this for her. She will go to the ER if needed in the meantime.

## 2022-11-15 NOTE — Telephone Encounter (Signed)
STAT if HR is under 50 or over 120 (normal HR is 60-100 beats per minute)  What is your heart rate? 55-90 (Fluctuates)  Do you have a log of your heart rate readings (document readings)? Yes   Do you have any other symptoms? Chest pain, SOB

## 2022-11-16 NOTE — Progress Notes (Signed)
Cardiology Office Note:    Date:  11/17/2022   ID:  Felipe Drone, DOB [birth year], MRN [id number]  PCP:  Porfirio Oar, PA   [facility] Providers Cardiologist:  Olga Millers, MD Electrophysiologist:  Lewayne Bunting, MD {    Referring MD: Porfirio Oar, PA   Chief Complaint  Patient presents with   Palpitations     History of Present Illness:    Sheri Johnson is a 37 y.o. female with a hx of SVT  She is seen as a work in visit for palpitations, chest pain, fullness, nausea, fullness in her throat She has cellulitis and is being treated with an antibiotic   Has lots of palpitations,  irreg HR   Echo from March 18 shows normal LV function , no signficnat valvular abn.   Several weeks of issues Trouble swallowing  - has dysphagia  Chest pain ,  starts in L axilla and radiates to chest  Constant for 1 week Pain is not worse with exercise,  not with eating , drinking  Not worse with deep breath  Worse with moving left arm.   Is not able to get any regular exercise  Has numerous issues with exercise ( eye issues, leg pain )      Left Foot pain , redness    Has left facial cellulitis from piercing's   Has lots of diffuse pain       Past Medical History:  Diagnosis Date   Abortion 11/14/2021   Anxiety    Asthma    Frequent headaches    Hypertension    Hypothyroid    SVT (supraventricular tachycardia)     Past Surgical History:  Procedure Laterality Date   CESAREAN SECTION     x2   LAPAROSCOPIC APPENDECTOMY N/A 12/14/2016   Procedure: APPENDECTOMY LAPAROSCOPIC;  Surgeon: Griselda Miner, MD;  Location: [facility] OR;  Service: General;  Laterality: N/A;   SVT ABLATION N/A 03/31/2022   Procedure: SVT ABLATION;  Surgeon: Marinus Maw, MD;  Location: [facility] INVASIVE CV LAB;  Service: Cardiovascular;  Laterality: N/A;    Current Medications: Current Meds  Medication Sig   albuterol (VENTOLIN HFA) 108 (90 Base) MCG/ACT inhaler Inhale 2 puffs  into the lungs every 6 (six) hours as needed for wheezing or shortness of breath.   ALPRAZolam (XANAX XR) 0.5 MG 24 hr tablet Take by mouth.   Ascorbic Acid (VITAMIN C) 1000 MG tablet Take 1,000 mg by mouth daily.   atenolol (TENORMIN) 25 MG tablet Take 1 tablet (25 mg total) by mouth 2 (two) times daily.   b complex vitamins capsule Take 1 capsule by mouth daily.   cetirizine (ZYRTEC) 10 MG tablet Take 10 mg by mouth 2 (two) times daily.   clobetasol (TEMOVATE) 0.05 % external solution Apply 1 Application topically at bedtime. Apply to scalp   Digestive Enzymes CAPS Take 1 capsule by mouth 2 (two) times daily before a meal.   ferrous sulfate 325 (65 FE) MG EC tablet Take 1 tablet (325 mg total) by mouth daily with breakfast.   fluticasone (FLONASE) 50 MCG/ACT nasal spray Place 1 spray into both nostrils daily as needed for allergies or rhinitis.   fluticasone (FLOVENT HFA) 44 MCG/ACT inhaler Inhale 1 puff into the lungs 2 (two) times daily.   ibuprofen (ADVIL) 800 MG tablet Take 800 mg by mouth every 8 (eight) hours as needed for moderate pain.   levothyroxine (SYNTHROID) 100 MCG tablet Take 100 mcg by mouth every morning.  Magnesium 400 MG CAPS Take 400 mg by mouth daily as needed (Low Magnesium).   OVER THE COUNTER MEDICATION Take 2 tablets by mouth daily. Medication: Beet Root Supplement   OVER THE COUNTER MEDICATION Take 2 capsules by mouth at bedtime. Medication: Clear Lungs   potassium chloride SA (KLOR-CON M) 20 MEQ tablet Take 1 tablet (20 mEq total) by mouth daily.   Probiotic Product (PROBIOTIC DAILY PO) Take 2 each by mouth at bedtime.   TURMERIC CURCUMIN PO Take 2 capsules by mouth daily.     Allergies:   Metoprolol, Cephalexin, Cipro [ciprofloxacin hcl], Prednisone, and Sulfamethoxazole-trimethoprim   Social History   Socioeconomic History   Marital status: Single    Spouse name: Not on file   Number of children: 3   Years of education: Not on file   Highest education  level: Associate degree: academic program  Occupational History   Not on file  Tobacco Use   Smoking status: Never   Smokeless tobacco: Never  Vaping Use   Vaping Use: Never used  Substance and Sexual Activity   Alcohol use: Not Currently    Comment: Rare   Drug use: Never   Sexual activity: Not on file  Other Topics Concern   Not on file  Social History Narrative   Caffeine- none   Social Determinants of Health   Financial Resource Strain: Not on file  Food Insecurity: Not on file  Transportation Needs: Not on file  Physical Activity: Not on file  Stress: Not on file  Social Connections: Not on file     Family History: The patient's family history includes Hypothyroidism in her mother.  ROS:   Please see the history of present illness.     All other systems reviewed and are negative.  EKGs/Labs/Other Studies Reviewed:    The following studies were reviewed today:   EKG:   November 17, 2022: Normal sinus rhythm at 82.  Nonspecific ST and T wave changes.  Recent Labs: 01/30/2022: B Natriuretic Peptide 24.5 04/21/2022: TSH 7.761 10/14/2022: ALT 31; Hemoglobin 13.5; Platelets 300 10/18/2022: Magnesium 2.0 10/27/2022: BUN 8; Creatinine, Ser 0.82; Potassium 4.5; Sodium 140  Recent Lipid Panel No results found for: "CHOL", "TRIG", "HDL", "CHOLHDL", "VLDL", "LDLCALC", "LDLDIRECT"   Risk Assessment/Calculations:           STOP-Bang Score:  6       Physical Exam:    VS:  BP 122/72   Pulse 82   Ht 5\' 9"  (1.753 m)   Wt 245 lb 3.2 oz (111.2 kg)   SpO2 97%   BMI 36.21 kg/m     Wt Readings from Last 3 Encounters:  11/17/22 245 lb 3.2 oz (111.2 kg)  10/14/22 240 lb (108.9 kg)  10/10/22 242 lb 6.4 oz (110 kg)     GEN: moderately obese, anxioius appearing female , in no acute distress HEENT: Normal NECK: No JVD; No carotid bruits LYMPHATICS: No lymphadenopathy CARDIAC: RRR, no murmurs, rubs, gallops RESPIRATORY:  Clear to auscultation without rales, wheezing or  rhonchi  ABDOMEN: Soft, non-tender, non-distended MUSCULOSKELETAL:  No edema; No deformity  SKIN: Warm and dry NEUROLOGIC:  Alert and oriented x 3 PSYCHIATRIC:  Normal affect   ASSESSMENT:    1. SVT (supraventricular tachycardia)    PLAN:    Patient presents with numerous total body complaints that she thinks are all related to left-sided problems and therefore must be cardiac related. .  She has right facial cellulitis, left axilla pain, left foot redness  and pain, sore throat on the left side,  Is very difficult to sort out what might be her main complaint but it seems that she is somewhat concerned with palpitations.  I reassured her that these other issues will need to be addressed by her primary medical doctor.  Palpitations: She is on atenolol 25 mg twice a day.  She does not tolerate it very well.  Her heart rate is well-controlled.  Will place a 3-day event monitor.  She has had allergic reaction to the adhesive.  Will apply one of the low allergy monitors if possible. She will follow-up with Dr. Jens Somrenshaw or Gillian Shieldsaitlin Walker in 6 weeks.   Left axillary pain :;  not exertional.  Does not worsen or change with deep breath this, eating or drinking.  It is definitely related to moving her arm.  I suggest that this may be musculoskeletal.  She informed that she has been to the chiropractor and had some manipulation and perhaps this is the issue.           Medication Adjustments/Labs and Tests Ordered: Current medicines are reviewed at length with the patient today.  Concerns regarding medicines are outlined above.  Orders Placed This Encounter  Procedures   LONG TERM MONITOR (3-14 DAYS)   EKG 12-Lead   No orders of the defined types were placed in this encounter.   Patient Instructions  Medication Instructions:  Your physician recommends that you continue on your current medications as directed. Please refer to the Current Medication list given to you today.  *If you need a  refill on your cardiac medications before your next appointment, please call your pharmacy*  Testing/Procedures: Your physician has recommended that you wear an event monitor. Event monitors are medical devices that record the heart's electrical activity. Doctors most often us these monitors to diagnose arrhythmias. Arrhythmias are problems with the speed or rhythm of the heartbeat. The monitor is a small, portable device. You can wear one while you do your normal daily activities. This is usually used to diagnose what is causing palpitations/syncope (passing out).    Follow-Up: At [facility], you and your health needs are our priority.  As part of our continuing mission to provide you with exceptional heart care, we have created designated Provider Care Teams.  These Care Teams include your primary Cardiologist (physician) and Advanced Practice Providers (APPs -  Physician Assistants and Nurse Practitioners) who all work together to provide you with the care you need, when you need it.  Your next appointment:   6 week(s)  Provider:   Olga MillersBrian Crenshaw, MD  or  Lucie Leatherkatlin Walker NP   Other Instructions Christena DeemZIO XT- Long Term Monitor Instructions  Your physician has requested you wear a ZIO patch monitor for 14 days.  This is a single patch monitor. [employer] supplies one patch monitor per enrollment. Additional stickers are not available. Please do not apply patch if you will be having a Nuclear Stress Test,  Echocardiogram, Cardiac CT, MRI, or Chest Xray during the period you would be wearing the  monitor. The patch cannot be worn during these tests. You cannot remove and re-apply the  ZIO XT patch monitor.  Your ZIO patch monitor will be mailed 3 day USPS to your address on file. It may take 3-5 days  to receive your monitor after you have been enrolled.  Once you have received your monitor, please review the enclosed instructions. Your monitor  has already been registered assigning a  specific monitor serial # to you.  Billing and Patient Assistance Program Information  We have supplied [employer] with any of your insurance information on file for billing purposes. [employer] offers a sliding scale Patient Assistance Program for patients that do not have  insurance, or whose insurance does not completely cover the cost of the ZIO monitor.  You must apply for the Patient Assistance Program to qualify for this discounted rate.  To apply, please call [employer] at [phone], select option 4, select option 2, ask to apply for  Patient Assistance Program. Meredeth Ide will ask your household income, and how many people  are in your household. They will quote your out-of-pocket cost based on that information.  [employer] will also be able to set up a 76-month, interest-free payment plan if needed.  Applying the monitor   Shave hair from upper left chest.  Hold abrader disc by orange tab. Rub abrader in 40 strokes over the upper left chest as  indicated in your monitor instructions.  Clean area with 4 enclosed alcohol pads. Let dry.  Apply patch as indicated in monitor instructions. Patch will be placed under collarbone on left  side of chest with arrow pointing upward.  Rub patch adhesive wings for 2 minutes. Remove white label marked "1". Remove the white  label marked "2". Rub patch adhesive wings for 2 additional minutes.  While looking in a mirror, press and release button in center of patch. A small green light will  flash 3-4 times. This will be your only indicator that the monitor has been turned on.  Do not shower for the first 24 hours. You may shower after the first 24 hours.  Press the button if you feel a symptom. You will hear a small click. Record Date, Time and  Symptom in the Patient Logbook.  When you are ready to remove the patch, follow instructions on the last 2 pages of Patient  Logbook. Stick patch monitor onto the last page of Patient Logbook.  Place Patient  Logbook in the blue and white box. Use locking tab on box and tape box closed  securely. The blue and white box has prepaid postage on it. Please place it in the mailbox as  soon as possible. Your physician should have your test results approximately 7 days after the  monitor has been mailed back to [facility].  Call [facility] Customer Care at [phone] if you have questions regarding  your ZIO XT patch monitor. Call them immediately if you see an orange light blinking on your  monitor.  If your monitor falls off in less than 4 days, contact our Monitor department at [phone].  If your monitor becomes loose or falls off after 4 days call [employer] at [phone] for  suggestions on securing your monitor     Signed, Kristeen Miss, MD  11/17/2022 5:22 PM    [facility]

## 2022-11-17 ENCOUNTER — Ambulatory Visit: Payer: 59

## 2022-11-17 ENCOUNTER — Ambulatory Visit: Payer: 59 | Attending: Cardiovascular Disease | Admitting: Cardiovascular Disease

## 2022-11-17 ENCOUNTER — Encounter: Payer: Self-pay | Admitting: Cardiovascular Disease

## 2022-11-17 VITALS — BP 122/72 | HR 82 | Ht 69.0 in | Wt 245.2 lb

## 2022-11-17 DIAGNOSIS — I471 Supraventricular tachycardia, unspecified: Secondary | ICD-10-CM | POA: Diagnosis not present

## 2022-11-17 NOTE — Patient Instructions (Signed)
Medication Instructions:  Your physician recommends that you continue on your current medications as directed. Please refer to the Current Medication list given to you today.  *If you need a refill on your cardiac medications before your next appointment, please call your pharmacy*  Testing/Procedures: Your physician has recommended that you wear an event monitor. Event monitors are medical devices that record the heart's electrical activity. Doctors most often [place] these monitors to diagnose arrhythmias. Arrhythmias are problems with the speed or rhythm of the heartbeat. The monitor is a small, portable device. You can wear one while you do your normal daily activities. This is usually used to diagnose what is causing palpitations/syncope (passing out).    Follow-Up: At [facility], you and your health needs are our priority.  As part of our continuing mission to provide you with exceptional heart care, we have created designated Provider Care Teams.  These Care Teams include your primary Cardiologist (physician) and Advanced Practice Providers (APPs -  Physician Assistants and Nurse Practitioners) who all work together to provide you with the care you need, when you need it.  Your next appointment:   6 week(s)  Provider:   Olga Millers, MD  or  Lucie Leather NP   Other Instructions Sheri Johnson- Long Term Monitor Instructions  Your physician has requested you wear a ZIO patch monitor for 14 days.  This is a single patch monitor. [employer] supplies one patch monitor per enrollment. Additional stickers are not available. Please do not apply patch if you will be having a Nuclear Stress Test,  Echocardiogram, Cardiac CT, MRI, or Chest Xray during the period you would be wearing the  monitor. The patch cannot be worn during these tests. You cannot remove and re-apply the  ZIO XT patch monitor.  Your ZIO patch monitor will be mailed 3 day USPS to your address on file. It may take 3-5 days   to receive your monitor after you have been enrolled.  Once you have received your monitor, please review the enclosed instructions. Your monitor  has already been registered assigning a specific monitor serial # to you.  Billing and Patient Assistance Program Information  We have supplied [employer] with any of your insurance information on file for billing purposes. [employer] offers a sliding scale Patient Assistance Program for patients that do not have  insurance, or whose insurance does not completely cover the cost of the ZIO monitor.  You must apply for the Patient Assistance Program to qualify for this discounted rate.  To apply, please call [employer] at [phone], select option 4, select option 2, ask to apply for  Patient Assistance Program. Sheri Johnson will ask your household income, and how many people  are in your household. They will quote your out-of-pocket cost based on that information.  [employer] will also be able to set up a 27-month, interest-free payment plan if needed.  Applying the monitor   Shave hair from upper left chest.  Hold abrader disc by orange tab. Rub abrader in 40 strokes over the upper left chest as  indicated in your monitor instructions.  Clean area with 4 enclosed alcohol pads. Let dry.  Apply patch as indicated in monitor instructions. Patch will be placed under collarbone on left  side of chest with arrow pointing upward.  Rub patch adhesive wings for 2 minutes. Remove white label marked "1". Remove the white  label marked "2". Rub patch adhesive wings for 2 additional minutes.  While looking in a mirror, press  and release button in center of patch. A small green light will  flash 3-4 times. This will be your only indicator that the monitor has been turned on.  Do not shower for the first 24 hours. You may shower after the first 24 hours.  Press the button if you feel a symptom. You will hear a small click. Record Date, Time and  Symptom in the Patient  Logbook.  When you are ready to remove the patch, follow instructions on the last 2 pages of Patient  Logbook. Stick patch monitor onto the last page of Patient Logbook.  Place Patient Logbook in the blue and white box. Use locking tab on box and tape box closed  securely. The blue and white box has prepaid postage on it. Please place it in the mailbox as  soon as possible. Your physician should have your test results approximately 7 days after the  monitor has been mailed back to [facility].  Call [facility] Customer Care at [phone] if you have questions regarding  your ZIO XT patch monitor. Call them immediately if you see an orange light blinking on your  monitor.  If your monitor falls off in less than 4 days, contact our Monitor department at [phone].  If your monitor becomes loose or falls off after 4 days call [employer] at [phone] for  suggestions on securing your monitor

## 2022-11-17 NOTE — Progress Notes (Unsigned)
Applied a 3 day [employer] Long term monitor on patient in the office

## 2022-11-19 ENCOUNTER — Ambulatory Visit
Admission: EM | Admit: 2022-11-19 | Discharge: 2022-11-19 | Disposition: A | Discharge status: Home / Self Care | Payer: Managed Care, Other (non HMO) | Attending: Urgent Care | Admitting: Urgent Care

## 2022-11-19 DIAGNOSIS — R070 Pain in throat: Secondary | ICD-10-CM | POA: Diagnosis not present

## 2022-11-19 DIAGNOSIS — R079 Chest pain, unspecified: Secondary | ICD-10-CM

## 2022-11-19 DIAGNOSIS — J309 Allergic rhinitis, unspecified: Secondary | ICD-10-CM | POA: Diagnosis not present

## 2022-11-19 DIAGNOSIS — K529 Noninfective gastroenteritis and colitis, unspecified: Secondary | ICD-10-CM

## 2022-11-19 DIAGNOSIS — R197 Diarrhea, unspecified: Secondary | ICD-10-CM

## 2022-11-19 LAB — POCT MONO SCREEN (KUC): Mono, POC: NEGATIVE

## 2022-11-19 LAB — POCT URINALYSIS DIP (MANUAL ENTRY)
Bilirubin, UA: NEGATIVE
Blood, UA: NEGATIVE
Glucose, UA: NEGATIVE mg/dL
Ketones, POC UA: NEGATIVE mg/dL
Leukocytes, UA: NEGATIVE
Nitrite, UA: NEGATIVE
Protein Ur, POC: NEGATIVE mg/dL
Spec Grav, UA: >=1.03 — AB (ref 1.010–1.025)
Urobilinogen, UA: 0.2 E.U./dL
pH, UA: 5.5 (ref 5.0–8.0)

## 2022-11-19 LAB — POCT RAPID STREP A (OFFICE): Rapid Strep A Screen: NEGATIVE

## 2022-11-19 LAB — POCT URINE PREGNANCY: Preg Test, Ur: NEGATIVE

## 2022-11-19 MED ORDER — LOPERAMIDE HCL 2 MG PO CAPS: 2.0000 mg | ORAL_CAPSULE | Freq: Two times a day (BID) | ORAL | 0 refills | Status: DC | PRN

## 2022-11-19 MED ORDER — PROMETHAZINE HCL 25 MG PO TABS: 25.0000 mg | ORAL_TABLET | Freq: Four times a day (QID) | ORAL | 0 refills | Status: DC | PRN

## 2022-11-19 NOTE — ED Triage Notes (Signed)
Pt reports palpitation, stabbing pain in left sided of throat, left sided chest pain, abdominal pain, diarrhea x 2 days. Pt saw his Cardiologist on 11/15/22.   Pt reports her potasium was low and she was taking potasium.

## 2022-11-19 NOTE — Discharge Instructions (Addendum)
Follow up with your PCP for recheck on your blood work. Continue taking Flonase and Zyrtec. Do not use any nonsteroidal anti-inflammatories (NSAIDs) like ibuprofen, Motrin, naproxen, Aleve, etc. which are all available over-the-counter.  Please just use Tylenol at a dose of 500mg -650mg  once every 6 hours as needed for your aches, abdominal pains, fevers. Make sure you push fluids drinking mostly water but mix it with Gatorade.  Try to eat light meals including soups, broths and soft foods, fruits.  You may use promethazine for your nausea and vomiting once every 8 hours.  Imodium can help with diarrhea but use this carefully limiting it to 1-2 times per day only if you are having a lot of diarrhea.  Please return to the clinic if symptoms worsen or you start having severe abdominal pain not helped by taking Tylenol or start having bloody stools or blood in the vomit.

## 2022-11-19 NOTE — ED Provider Notes (Signed)
[facility]  Note:  This document was prepared using Conservation officer, historic buildings and may include unintentional dictation errors.  MRN: [id number] DOB: [birth year]  Subjective:   Sheri Johnson is a 37 y.o. female presenting for multiple concerns.  Has extensive cardiac history and is followed by cardiology, was last seen 11/15/2022.  Has been taking potassium supplementation.  Has a history of palpitations, chronic intermittent chest pain, SVT.  She is requesting blood work to recheck these concerns.  She has also had 2-day history of left-sided neck pain, left-sided flushing of her body, left-sided flank pain, diarrhea, dark urine.  Reports a history of a diagnosed kidney stone.  Feels like she never passed it.  No fever, recent antibiotic use, hospitalizations or long distance travel.  Has not eaten raw foods, drank unfiltered water.  No history of GI disorders including Crohn's, IBS, ulcerative colitis.   No alcohol use.  No drug use. No fever, recent antibiotic use, hospitalizations or long distance travel.  Has not eaten raw foods, drank unfiltered water.  No history of GI disorders including Crohn's, IBS, ulcerative colitis.    No current facility-administered medications for this encounter.  Current Outpatient Medications:    albuterol (VENTOLIN HFA) 108 (90 Base) MCG/ACT inhaler, Inhale 2 puffs into the lungs every 6 (six) hours as needed for wheezing or shortness of breath., Disp: , Rfl:    ALPRAZolam (XANAX XR) 0.5 MG 24 hr tablet, Take by mouth., Disp: , Rfl:    Ascorbic Acid (VITAMIN C) 1000 MG tablet, Take 1,000 mg by mouth daily., Disp: , Rfl:    atenolol (TENORMIN) 25 MG tablet, Take 1 tablet (25 mg total) by mouth 2 (two) times daily., Disp: 180 tablet, Rfl: 3   b complex vitamins capsule, Take 1 capsule by mouth daily., Disp: , Rfl:    cetirizine (ZYRTEC) 10 MG tablet, Take 10 mg by mouth 2 (two) times daily., Disp: , Rfl:    clobetasol (TEMOVATE)  0.05 % external solution, Apply 1 Application topically at bedtime. Apply to scalp, Disp: , Rfl:    Digestive Enzymes CAPS, Take 1 capsule by mouth 2 (two) times daily before a meal., Disp: , Rfl:    ferrous sulfate 325 (65 FE) MG EC tablet, Take 1 tablet (325 mg total) by mouth daily with breakfast., Disp: 30 tablet, Rfl: 6   fluticasone (FLONASE) 50 MCG/ACT nasal spray, Place 1 spray into both nostrils daily as needed for allergies or rhinitis., Disp: , Rfl:    fluticasone (FLOVENT HFA) 44 MCG/ACT inhaler, Inhale 1 puff into the lungs 2 (two) times daily., Disp: , Rfl:    ibuprofen (ADVIL) 800 MG tablet, Take 800 mg by mouth every 8 (eight) hours as needed for moderate pain., Disp: , Rfl:    levothyroxine (SYNTHROID) 100 MCG tablet, Take 100 mcg by mouth every morning., Disp: , Rfl:    Magnesium 400 MG CAPS, Take 400 mg by mouth daily as needed (Low Magnesium)., Disp: , Rfl:    OVER THE COUNTER MEDICATION, Take 2 tablets by mouth daily. Medication: Beet Root Supplement, Disp: , Rfl:    OVER THE COUNTER MEDICATION, Take 2 capsules by mouth at bedtime. Medication: Clear Lungs, Disp: , Rfl:    potassium chloride SA (KLOR-CON M) 20 MEQ tablet, Take 1 tablet (20 mEq total) by mouth daily., Disp: 30 tablet, Rfl: 6   Probiotic Product (PROBIOTIC DAILY PO), Take 2 each by mouth at bedtime., Disp: , Rfl:    TURMERIC  CURCUMIN PO, Take 2 capsules by mouth daily., Disp: , Rfl:    Allergies  Allergen Reactions   Metoprolol Other (See Comments)    dizziness   Cephalexin Diarrhea   Cipro [Ciprofloxacin Hcl] Palpitations   Prednisone Palpitations   Sulfamethoxazole-Trimethoprim Diarrhea    Past Medical History:  Diagnosis Date   Abortion 11/14/2021   Anxiety    Asthma    Frequent headaches    Hypertension    Hypothyroid    SVT (supraventricular tachycardia)      Past Surgical History:  Procedure Laterality Date   CESAREAN SECTION     x2   LAPAROSCOPIC APPENDECTOMY N/A 12/14/2016    Procedure: APPENDECTOMY LAPAROSCOPIC;  Surgeon: Griselda Miner, MD;  Location: [facility] OR;  Service: General;  Laterality: N/A;   SVT ABLATION N/A 03/31/2022   Procedure: SVT ABLATION;  Surgeon: Marinus Maw, MD;  Location: [facility];  Service: Cardiovascular;  Laterality: N/A;    Family History  Problem Relation Age of Onset   Hypothyroidism Mother     Social History   Tobacco Use   Smoking status: Never   Smokeless tobacco: Never  Vaping Use   Vaping Use: Never used  Substance Use Topics   Alcohol use: Not Currently    Comment: Rare   Drug use: Never    ROS   Objective:   Vitals: BP 139/86 (BP Location: Left Arm)   Pulse 88   Temp 99.3 F (37.4 C) (Oral)   Resp 18   LMP 10/28/2022 (Approximate)   SpO2 97%   Physical Exam Constitutional:      General: She is not in acute distress.    Appearance: Normal appearance. She is well-developed. She is not ill-appearing, toxic-appearing or diaphoretic.  HENT:     Head: Normocephalic and atraumatic.     Nose: Nose normal.     Mouth/Throat:     Mouth: Mucous membranes are moist.     Pharynx: No oropharyngeal exudate or posterior oropharyngeal erythema.  Eyes:     General: No scleral icterus.       Right eye: No discharge.        Left eye: No discharge.     Extraocular Movements: Extraocular movements intact.     Conjunctiva/sclera: Conjunctivae normal.  Cardiovascular:     Rate and Rhythm: Normal rate and regular rhythm.     Heart sounds: Normal heart sounds. No murmur heard.    No friction rub. No gallop.  Pulmonary:     Effort: Pulmonary effort is normal. No respiratory distress.     Breath sounds: No stridor. No wheezing, rhonchi or rales.  Chest:     Chest wall: Tenderness present.    Abdominal:     General: Bowel sounds are normal. There is no distension.     Palpations: Abdomen is soft. There is no mass.     Tenderness: There is abdominal tenderness (as well as left flank side) in the right lower  quadrant, periumbilical area, left upper quadrant and left lower quadrant. There is guarding. There is no right CVA tenderness, left CVA tenderness or rebound.  Skin:    General: Skin is warm and dry.  Neurological:     General: No focal deficit present.     Mental Status: She is alert and oriented to person, place, and time.  Psychiatric:        Mood and Affect: Mood normal.        Behavior: Behavior normal.  Thought Content: Thought content normal.        Judgment: Judgment normal.     Results for orders placed or performed during the hospital encounter of 11/19/22 (from the past 24 hour(s))  POCT urine pregnancy     Status: None   Collection Time: 11/19/22 10:41 AM  Result Value Ref Range   Preg Test, Ur Negative Negative  POCT urinalysis dipstick     Status: Abnormal   Collection Time: 11/19/22 10:41 AM  Result Value Ref Range   Color, UA yellow yellow   Clarity, UA clear clear   Glucose, UA negative negative mg/dL   Bilirubin, UA negative negative   Ketones, POC UA negative negative mg/dL   Spec Grav, UA >=7.628 (A) 1.010 - 1.025   Blood, UA negative negative   pH, UA 5.5 5.0 - 8.0   Protein Ur, POC negative negative mg/dL   Urobilinogen, UA 0.2 0.2 or 1.0 E.U./dL   Nitrite, UA Negative Negative   Leukocytes, UA Negative Negative  POCT rapid strep A     Status: None   Collection Time: 11/19/22 10:42 AM  Result Value Ref Range   Rapid Strep A Screen Negative Negative  POCT mono screen     Status: None   Collection Time: 11/19/22 10:45 AM  Result Value Ref Range   Mono, POC Negative Negative   ED ECG REPORT   Date: 11/19/2022  EKG Time: 11:08 AM  Rate: 82bpm  Rhythm: normal sinus rhythm,  unchanged from previous tracings  Axis: normal  Intervals:none  ST&T Change: T wave flattening in lead III, V2  Narrative Interpretation: Sinus rhythm at 82 bpm with nonspecific T wave changes as above, very comparable to previous EKG.  No signs of hyperkalemia as there  are no spike T waves.  Assessment and Plan :   PDMP not reviewed this encounter.  1. Colitis   2. Diarrhea, unspecified type   3. Allergic rhinitis, unspecified seasonality, unspecified trigger   4. Left-sided chest pain   5. Throat pain    Patient is to follow-up with her PCP and cardiologist for repeat labs and a recheck as needed.  Recommended general management for colitis with promethazine and loperamide.  Push fluids, eat bland foods.  I had an extensive discussion with patient about the utility of a CT scan of the abdomen pelvis but patient does not want to present to the emergency room now.  As such recommended urgent follow-up with her regular doctor.  Maintain strict ER precautions.   Wallis Bamberg, PA-C 11/19/22 1109

## 2022-11-21 ENCOUNTER — Other Ambulatory Visit: Payer: Self-pay

## 2022-11-21 ENCOUNTER — Emergency Department (HOSPITAL_COMMUNITY)
Admission: EM | Admit: 2022-11-21 | Discharge: 2022-11-21 | Discharge status: Against Medical Advice | Payer: Managed Care, Other (non HMO) | Attending: Emergency Medicine | Admitting: Emergency Medicine

## 2022-11-21 DIAGNOSIS — R000 Tachycardia, unspecified: Secondary | ICD-10-CM | POA: Diagnosis not present

## 2022-11-21 DIAGNOSIS — R109 Unspecified abdominal pain: Secondary | ICD-10-CM | POA: Insufficient documentation

## 2022-11-21 DIAGNOSIS — R197 Diarrhea, unspecified: Secondary | ICD-10-CM | POA: Insufficient documentation

## 2022-11-21 DIAGNOSIS — Z5321 Procedure and treatment not carried out due to patient leaving prior to being seen by health care provider: Secondary | ICD-10-CM | POA: Insufficient documentation

## 2022-11-21 DIAGNOSIS — R110 Nausea: Secondary | ICD-10-CM | POA: Diagnosis not present

## 2022-11-21 LAB — COMPREHENSIVE METABOLIC PANEL
ALT: 27 U/L (ref 0–44)
AST: 21 U/L (ref 15–41)
Albumin: 4.4 g/dL (ref 3.5–5.0)
Alkaline Phosphatase: 37 U/L — ABNORMAL LOW (ref 38–126)
Anion gap: 13 (ref 5–15)
BUN: 8 mg/dL (ref 6–20)
CO2: 23 mmol/L (ref 22–32)
Calcium: 9.6 mg/dL (ref 8.9–10.3)
Chloride: 101 mmol/L (ref 98–111)
Creatinine, Ser: 0.94 mg/dL (ref 0.44–1.00)
GFR, Estimated: >60 mL/min (ref >60)
Glucose, Bld: 131 mg/dL — ABNORMAL HIGH (ref 70–99)
Potassium: 3.9 mmol/L (ref 3.5–5.1)
Sodium: 137 mmol/L (ref 135–145)
Total Bilirubin: 0.4 mg/dL (ref 0.3–1.2)
Total Protein: 7.3 g/dL (ref 6.5–8.1)

## 2022-11-21 LAB — CBC
HCT: 44.1 % (ref 36.0–46.0)
Hemoglobin: 14.6 g/dL (ref 12.0–15.0)
MCH: 28.8 pg (ref 26.0–34.0)
MCHC: 33.1 g/dL (ref 30.0–36.0)
MCV: 87 fL (ref 80.0–100.0)
Platelets: 285 10*3/uL (ref 150–400)
RBC: 5.07 MIL/uL (ref 3.87–5.11)
RDW: 13.4 % (ref 11.5–15.5)
WBC: 10 10*3/uL (ref 4.0–10.5)
nRBC: 0 % (ref 0.0–0.2)

## 2022-11-21 LAB — URINALYSIS, ROUTINE W REFLEX MICROSCOPIC
Bilirubin Urine: NEGATIVE
Glucose, UA: NEGATIVE mg/dL
Hgb urine dipstick: NEGATIVE
Ketones, ur: NEGATIVE mg/dL
Leukocytes,Ua: NEGATIVE
Nitrite: NEGATIVE
Protein, ur: NEGATIVE mg/dL
Specific Gravity, Urine: 1.006 (ref 1.005–1.030)
pH: 6 (ref 5.0–8.0)

## 2022-11-21 LAB — LIPASE, BLOOD: Lipase: 29 U/L (ref 11–51)

## 2022-11-21 LAB — TROPONIN I (HIGH SENSITIVITY): Troponin I (High Sensitivity): 3 ng/L (ref <18)

## 2022-11-21 LAB — I-STAT BETA HCG BLOOD, ED (MC, WL, AP ONLY): I-stat hCG, quantitative: <5 m[IU]/mL (ref <5)

## 2022-11-21 NOTE — ED Provider Triage Note (Signed)
Emergency Medicine Provider Triage Evaluation Note  Sheri Johnson , a 36 y.o. female  was evaluated in triage.  Pt here with many complaints.  States that this morning she had an approximately 15-minute long episode of SVT.  States that she has had this before and takes atenolol at home.  She had a cardiac ablation for this in the fall 2023.  States that she contacted her cardiologist last week as "I knew something was wrong" and cardiologist ordered an outpatient heart monitor but she was allergic to the adhesive and was unable to tolerate this.  She states for the last week she has had intermittent left-sided abdominal pain that feels like a "electrocution" pain.  States that with this she has had associated nausea and episodes of diarrhea daily.  No hematemesis, hematochezia, or melena.  No fever, chills, chest pain, or shortness of breath.  She states that she contacted her PCP to discuss this because she "knows something is wrong with my stomach" and patient wanted blood work but PCP declines this after referring to recent cardiology visit.  Review of Systems  Positive: See HPI Negative: See HPI  Physical Exam  BP (!) 150/83 (BP Location: Right Arm)   Pulse (!) 101   Temp 98.3 F (36.8 C)   Resp 18   LMP 10/28/2022 (Approximate)   SpO2 100%  Gen:   Awake, no distress   Resp:  Normal effort and clear to auscultation MSK:   Moves extremities without difficulty no lower extremity edema Other:  Mild tachycardia with regular rhythm, abdomen soft, nontender, and without rebound, guarding, or peritoneal signs, no CVA tenderness, patient very anxious and tearful  Medical Decision Making  Medically screening exam initiated at 3:08 PM.  Appropriate orders placed.  Sheri Johnson was informed that the remainder of the evaluation will be completed by another provider, this initial triage assessment does not replace that evaluation, and the importance of remaining in the ED until their evaluation is  complete.     Sheri Lederer, PA-C 11/21/22 1511

## 2022-11-21 NOTE — ED Notes (Signed)
Pt notified staff that she is leaving because she has to go pick up her son. 

## 2022-11-21 NOTE — ED Triage Notes (Signed)
Pt with severe left sided abdominal pain since Wednesday. Endorses diarrhea and nausea. Having pain all the way from her ear down to her leg. Felt herself go into SVT today which has now resolved.

## 2022-11-22 ENCOUNTER — Encounter: Payer: Self-pay | Admitting: Cardiology

## 2022-11-23 ENCOUNTER — Ambulatory Visit (HOSPITAL_COMMUNITY): Payer: 59

## 2022-11-28 ENCOUNTER — Ambulatory Visit: Payer: 59 | Admitting: Physician Assistant

## 2022-11-30 ENCOUNTER — Telehealth: Payer: Self-pay | Admitting: *Deleted

## 2022-11-30 NOTE — Telephone Encounter (Signed)
Contacted regarding [facility] referral. Left voice message to return call for more information. 

## 2022-12-01 ENCOUNTER — Telehealth: Payer: Self-pay | Admitting: *Deleted

## 2022-12-01 NOTE — Telephone Encounter (Signed)
3 day holter monitor applied 11/17/22 came back with only 19.5 hours of data.  Anything less than 24 hours and we can cancel the charges. I would like to speak with you to see if there would be anything we could do to increase the wear time of a monitor and possible reschedule for a second attempt.  Please call Sheri Johnson in monitors at [facility] [phone].

## 2022-12-12 NOTE — Telephone Encounter (Signed)
   Name: Sheri Johnson  DOB: [birth year]  MRN: [id number]  Primary Cardiologist: Olga Millers, MD  Chart reviewed as part of pre-operative protocol coverage. Because of Sheri Johnson past medical history and time since last visit, she will require a follow-up in-office visit in order to better assess preoperative cardiovascular risk.  Patient is scheduled with Gillian Shields, NP on 01/08/2023. I have updated appointment notes to reflect pre-op evaluation.   Pre-op covering staff:  - Please contact requesting surgeon's office via preferred method (i.e, phone, fax) to inform them of need for appointment prior to surgery.    Sheri Levering, NP  12/12/2022, 10:31 AM

## 2022-12-12 NOTE — Telephone Encounter (Signed)
Received another surgical clearance request for pt.  New surgery date 01/12/23.

## 2022-12-12 NOTE — Telephone Encounter (Signed)
Faxed back to [facility] to make them aware pt has an appointment 01/08/23, clearance will be addressed at that time.

## 2022-12-21 ENCOUNTER — Ambulatory Visit (HOSPITAL_COMMUNITY)
Admission: RE | Admit: 2022-12-21 | Discharge: 2022-12-21 | Disposition: A | Discharge status: Home / Self Care | Payer: Managed Care, Other (non HMO) | Source: Ambulatory Visit | Attending: Internal Medicine | Admitting: Internal Medicine

## 2022-12-21 DIAGNOSIS — R600 Localized edema: Secondary | ICD-10-CM

## 2022-12-21 DIAGNOSIS — M2540 Effusion, unspecified joint: Secondary | ICD-10-CM | POA: Diagnosis present

## 2022-12-21 DIAGNOSIS — R0609 Other forms of dyspnea: Secondary | ICD-10-CM

## 2022-12-21 DIAGNOSIS — I739 Peripheral vascular disease, unspecified: Secondary | ICD-10-CM | POA: Diagnosis not present

## 2022-12-21 DIAGNOSIS — I471 Supraventricular tachycardia, unspecified: Secondary | ICD-10-CM | POA: Insufficient documentation

## 2022-12-21 DIAGNOSIS — R002 Palpitations: Secondary | ICD-10-CM | POA: Insufficient documentation

## 2022-12-22 LAB — VAS US ABI WITH/WO TBI
Left ABI: 1.15
Right ABI: 1.16

## 2023-01-03 ENCOUNTER — Emergency Department (HOSPITAL_BASED_OUTPATIENT_CLINIC_OR_DEPARTMENT_OTHER)
Admission: EM | Admit: 2023-01-03 | Discharge: 2023-01-03 | Disposition: A | Discharge status: Home / Self Care | Payer: Managed Care, Other (non HMO) | Attending: Emergency Medicine | Admitting: Emergency Medicine

## 2023-01-03 ENCOUNTER — Encounter: Payer: Self-pay | Admitting: Psychiatry

## 2023-01-03 ENCOUNTER — Emergency Department (HOSPITAL_BASED_OUTPATIENT_CLINIC_OR_DEPARTMENT_OTHER): Payer: Managed Care, Other (non HMO)

## 2023-01-03 ENCOUNTER — Other Ambulatory Visit: Payer: Self-pay

## 2023-01-03 ENCOUNTER — Encounter (HOSPITAL_BASED_OUTPATIENT_CLINIC_OR_DEPARTMENT_OTHER): Payer: Self-pay

## 2023-01-03 DIAGNOSIS — Z79899 Other long term (current) drug therapy: Secondary | ICD-10-CM | POA: Diagnosis not present

## 2023-01-03 DIAGNOSIS — R0789 Other chest pain: Secondary | ICD-10-CM | POA: Diagnosis not present

## 2023-01-03 DIAGNOSIS — Z20822 Contact with and (suspected) exposure to covid-19: Secondary | ICD-10-CM | POA: Diagnosis not present

## 2023-01-03 DIAGNOSIS — R0602 Shortness of breath: Secondary | ICD-10-CM | POA: Diagnosis not present

## 2023-01-03 DIAGNOSIS — M79605 Pain in left leg: Secondary | ICD-10-CM

## 2023-01-03 DIAGNOSIS — M79662 Pain in left lower leg: Secondary | ICD-10-CM | POA: Diagnosis not present

## 2023-01-03 DIAGNOSIS — I10 Essential (primary) hypertension: Secondary | ICD-10-CM | POA: Insufficient documentation

## 2023-01-03 DIAGNOSIS — R079 Chest pain, unspecified: Secondary | ICD-10-CM | POA: Diagnosis present

## 2023-01-03 LAB — RESP PANEL BY RT-PCR (RSV, FLU A&B, COVID)  RVPGX2
Influenza A by PCR: NEGATIVE
Influenza B by PCR: NEGATIVE
Resp Syncytial Virus by PCR: NEGATIVE
SARS Coronavirus 2 by RT PCR: NEGATIVE

## 2023-01-03 LAB — HEPATIC FUNCTION PANEL
ALT: 50 U/L — ABNORMAL HIGH (ref 0–44)
AST: 40 U/L (ref 15–41)
Albumin: 4.1 g/dL (ref 3.5–5.0)
Alkaline Phosphatase: 38 U/L (ref 38–126)
Bilirubin, Direct: <0.1 mg/dL (ref 0.0–0.2)
Total Bilirubin: 0.4 mg/dL (ref 0.3–1.2)
Total Protein: 7.1 g/dL (ref 6.5–8.1)

## 2023-01-03 LAB — BASIC METABOLIC PANEL
Anion gap: 11 (ref 5–15)
BUN: 11 mg/dL (ref 6–20)
CO2: 22 mmol/L (ref 22–32)
Calcium: 8.8 mg/dL — ABNORMAL LOW (ref 8.9–10.3)
Chloride: 104 mmol/L (ref 98–111)
Creatinine, Ser: 0.89 mg/dL (ref 0.44–1.00)
GFR, Estimated: >60 mL/min (ref >60)
Glucose, Bld: 108 mg/dL — ABNORMAL HIGH (ref 70–99)
Potassium: 4.3 mmol/L (ref 3.5–5.1)
Sodium: 137 mmol/L (ref 135–145)

## 2023-01-03 LAB — CBC
HCT: 39.9 % (ref 36.0–46.0)
Hemoglobin: 13.7 g/dL (ref 12.0–15.0)
MCH: 28.9 pg (ref 26.0–34.0)
MCHC: 34.3 g/dL (ref 30.0–36.0)
MCV: 84.2 fL (ref 80.0–100.0)
Platelets: 291 10*3/uL (ref 150–400)
RBC: 4.74 MIL/uL (ref 3.87–5.11)
RDW: 13.1 % (ref 11.5–15.5)
WBC: 8.3 10*3/uL (ref 4.0–10.5)
nRBC: 0 % (ref 0.0–0.2)

## 2023-01-03 LAB — LIPASE, BLOOD: Lipase: 31 U/L (ref 11–51)

## 2023-01-03 LAB — TROPONIN I (HIGH SENSITIVITY)
Troponin I (High Sensitivity): <2 ng/L (ref <18)
Troponin I (High Sensitivity): <2 ng/L (ref <18)

## 2023-01-03 LAB — PREGNANCY, URINE: Preg Test, Ur: NEGATIVE

## 2023-01-03 MED ORDER — SODIUM CHLORIDE 0.9 % IV BOLUS
500.0000 mL | Freq: Once | INTRAVENOUS | Status: AC
  Administered 2023-01-03: 500 mL via INTRAVENOUS

## 2023-01-03 MED ORDER — IOHEXOL 350 MG/ML SOLN
100.0000 mL | Freq: Once | INTRAVENOUS | Status: AC | PRN
  Administered 2023-01-03: 75 mL via INTRAVENOUS

## 2023-01-03 NOTE — Telephone Encounter (Signed)
She needs to be work in soon with Dr. Delena Bali or a different provider including the NPs. November is too far out. If her symptoms get worse, she needs to go to [facility]

## 2023-01-03 NOTE — ED Provider Notes (Signed)
[facility] Provider Note   CSN: [id number] Arrival date & time: 01/03/23  1913     History  Chief Complaint  Patient presents with   Chest Pain    Sheri Johnson is a 37 y.o. female.  The history is provided by the patient and medical records. No language interpreter was used.  Chest Pain Pain location:  L chest Pain quality: aching and radiating   Pain radiates to:  L shoulder, L arm and neck Pain severity:  Moderate Onset quality:  Gradual Duration:  2 days Timing:  Constant Progression:  Waxing and waning Chronicity:  Recurrent Context: breathing   Relieved by:  Nothing Worsened by:  Nothing Ineffective treatments:  None tried Associated symptoms: nausea   Associated symptoms: no abdominal pain (chronic abd pain reported), no altered mental status, no back pain, no claudication, no cough, no diaphoresis, no dizziness, no fatigue, no fever, no headache, no numbness, no palpitations, no shortness of breath, no vomiting and no weakness   Risk factors: no prior DVT/PE        Home Medications Prior to Admission medications   Medication Sig Start Date End Date Taking? Authorizing Provider  albuterol (VENTOLIN HFA) 108 (90 Base) MCG/ACT inhaler Inhale 2 puffs into the lungs every 6 (six) hours as needed for wheezing or shortness of breath.    [provider]  ALPRAZolam (XANAX XR) 0.5 MG 24 hr tablet Take by mouth. 07/25/22   [provider]  Ascorbic Acid (VITAMIN C) 1000 MG tablet Take 1,000 mg by mouth daily.    [provider]  atenolol (TENORMIN) 25 MG tablet Take 1 tablet (25 mg total) by mouth 2 (two) times daily. 07/27/22   Azalee Course, PA  b complex vitamins capsule Take 1 capsule by mouth daily.    [provider]  cetirizine (ZYRTEC) 10 MG tablet Take 10 mg by mouth 2 (two) times daily.    [provider]  clobetasol (TEMOVATE) 0.05 % external solution Apply 1 Application  topically at bedtime. Apply to scalp 02/27/22   [provider]  Digestive Enzymes CAPS Take 1 capsule by mouth 2 (two) times daily before a meal.    [provider]  ferrous sulfate 325 (65 FE) MG EC tablet Take 1 tablet (325 mg total) by mouth daily with breakfast. 10/11/22   Ocie Doyne, MD  fluticasone (FLONASE) 50 MCG/ACT nasal spray Place 1 spray into both nostrils daily as needed for allergies or rhinitis.    [provider]  fluticasone (FLOVENT HFA) 44 MCG/ACT inhaler Inhale 1 puff into the lungs 2 (two) times daily.    [provider]  ibuprofen (ADVIL) 800 MG tablet Take 800 mg by mouth every 8 (eight) hours as needed for moderate pain.    [provider]  levothyroxine (SYNTHROID) 100 MCG tablet Take 100 mcg by mouth every morning.    [provider]  loperamide (IMODIUM) 2 MG capsule Take 1 capsule (2 mg total) by mouth 2 (two) times daily as needed for diarrhea or loose stools. 11/19/22   Wallis Bamberg, PA-C  Magnesium 400 MG CAPS Take 400 mg by mouth daily as needed (Low Magnesium).    [provider]  OVER THE COUNTER MEDICATION Take 2 tablets by mouth daily. Medication: Beet Root Supplement    [provider]  OVER THE COUNTER MEDICATION Take 2 capsules by mouth at bedtime. Medication: Clear Lungs    [provider]  potassium chloride SA (KLOR-CON M) 20 MEQ tablet Take 1 tablet (20 mEq total) by mouth daily. 12/14/21   Tereso Newcomer T, PA-C  Probiotic Product (PROBIOTIC DAILY PO) Take 2 each by mouth at bedtime.    [provider]  promethazine (PHENERGAN) 25 MG tablet Take 1 tablet (25 mg total) by mouth every 6 (six) hours as needed for nausea or vomiting. 11/19/22   Wallis Bamberg, PA-C  TURMERIC CURCUMIN PO Take 2 capsules by mouth daily.    [provider]      Allergies    Metoprolol, Cephalexin, Cipro [ciprofloxacin hcl], Prednisone, and Sulfamethoxazole-trimethoprim    Review of  Systems   Review of Systems  Constitutional:  Negative for chills, diaphoresis, fatigue and fever.  HENT:  Negative for congestion.   Respiratory:  Positive for chest tightness. Negative for cough, shortness of breath and wheezing.   Cardiovascular:  Positive for chest pain. Negative for palpitations, claudication and leg swelling.  Gastrointestinal:  Positive for nausea. Negative for abdominal pain (chronic abd pain reported), constipation, diarrhea and vomiting.  Genitourinary:  Negative for dysuria and flank pain.  Musculoskeletal:  Negative for back pain, neck pain and neck stiffness.  Skin:  Negative for rash and wound.  Neurological:  Negative for dizziness, weakness, light-headedness, numbness and headaches.  Psychiatric/Behavioral:  Negative for agitation and confusion.   All other systems reviewed and are negative.   Physical Exam Updated Vital Signs BP 133/74 (BP Location: Right Arm)   Pulse 100   Temp 99 F (37.2 C)   Resp 20   Ht 5\' 9"  (1.753 m)   Wt 108.9 kg   LMP 12/29/2022 (Exact Date)   SpO2 97%   BMI 35.44 kg/m  Physical Exam Vitals and nursing note reviewed.  Constitutional:      General: She is not in acute distress.    Appearance: She is well-developed. She is not ill-appearing, toxic-appearing or diaphoretic.  HENT:     Head: Normocephalic and atraumatic.  Eyes:     Extraocular Movements: Extraocular movements intact.     Conjunctiva/sclera: Conjunctivae normal.     Pupils: Pupils are equal, round, and reactive to light.  Cardiovascular:     Rate and Rhythm: Regular rhythm. Tachycardia present.     Heart sounds: Normal heart sounds. No murmur heard. Pulmonary:     Effort: Pulmonary effort is normal. No respiratory distress.     Breath sounds: Normal breath sounds. No wheezing or rhonchi.  Chest:     Chest wall: Tenderness present.  Abdominal:     Palpations: Abdomen is soft.     Tenderness: There is no abdominal tenderness.  Musculoskeletal:         General: No swelling.     Cervical back: Neck supple.     Right lower leg: No tenderness. No edema.     Left lower leg: No tenderness. No edema.  Skin:    General: Skin is warm and dry.     Capillary Refill: Capillary refill takes less than 2 seconds.     Findings: No erythema.  Neurological:     General: No focal deficit present.     Mental Status: She is alert.  Psychiatric:        Mood and Affect: Mood normal.     ED Results / Procedures / Treatments   Labs (all labs ordered are listed, but only abnormal results are displayed) Labs Reviewed  BASIC METABOLIC PANEL - Abnormal; Notable for the following components:  Result Value   Glucose, Bld 108 (*)    Calcium 8.8 (*)    All other components within normal limits  HEPATIC FUNCTION PANEL - Abnormal; Notable for the following components:   ALT 50 (*)    All other components within normal limits  RESP PANEL BY RT-PCR (RSV, FLU A&B, COVID)  RVPGX2  CBC  PREGNANCY, URINE  LIPASE, BLOOD  TROPONIN I (HIGH SENSITIVITY)  TROPONIN I (HIGH SENSITIVITY)    EKG EKG Interpretation  Date/Time:  Wednesday Jan 03 2023 19:26:00 EDT Ventricular Rate:  90 PR Interval:  150 QRS Duration: 95 QT Interval:  409 QTC Calculation: 501 R Axis:   56 Text Interpretation: Sinus rhythm Low voltage, precordial leads Borderline T abnormalities, anterior leads Borderline prolonged QT interval when compared to prior, overall similar appearance with slightly longer QTc. No STEMI Confirmed by Theda Belfast ([id number]) on 01/03/2023 7:56:35 PM  Radiology CT Angio Chest PE W and/or Wo Contrast  Result Date: 01/03/2023 CLINICAL DATA:  Pulmonary embolism suspected.  High probability. EXAM: CT ANGIOGRAPHY CHEST WITH CONTRAST TECHNIQUE: Multidetector CT imaging of the chest was performed using the standard protocol during bolus administration of intravenous contrast. Multiplanar CT image reconstructions and MIPs were obtained to evaluate the vascular  anatomy. RADIATION DOSE REDUCTION: This exam was performed according to the departmental dose-optimization program which includes automated exposure control, adjustment of the mA and/or kV according to patient size and/or use of iterative reconstruction technique. CONTRAST:  75mL OMNIPAQUE IOHEXOL 350 MG/ML SOLN COMPARISON:  PA and lateral chest today, PA and lateral chest 10/14/2022, partial chest CT with contrast for coronary CTA dated 05/04/2022, CTA chest 01/30/2022 FINDINGS: Cardiovascular: The heart is slightly enlarged but less enlarged than previously. No pericardial effusion is seen. There are no visible coronary artery calcifications. The aorta and great vessels are normal. The pulmonary arteries and veins are normal in caliber no arterial embolism is seen. Mediastinum/Nodes: No enlarged mediastinal, hilar, or axillary lymph nodes. Thyroid gland, trachea, and esophagus demonstrate no significant findings. Both main bronchi are clear. Lungs/Pleura: There are chronic linear scar-like opacities in the lingular base. There are mild features of posterior atelectasis. There are no pulmonary infiltrates or nodules and no pleural effusion, thickening or pneumothorax. Mild chronic elevation right hemidiaphragm. Musculoskeletal: There is thoracic spondylosis without acute or other significant osseous findings. The ribcage is intact. No aggressive process is seen. No focal abnormality in the chest wall. Upright abdomen: No acute abnormality.  Mild hepatic steatosis. Review of the MIP images confirms the above findings. IMPRESSION: 1. No acute chest CT or CTA findings. 2. Mild cardiomegaly. No visible atherosclerosis in the aorta and coronary arteries. 3. Mild hepatic steatosis. Electronically Signed   By: Almira Bar M.D.   On: 01/03/2023 22:09   US Venous Img Lower Unilateral Left  Result Date: 01/03/2023 CLINICAL DATA:  Recent travel with left lower extremity pain. EXAM: LEFT LOWER EXTREMITY VENOUS DOPPLER  ULTRASOUND TECHNIQUE: Gray-scale sonography with compression, as well as color and duplex ultrasound, were performed to evaluate the deep venous system(s) from the level of the common femoral vein through the popliteal and proximal calf veins. COMPARISON:  None Available. FINDINGS: VENOUS Normal compressibility of the common femoral, superficial femoral, and popliteal veins, as well as the visualized calf veins. Visualized portions of profunda femoral vein and great saphenous vein unremarkable. No filling defects to suggest DVT on grayscale or color Doppler imaging. Doppler waveforms show normal direction of venous flow, normal respiratory plasticity and response to augmentation.  Limited views of the contralateral common femoral vein are unremarkable. OTHER None. Limitations: none IMPRESSION: Negative. Electronically Signed   By: Aram Candela M.D.   On: 01/03/2023 21:50   DG Chest 2 View  Result Date: 01/03/2023 CLINICAL DATA:  Chest pain EXAM: CHEST - 2 VIEW COMPARISON:  10/14/2022 FINDINGS: The heart size and mediastinal contours are within normal limits. Both lungs are clear. The visualized skeletal structures are unremarkable. IMPRESSION: No active cardiopulmonary disease. Electronically Signed   By: Ernie Avena M.D.   On: 01/03/2023 20:03    Procedures Procedures    Medications Ordered in ED Medications  sodium chloride 0.9 % bolus 500 mL (0 mLs Intravenous Stopped 01/03/23 2153)  iohexol (OMNIPAQUE) 350 MG/ML injection 100 mL (75 mLs Intravenous Contrast Given 01/03/23 2140)    ED Course/ Medical Decision Making/ A&P                             Medical Decision Making Amount and/or Complexity of Data Reviewed Labs: ordered. Radiology: ordered.  Risk Prescription drug management.    Reesheda Roling is a 37 y.o. 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  Patient reports that she was on her drive back from [place] and [place] she had onset of central chest tightness and pain.  She reports since then for the last 2 days she has had the symptoms.  She reports that is going into her neck and left arm where she has had some chronic nerve and arm pains.  She reports no new trauma.  She does report her left leg has been hurting but denies history of DVT or PE.  Denies any trauma.  Denies any swelling.  She reports she has had some malaise and some nausea but has not had significant vomiting.  Denies any other G eye or GU symptoms.  Unsure if she has any viral infection.  On exam, lungs were clear.  Chest was tender in the left upper chest.  No murmur.  Abdomen nontender focally.  Bowel sounds were appreciated.  Back and flanks nontender.  Good pulses in extremities.  She was describing some left calf pain which is where she had the tenderness earlier but it has improved.  She had no focal neurologic deficits on my exam.  Patient appears anxious.  EKG did not show STEMI.  We had a shared decision-making conversation and agreed to get ultrasound and CT PE study as well as workup to rule out a cardiac cause of symptoms.  Workup returned reassuring with no evidence of PE on CT and no abnormality on the DVT ultrasound.  Labs overall reassuring including troponin x 2.  She did not have COVID flu RSV.  Given her nausea and recent travel and her malaise I do suspect patient may have a viral infection from her travels that has caused her to have some inflammation and flareup of her chronic left neck and left arm pain and symptoms.  We do feel reasonable to have her follow-up with her PCP and her neurology team.  She reports she does have a nerve conduction study coming up to further this to get the left arm  symptoms.  Given the reassuring workup  we do feel she is safe for discharge home and patient agrees.  She will will follow-up with her PCP and also follow-up with cardiology as the CT showed mild cardiomegaly and we discussed this.  Patient with questions or concerns and was discharged in good condition understanding return precautions.         Final Clinical Impression(s) / ED Diagnoses Final diagnoses:  Atypical chest pain  Shortness of breath  Pain of left lower extremity    Rx / DC Orders ED Discharge Orders     None      Clinical Impression: 1. Atypical chest pain   2. Shortness of breath   3. Pain of left lower extremity     Disposition: Discharge  Condition: Good  I have discussed the results, Dx and Tx plan with the pt(& family if present). He/she/they expressed understanding and agree(s) with the plan. Discharge instructions discussed at great length. Strict return precautions discussed and pt &/or family have verbalized understanding of the instructions. No further questions at time of discharge.    Discharge Medication List as of 01/03/2023 11:38 PM      Follow Up: Porfirio Oar, PA [address] [place] [address] [phone]     [facility] Emergency Department at [facility] [address] [id number] [address] [place] [address] [phone]        Deosha Werden, Canary Brim, MD 01/03/23 2350

## 2023-01-03 NOTE — Discharge Instructions (Addendum)
Your history, exam, workup today were overall reassuring.  We did a CT scan that did not show evidence of any blood clot, pneumonia or evidence of acute abnormality.  Your heart enzymes were negative both times we checked them.  Your COVID flu and RSV test was negative.  Your ultrasound tonight shows no blood clot in the leg.  With your recent travel and nausea, we discussed you may have a viral illness that has irritated parts your body that have had problems in the past including your chronic left neck and arm symptoms. Please continue your outpatient workup for this.  Please follow-up with your outpatient primary teams.  Given your reassuring workup and stability for over 4 hours here, we feel you are safe for discharge home.  If any symptoms change or worsen acutely, please return to the nearest emergency department.

## 2023-01-03 NOTE — ED Triage Notes (Signed)
Central chest pressure radiating down R arm x2 days. Pt also reports nausea and indigestion.

## 2023-01-08 ENCOUNTER — Ambulatory Visit (INDEPENDENT_AMBULATORY_CARE_PROVIDER_SITE_OTHER): Payer: 59 | Admitting: Family

## 2023-01-08 ENCOUNTER — Encounter (HOSPITAL_BASED_OUTPATIENT_CLINIC_OR_DEPARTMENT_OTHER): Payer: Self-pay | Admitting: Family

## 2023-01-08 VITALS — BP 122/88 | HR 72 | Ht 69.0 in | Wt 242.0 lb

## 2023-01-08 DIAGNOSIS — I471 Supraventricular tachycardia, unspecified: Secondary | ICD-10-CM

## 2023-01-08 DIAGNOSIS — Z6835 Body mass index (BMI) 35.0-35.9, adult: Secondary | ICD-10-CM

## 2023-01-08 DIAGNOSIS — R0789 Other chest pain: Secondary | ICD-10-CM | POA: Diagnosis not present

## 2023-01-08 DIAGNOSIS — R002 Palpitations: Secondary | ICD-10-CM

## 2023-01-08 DIAGNOSIS — M5412 Radiculopathy, cervical region: Secondary | ICD-10-CM

## 2023-01-08 NOTE — Progress Notes (Signed)
Office Visit    Patient Name: Sheri Johnson Date of Encounter: 01/08/2023  PCP:  Porfirio Oar, PA   [facility]  Cardiologist:  Sheri Millers, MD  Advanced Practice Provider:  No care team member to display Electrophysiologist:  Sheri Bunting, MD      Chief Complaint    Sheri Johnson is a 37 y.o. female presents today for follow up after ED visit for chest pain.  Past Medical History    Past Medical History:  Diagnosis Date   Abortion 11/14/2021   Anxiety    Asthma    Frequent headaches    Hypertension    Hypothyroid    SVT (supraventricular tachycardia)    Past Surgical History:  Procedure Laterality Date   CESAREAN SECTION     x2   LAPAROSCOPIC APPENDECTOMY N/A 12/14/2016   Procedure: APPENDECTOMY LAPAROSCOPIC;  Surgeon: Griselda Miner, MD;  Location: [facility] OR;  Service: General;  Laterality: N/A;   SVT ABLATION N/A 03/31/2022   Procedure: SVT ABLATION;  Surgeon: Marinus Maw, MD;  Location: [facility] INVASIVE CV LAB;  Service: Cardiovascular;  Laterality: N/A;    Allergies  Allergies  Allergen Reactions   Metoprolol Other (See Comments)    dizziness   Cephalexin Diarrhea   Cipro [Ciprofloxacin Hcl] Palpitations   Prednisone Palpitations   Sulfamethoxazole-Trimethoprim Diarrhea    History of Present Illness    Sheri Johnson is a 37 y.o. female with a hx of SVT, HTN, hypothyroidism, anxiety last seen 11/17/22.  She had COVID 08/2020 and subsequently evaluated for palpitations 01/2021 with ED vsiit SVT HR >200bpm with diffuse ST depression. Previously treated for inappropriate sinus tachycardia treated with beta blocker. Echo 01/2022 normal LVEF. 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 Coronary CT A9/2023 coronary calcium score 0, dilated main pulmonary artery at 31mm suggestive of pulmonary hypertension. Seen 05/2022 recommend to gradually decrease Atenolol and utilize PRN. ED visit 06/2022 chest pain radiating to  left arm and jaw as well as dizziness, palpitations. Left without being seen. PFT 06/13/22 normal. ED 07/19/22 with arm numbness and palpitations with EKG revealing ST which improved with vagal maneuver. Seed 07/27/22 by Azalee Course, PA with recommendation to split Atenolol to 25mg  BID.   At visit 10/09/2022 due to LE edema, dyspnea echocardiogram ordered.  Echo 10/23/2022 normal LVEF, no significant valvular abnormalities.  ABI 12/21/22 with no evidence of claudication.  Clearance was given for colonoscopy/endoscopy.  ED visit 10/14/2022 for chest pain.  Work in visit 11/17/2022 with Dr. Elease Hashimoto with chest and arm pain more consistent with musculoskeletal pain.  3-day event monitor placed due to palpitations. Wore for <24 hours due to skin irritation.  Seen in urgent care 11/19/2022 with multiple complaints left-sided neck pain, left-sided flushing, left-sided flank pain, diarrhea, dark urine recommended for evaluation in the ED but did not stay to be evaluated.    ED visit 01/03/2023 with atypical chest pain with chest tender to left upper chest.  CT with no PE and no visible coronary calcification.  No evidence of DVT by ultrasound.  Normal troponin X2.  Symptoms were felt to be related to flareup of chronic neck and left arm pain possibly exacerbated by virus in setting of travel.  Presents today for follow up independently with a myriad of concerns.  Describes prior left-sided chest pain radiating from neck down to shoulder after brought home from [place].  Reassurance provided that CT showed no PE and no coronary atherosclerosis.  Discussed that mildly  increased heart size improved from prior is not of concern.  Echocardiogram 10/2022 normal LVEF, no LVH, no significant valvular abnormalities.  She is exercising 4 times per week by doing yoga.  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  She continues to have a myriad of symptoms including elevated diastolic blood pressure readings at home.  Discussed home  automatic cuff likely reading falsely because blood pressure has been well-controlled in clinic visits.  Also checking blood pressure when she wakes from sleep with readings 90s over 60s without symptoms-discussed that it is normal for heart rate and blood pressure to lower during sleep.  She also notes GI upset with gas pain has upcoming colonoscopy/endoscopy Friday of this week.  Notes many of her symptoms are exacerbated during her menstrual cycle and has previously been evaluated by endocrinology but was not pleased with workup and has deferred further visits with that provider.  Upcoming visit with pulmonology to discuss repeat sleep study.  Prior 03/2022 did reveal sleep apnea but not enough to require CPAP however given worsening daytime somnolence, waking with a headache primary care has referred her for repeat study.  EKGs/Labs/Other Studies Reviewed:   The following studies were reviewed today: Cardiac Studies & Procedures       ECHOCARDIOGRAM  ECHOCARDIOGRAM COMPLETE 10/23/2022  Narrative ECHOCARDIOGRAM REPORT    Patient Name:   Sheri Johnson Date of Exam: 10/23/2022 Medical Rec #:  [id number]       Height:       69.0 in Accession #:    [id number]      Weight:       240.0 lb Date of Birth:  Nov 08, 1985       BSA:          2.232 m Patient Age:    37 years        BP:           140/71 mmHg Patient Gender: F               HR:           75 bpm. Exam Location:  [address]  Procedure: 2D Echo, Cardiac Doppler, Color Doppler, 3D Echo and Strain Analysis  Indications:    Dyspnea R06.00; SVT  History:        Patient has prior history of Echocardiogram examinations, most recent 01/31/2022. Risk Factors:Hypertension.  Sonographer:    Thurman Coyer RDCS Referring Phys: [id number] Sheri Johnson S Anaid Haney  IMPRESSIONS   1. Left ventricular ejection fraction, by estimation, is 60 to 65%. Left ventricular ejection fraction by 3D volume is 60 %. The left ventricle has normal function. The  left ventricle has no regional wall motion abnormalities. Left ventricular diastolic parameters were normal. The average left ventricular global longitudinal strain is -20.9 %. The global longitudinal strain is normal. 2. Right ventricular systolic function is normal. The right ventricular size is normal. There is normal pulmonary artery systolic pressure. The estimated right ventricular systolic pressure is 21.1 mmHg. 3. The mitral valve is normal in structure. Trivial mitral valve regurgitation. No evidence of mitral stenosis. 4. The aortic valve is tricuspid. Aortic valve regurgitation is not visualized. No aortic stenosis is present. 5. The inferior vena cava is normal in size with greater than 50% respiratory variability, suggesting right atrial pressure of 3 mmHg.  FINDINGS Left Ventricle: Left ventricular ejection fraction, by estimation, is 60 to 65%. Left ventricular ejection fraction by 3D volume is 60 %. The left ventricle has normal function. The left ventricle  has no regional wall motion abnormalities. The average left ventricular global longitudinal strain is -20.9 %. The global longitudinal strain is normal. The left ventricular internal cavity size was normal in size. There is borderline left ventricular hypertrophy. Left ventricular diastolic parameters were normal.  Right Ventricle: The right ventricular size is normal. No increase in right ventricular wall thickness. Right ventricular systolic function is normal. There is normal pulmonary artery systolic pressure. The tricuspid regurgitant velocity is 2.13 m/s, and with an assumed right atrial pressure of 3 mmHg, the estimated right ventricular systolic pressure is 21.1 mmHg.  Left Atrium: Left atrial size was normal in size.  Right Atrium: Right atrial size was normal in size.  Pericardium: There is no evidence of pericardial effusion.  Mitral Valve: The mitral valve is normal in structure. Trivial mitral valve regurgitation.  No evidence of mitral valve stenosis.  Tricuspid Valve: The tricuspid valve is normal in structure. Tricuspid valve regurgitation is trivial. No evidence of tricuspid stenosis.  Aortic Valve: The aortic valve is tricuspid. Aortic valve regurgitation is not visualized. No aortic stenosis is present.  Pulmonic Valve: The pulmonic valve was normal in structure. Pulmonic valve regurgitation is trivial. No evidence of pulmonic stenosis.  Aorta: The aortic root is normal in size and structure.  Venous: The inferior vena cava is normal in size with greater than 50% respiratory variability, suggesting right atrial pressure of 3 mmHg.  IAS/Shunts: No atrial level shunt detected by color flow Doppler.   LEFT VENTRICLE PLAX 2D LVIDd:         5.00 cm         Diastology LVIDs:         3.10 cm         LV e' medial:    8.49 cm/s LV PW:         1.00 cm         LV E/e' medial:  7.2 LV IVS:        1.00 cm         LV e' lateral:   13.50 cm/s LVOT diam:     2.20 cm         LV E/e' lateral: 4.5 LV SV:         55 LV SV Index:   25              2D LVOT Area:     3.80 cm        Longitudinal Strain 2D Strain GLS  -20.9 % Avg:  3D Volume EF LV 3D EF:    Left ventricul ar ejection fraction by 3D volume is 60 %.  3D Volume EF: 3D EF:        60 % LV EDV:       101 ml LV ESV:       41 ml LV SV:        60 ml  RIGHT VENTRICLE RV Basal diam:  3.10 cm RV Mid diam:    2.80 cm RV S prime:     12.20 cm/s TAPSE (M-mode): 2.6 cm  LEFT ATRIUM             Index        RIGHT ATRIUM           Index LA diam:        3.80 cm 1.70 cm/m   RA Area:     12.20 cm LA Vol (A2C):   74.5 ml 33.37 ml/m  RA Volume:  22.90 ml  10.26 ml/m LA Vol (A4C):   63.4 ml 28.40 ml/m LA Biplane Vol: 75.1 ml 33.64 ml/m AORTIC VALVE LVOT Vmax:   74.70 cm/s LVOT Vmean:  48.400 cm/s LVOT VTI:    0.144 m  AORTA Ao Root diam: 3.10 cm Ao Asc diam:  3.50 cm  MITRAL VALVE               TRICUSPID VALVE MV Area (PHT): 3.12  cm    TR Peak grad:   18.1 mmHg MV Decel Time: 243 msec    TR Vmax:        213.00 cm/s MV E velocity: 60.80 cm/s MV A velocity: 39.40 cm/s  SHUNTS MV E/A ratio:  1.54        Systemic VTI:  0.14 m Systemic Diam: 2.20 cm  Weston Brass MD Electronically signed by Weston Brass MD Signature Date/Time: 10/23/2022/4:04:57 PM    Final    MONITORS  LONG TERM MONITOR (3-14 DAYS) 11/18/2021  Narrative Patch Wear Time:  1 days and 10 hours (2023-03-25T12:18:43-0400 to 2023-03-26T22:20:23-0400)  Patient had a min HR of 60 bpm, max HR of 155 bpm, and avg HR of 91 bpm. Predominant underlying rhythm was Sinus Rhythm. Isolated SVEs were rare (<1.0%), and no SVE Couplets or SVE Triplets were present. Isolated VEs were rare (<1.0%), and no VE Couplets or VE Triplets were present.  Summary and conclusions: Normal monitor   CT SCANS  CT CORONARY MORPH W/CTA COR W/SCORE 05/04/2022  Addendum 05/04/2022 10:37 AM ADDENDUM REPORT: 05/04/2022 10:35  ADDENDUM: The following report is an over-read performed by radiologist Dr. Maudry Mayhew of [facility] on May 04 2022. This over-read does not include interpretation of cardiac or coronary anatomy or pathology. The coronary calcium score/coronary CTA interpretation by the cardiologist is attached.  COMPARISON:  Chest CT January 30, 2022  FINDINGS: Vascular: No acute non-cardiac vascular finding.  Mediastinum/Nodes: No pathologically enlarged mediastinal, or hilar lymph nodes in the visualized portions of the thorax. Evaluated portions of the esophagus are grossly unremarkable.  Lungs/Pleura: Within the visualized portions of the thorax there are no suspicious appearing pulmonary nodules or masses, there is no acute consolidative airspace disease, no pleural effusions and no pneumothorax  Upper Abdomen: Visualized portions of the upper abdomen are unremarkable.  Musculoskeletal: No acute osseous  abnormality.  IMPRESSION: No significant incidental noncardiac finding noted.   Electronically Signed By: Maudry Mayhew M.D. On: 05/04/2022 10:35  Narrative HISTORY: 37 yo female with chest pain/anginal equiv, intermediate CAD risk, not treadmill candidate Dyspnea on exertion (DOE)  EXAM: Cardiac/Coronary CTA  TECHNIQUE: The patient was scanned on a [employer].  PROTOCOL: A 90 kV prospective scan was triggered in the descending thoracic aorta at 111 HU's. Axial non-contrast 3 mm slices were carried out through the heart. The data set was analyzed on a dedicated work station and scored using the Agatson method. Gantry rotation speed was 250 msecs and collimation was .6 mm. Beta blockade and 0.8 mg of sl NTG was given. The 3D data set was reconstructed in 5% intervals of the 35-75 % of the R-R cycle. Diastolic phases were analyzed on a dedicated work station using MPR, MIP and VRT modes. The patient received 95 ml OMNIPAQUE of contrast.  FINDINGS: Quality: Fair, attenuation artifact, HR 75  Coronary calcium score: The patient's coronary artery calcium score is 0, which places the patient in the 0 percentile.  Coronary arteries: Normal coronary origins.  Right dominance.  Right  Coronary Artery: Dominant.  Normal vessel.  Left Main Coronary Artery: Normal. Bifurcates into the LAD and LCx arteries.  Left Anterior Descending Coronary Artery: Large anterior artery, reaches around the apex. No disease. 2 diagonal branches without disease.  Left Circumflex Artery: AV groove vessel, no disease. Large OM branch without disease.  Aorta: Normal size, 31 mm at the mid ascending aorta (level of the PA bifurcation) measured double oblique. No calcifications. No dissection.  Aortic Valve: Trileaflet. No calcifications.  Other findings:  Normal pulmonary vein drainage into the left atrium.  Normal left atrial appendage without a thrombus.  Dilated main  pulmonary artery to 31 mm, suggestive of pulmonary hypertension.  IMPRESSION: 1. No evidence of CAD, CADRADS = 0.  2. Coronary calcium score of 0. This was 0 percentile for age and sex matched control.  3. Normal coronary origin with right dominance.  4. Dilated main pulmonary artery to 31 mm, suggestive of pulmonary hypertension.  5. Consider non-coronary causes of chest pain.  Electronically Signed: By: Chrystie Nose M.D. On: 05/04/2022 10:13           EKG:  EKG is ordered today.  The ekg ordered today demonstrates NSR 72 bpm with no acute ST/T wave changes.   Recent Labs: 01/30/2022: B Natriuretic Peptide 24.5 04/21/2022: TSH 7.761 10/18/2022: Magnesium 2.0 01/03/2023: ALT 50; BUN 11; Creatinine, Ser 0.89; Hemoglobin 13.7; Platelets 291; Potassium 4.3; Sodium 137  Recent Lipid Panel No results found for: "CHOL", "TRIG", "HDL", "CHOLHDL", "VLDL", "LDLCALC", "LDLDIRECT"  Home Medications   Current Meds  Medication Sig   albuterol (VENTOLIN HFA) 108 (90 Base) MCG/ACT inhaler Inhale 2 puffs into the lungs every 6 (six) hours as needed for wheezing or shortness of breath.   ALPRAZolam (XANAX XR) 0.5 MG 24 hr tablet Take by mouth.   Ascorbic Acid (VITAMIN C) 1000 MG tablet Take 1,000 mg by mouth daily.   atenolol (TENORMIN) 25 MG tablet Take 1 tablet (25 mg total) by mouth 2 (two) times daily.   b complex vitamins capsule Take 1 capsule by mouth daily.   cetirizine (ZYRTEC) 10 MG tablet Take 10 mg by mouth 2 (two) times daily.   clobetasol (TEMOVATE) 0.05 % external solution Apply 1 Application topically at bedtime. Apply to scalp   Digestive Enzymes CAPS Take 1 capsule by mouth 2 (two) times daily before a meal.   ferrous sulfate 325 (65 FE) MG EC tablet Take 1 tablet (325 mg total) by mouth daily with breakfast.   fluticasone (FLONASE) 50 MCG/ACT nasal spray Place 1 spray into both nostrils daily as needed for allergies or rhinitis.   fluticasone (FLOVENT HFA) 44 MCG/ACT  inhaler Inhale 1 puff into the lungs 2 (two) times daily.   ibuprofen (ADVIL) 800 MG tablet Take 800 mg by mouth every 8 (eight) hours as needed for moderate pain.   levothyroxine (SYNTHROID) 100 MCG tablet Take 100 mcg by mouth every morning.   loperamide (IMODIUM) 2 MG capsule Take 1 capsule (2 mg total) by mouth 2 (two) times daily as needed for diarrhea or loose stools.   Magnesium 400 MG CAPS Take 400 mg by mouth daily as needed (Low Magnesium).   OVER THE COUNTER MEDICATION Take 2 tablets by mouth daily. Medication: Beet Root Supplement   potassium chloride SA (KLOR-CON M) 20 MEQ tablet Take 1 tablet (20 mEq total) by mouth daily.   Probiotic Product (PROBIOTIC DAILY PO) Take 2 each by mouth at bedtime.   promethazine (PHENERGAN) 25 MG tablet  Take 1 tablet (25 mg total) by mouth every 6 (six) hours as needed for nausea or vomiting.   TURMERIC CURCUMIN PO Take 2 capsules by mouth daily.     Review of Systems      All other systems reviewed and are otherwise negative except as noted above.  Physical Exam    VS:  BP 122/88   Pulse 72   Ht 5\' 9"  (1.753 m)   Wt 242 lb (109.8 kg)   LMP 12/29/2022 (Exact Date)   BMI 35.74 kg/m  , BMI Body mass index is 35.74 kg/m.  Wt Readings from Last 3 Encounters:  01/08/23 242 lb (109.8 kg)  01/03/23 240 lb (108.9 kg)  11/17/22 245 lb 3.2 oz (111.2 kg)     GEN: Well nourished, overweight, well developed, in no acute distress. HEENT: normal. Neck: Supple, no JVD, carotid bruits, or masses. Cardiac: RRR, no murmurs, rubs, or gallops. No clubbing, cyanosis, edema.  Radials/PT 2+ and equal bilaterally.  Respiratory:  Respirations regular and unlabored, clear to auscultation bilaterally. GI: Soft, nontender, nondistended. MS: No deformity or atrophy. Skin: Warm and dry, no rash. Neuro:  Strength and sensation are intact. Psych: Normal affect.  Assessment & Plan    Preop clearance - Per AHA/ACC guidelines, she is deemed acceptable risk for  the planned procedure (colonoscopy/endoscopy) without additional cardiovascular testing. Previously routed to GI.  Atypical chest pain - 04/2022 Coronary calcium score of 0. CT 12/2022 with no coronary calcification. Chest pain atypical as occurs at rest or with activity. EKG no acute ST/T wave changes. No indication for further ischemic workup. Discussed pain likely related to known cervical radiculopathy or gas and has endoscopy/colonoscopy upcoming later this week. She is working to get sooner appt with neurology regarding cervical radiculopathy. Reassurance provided no cardiac cause.  Hypothyroidism due to Hashimoto's thyroiditis - Follows with PCP. No longer following with endocrinology.   Cervical radiculopathy - Continue to follow with PCP, neurology.. On gabapentin but only taking PRN. Very hesitant regarding medications.. Contributory to left arm and neck pain. No ischemic evaluation recommended, as above.   Anxiety - on Xanax XR per PCP. Hesitant about SSRI due to potential weight gain.   Palpitations / SVT - s/p ablation.  Difficulty tolerating monitor due to adhesive.  Continue atenolol 25 mg twice daily.  Encouraged to avoid caffeine, manage stress/anxiety well, stay hydrated. Continue Atenolol 25mg  BID.  Prior intolerance to metoprolol.  Lower extremity pain -ABI and lower extremity arterial duplex 12/21/2022 with no evidence of PAD nor claudication.  Further workup per PCP if necessary.  Obesity - Weight loss via diet and exercise encouraged. Discussed the impact being overweight would have on cardiovascular risk. Reports difficulty getting under 240 pounds. Hesitant to add GLP1 due to present GI issues and she prefers lifestyle changes. Recommend further follow up with PCP. Given information for [facility] if she is interested which does not require referral.        Disposition: Follow up  as scheduled  with Sheri Millers, MD per her preference.  Signed, Alver Sorrow, NP 01/08/2023, 11:44 AM [facility]

## 2023-01-08 NOTE — Patient Instructions (Addendum)
Medication Instructions:   Continue your current medications.   *If you need a refill on your cardiac medications before your next appointment, please call your pharmacy*  Follow-Up: At [facility], you and your health needs are our priority.  As part of our continuing mission to provide you with exceptional heart care, we have created designated Provider Care Teams.  These Care Teams include your primary Cardiologist (physician) and Advanced Practice Providers (APPs -  Physician Assistants and Nurse Practitioners) who all work together to provide you with the care you need, when you need it.  We recommend signing up for the patient portal called "MyChart".  Sign up information is provided on this After Visit Summary.  MyChart is used to connect with patients for Virtual Visits (Telemedicine).  Patients are able to view lab/test results, encounter notes, upcoming appointments, etc.  Non-urgent messages can be sent to your provider as well.   To learn more about what you can do with MyChart, go to [email or web address].    Your next appointment:   As scheduled with Dr. Delorise Shiner to reschedule this if you feel symptoms are well controlled after your colonoscopy/endoscopy  Other Instructions  Your CT scan in the ED 12/2022 showed no plaque build up in your heart arteries.  Your arm or chest pain is likely related to your cervical radiculopathy (pinched nerve in your neck), gas or gastrointestinal issue (which gastroenterology will follow), or musculoskeletal pain. ________________  [facility] [address] [place], [place] [address] Phone: [phone] No referral required, can simply call to schedule an appointment ________________

## 2023-01-15 NOTE — Progress Notes (Unsigned)
@Patient  ID: Sheri Johnson, female    DOB: 1986-02-24, 37 y.o.   MRN: [id number]  No chief complaint on file.   Referring provider: Porfirio Oar, PA  HPI: 37 year old female, never smoked.  Past medical history significant for pretension, SVT, palpitations, appendicitis.  01/16/2023 Patient presents today for sleep consult.      Sleep questionnaire Symptoms-    Prior sleep study-  Bedtime- Time to fall asleep-  Nocturnal awakenings-  Out of bed/start of day-  Weight changes-  Do you operate heavy machinery-  Do you currently wear CPAP-  Do you current wear oxygen-  [place]-   Allergies  Allergen Reactions   Metoprolol Other (See Comments)    dizziness   Cephalexin Diarrhea   Cipro [Ciprofloxacin Hcl] Palpitations   Prednisone Palpitations   Sulfamethoxazole-Trimethoprim Diarrhea     There is no immunization history on file for this patient.  Past Medical History:  Diagnosis Date   Abortion 11/14/2021   Anxiety    Asthma    Frequent headaches    Hypertension    Hypothyroid    SVT (supraventricular tachycardia)     Tobacco History: Social History   Tobacco Use  Smoking Status Never  Smokeless Tobacco Never   Counseling given: Not Answered   Outpatient Medications Prior to Visit  Medication Sig Dispense Refill   albuterol (VENTOLIN HFA) 108 (90 Base) MCG/ACT inhaler Inhale 2 puffs into the lungs every 6 (six) hours as needed for wheezing or shortness of breath.     ALPRAZolam (XANAX XR) 0.5 MG 24 hr tablet Take by mouth.     Ascorbic Acid (VITAMIN C) 1000 MG tablet Take 1,000 mg by mouth daily.     atenolol (TENORMIN) 25 MG tablet Take 1 tablet (25 mg total) by mouth 2 (two) times daily. 180 tablet 3   b complex vitamins capsule Take 1 capsule by mouth daily.     cetirizine (ZYRTEC) 10 MG tablet Take 10 mg by mouth 2 (two) times daily.     clobetasol (TEMOVATE) 0.05 % external solution Apply 1 Application topically at bedtime. Apply to scalp      Digestive Enzymes CAPS Take 1 capsule by mouth 2 (two) times daily before a meal.     ferrous sulfate 325 (65 FE) MG EC tablet Take 1 tablet (325 mg total) by mouth daily with breakfast. 30 tablet 6   fluticasone (FLONASE) 50 MCG/ACT nasal spray Place 1 spray into both nostrils daily as needed for allergies or rhinitis.     fluticasone (FLOVENT HFA) 44 MCG/ACT inhaler Inhale 1 puff into the lungs 2 (two) times daily.     ibuprofen (ADVIL) 800 MG tablet Take 800 mg by mouth every 8 (eight) hours as needed for moderate pain.     levothyroxine (SYNTHROID) 100 MCG tablet Take 100 mcg by mouth every morning.     loperamide (IMODIUM) 2 MG capsule Take 1 capsule (2 mg total) by mouth 2 (two) times daily as needed for diarrhea or loose stools. 14 capsule 0   Magnesium 400 MG CAPS Take 400 mg by mouth daily as needed (Low Magnesium).     OVER THE COUNTER MEDICATION Take 2 tablets by mouth daily. Medication: Beet Root Supplement     potassium chloride SA (KLOR-CON M) 20 MEQ tablet Take 1 tablet (20 mEq total) by mouth daily. 30 tablet 6   Probiotic Product (PROBIOTIC DAILY PO) Take 2 each by mouth at bedtime.     promethazine (PHENERGAN) 25 MG tablet  Take 1 tablet (25 mg total) by mouth every 6 (six) hours as needed for nausea or vomiting. 30 tablet 0   TURMERIC CURCUMIN PO Take 2 capsules by mouth daily.     No facility-administered medications prior to visit.      Review of Systems  Review of Systems   Physical Exam  LMP 12/29/2022 (Exact Date)  Physical Exam   Lab Results:  CBC    Component Value Date/Time   WBC 8.3 01/03/2023 1941   RBC 4.74 01/03/2023 1941   HGB 13.7 01/03/2023 1941   HGB 13.8 03/23/2022 1433   HCT 39.9 01/03/2023 1941   HCT 40.0 03/23/2022 1433   PLT 291 01/03/2023 1941   PLT 285 03/23/2022 1433   MCV 84.2 01/03/2023 1941   MCV 83 03/23/2022 1433   MCH 28.9 01/03/2023 1941   MCHC 34.3 01/03/2023 1941   RDW 13.1 01/03/2023 1941   RDW 13.2 03/23/2022  1433   LYMPHSABS 1.2 05/14/2022 2205   LYMPHSABS 2.2 03/23/2022 1433   MONOABS 0.5 05/14/2022 2205   EOSABS 0.1 05/14/2022 2205   EOSABS 0.2 03/23/2022 1433   BASOSABS 0.0 05/14/2022 2205   BASOSABS 0.0 03/23/2022 1433    BMET    Component Value Date/Time   NA 137 01/03/2023 1941   NA 140 10/27/2022 1523   K 4.3 01/03/2023 1941   CL 104 01/03/2023 1941   CO2 22 01/03/2023 1941   GLUCOSE 108 (H) 01/03/2023 1941   BUN 11 01/03/2023 1941   BUN 8 10/27/2022 1523   CREATININE 0.89 01/03/2023 1941   CALCIUM 8.8 (L) 01/03/2023 1941   GFRNONAA >60 01/03/2023 1941   GFRAA >60 12/13/2016 2158    BNP    Component Value Date/Time   BNP 24.5 01/30/2022 1719    ProBNP No results found for: "PROBNP"  Imaging: CT Angio Chest PE W and/or Wo Contrast  Result Date: 01/03/2023 CLINICAL DATA:  Pulmonary embolism suspected.  High probability. EXAM: CT ANGIOGRAPHY CHEST WITH CONTRAST TECHNIQUE: Multidetector CT imaging of the chest was performed using the standard protocol during bolus administration of intravenous contrast. Multiplanar CT image reconstructions and MIPs were obtained to evaluate the vascular anatomy. RADIATION DOSE REDUCTION: This exam was performed according to the departmental dose-optimization program which includes automated exposure control, adjustment of the mA and/or kV according to patient size and/or use of iterative reconstruction technique. CONTRAST:  75mL OMNIPAQUE IOHEXOL 350 MG/ML SOLN COMPARISON:  PA and lateral chest today, PA and lateral chest 10/14/2022, partial chest CT with contrast for coronary CTA dated 05/04/2022, CTA chest 01/30/2022 FINDINGS: Cardiovascular: The heart is slightly enlarged but less enlarged than previously. No pericardial effusion is seen. There are no visible coronary artery calcifications. The aorta and great vessels are normal. The pulmonary arteries and veins are normal in caliber no arterial embolism is seen. Mediastinum/Nodes: No  enlarged mediastinal, hilar, or axillary lymph nodes. Thyroid gland, trachea, and esophagus demonstrate no significant findings. Both main bronchi are clear. Lungs/Pleura: There are chronic linear scar-like opacities in the lingular base. There are mild features of posterior atelectasis. There are no pulmonary infiltrates or nodules and no pleural effusion, thickening or pneumothorax. Mild chronic elevation right hemidiaphragm. Musculoskeletal: There is thoracic spondylosis without acute or other significant osseous findings. The ribcage is intact. No aggressive process is seen. No focal abnormality in the chest wall. Upright abdomen: No acute abnormality.  Mild hepatic steatosis. Review of the MIP images confirms the above findings. IMPRESSION: 1. No acute chest CT  or CTA findings. 2. Mild cardiomegaly. No visible atherosclerosis in the aorta and coronary arteries. 3. Mild hepatic steatosis. Electronically Signed   By: Almira Bar M.D.   On: 01/03/2023 22:09   US Venous Img Lower Unilateral Left  Result Date: 01/03/2023 CLINICAL DATA:  Recent travel with left lower extremity pain. EXAM: LEFT LOWER EXTREMITY VENOUS DOPPLER ULTRASOUND TECHNIQUE: Gray-scale sonography with compression, as well as color and duplex ultrasound, were performed to evaluate the deep venous system(s) from the level of the common femoral vein through the popliteal and proximal calf veins. COMPARISON:  None Available. FINDINGS: VENOUS Normal compressibility of the common femoral, superficial femoral, and popliteal veins, as well as the visualized calf veins. Visualized portions of profunda femoral vein and great saphenous vein unremarkable. No filling defects to suggest DVT on grayscale or color Doppler imaging. Doppler waveforms show normal direction of venous flow, normal respiratory plasticity and response to augmentation. Limited views of the contralateral common femoral vein are unremarkable. OTHER None. Limitations: none  IMPRESSION: Negative. Electronically Signed   By: Aram Candela M.D.   On: 01/03/2023 21:50   DG Chest 2 View  Result Date: 01/03/2023 CLINICAL DATA:  Chest pain EXAM: CHEST - 2 VIEW COMPARISON:  10/14/2022 FINDINGS: The heart size and mediastinal contours are within normal limits. Both lungs are clear. The visualized skeletal structures are unremarkable. IMPRESSION: No active cardiopulmonary disease. Electronically Signed   By: Ernie Avena M.D.   On: 01/03/2023 20:03   VAS [place] LOWER EXTREMITY ARTERIAL DUPLEX  Result Date: 12/22/2022 LOWER EXTREMITY ARTERIAL DUPLEX STUDY Patient Name:  Sheri Johnson  Date of Exam:   12/21/2022 Medical Rec #: [id number]        Accession #:    [id number] Date of Birth: 10-11-1985        Patient Gender: F Patient Age:   49 years Exam Location:  [facility] Procedure:      VAS [place] LOWER EXTREMITY ARTERIAL DUPLEX Referring Phys: Gillian Shields --------------------------------------------------------------------------------  Indications: Claudication per order. Patient reports worsening numbness and              tingling in both feet and hands for over one year. These symptoms              occur while resting and sitting. She report the symptoms also occur              during certain yoga positions. She states when she is walking she              begins to notice that both feet will become swollen and she can              feel heat from them and at this point she feels like her shoes have              become tighten causing the numbness to start in her feet. The              longer she walks the more her feet as well as both knees will              become swollen, redden, hot to the touch and will begin to throb.              She also states she sits a lot and has began to notice that the              longer she sits the lower legs and feet will be  purple and after              elevation the discoloration dissipated. She does not express any              clinical  claudication symptoms or rest pain. High Risk Factors: Hypertension, no history of smoking.  Current ABI: 1.16 on the right and 1.15 on the left Comparison Study: NA Performing Technologist: Tyna Jaksch RVT  Examination Guidelines: A complete evaluation includes B-mode imaging, spectral Doppler, color Doppler, and power Doppler as needed of all accessible portions of each vessel. Bilateral testing is considered an integral part of a complete examination. Limited examinations for reoccurring indications may be performed as noted.  +-----------+--------+-----+--------+---------+--------+ RIGHT      PSV cm/sRatioStenosisWaveform Comments +-----------+--------+-----+--------+---------+--------+ CFA Prox   154                  triphasic         +-----------+--------+-----+--------+---------+--------+ SFA Prox   85                   triphasic         +-----------+--------+-----+--------+---------+--------+ SFA Mid    99                   triphasic         +-----------+--------+-----+--------+---------+--------+ SFA Distal 90                   triphasic         +-----------+--------+-----+--------+---------+--------+ POP Prox   81                   triphasic         +-----------+--------+-----+--------+---------+--------+ POP Mid    53                   triphasic         +-----------+--------+-----+--------+---------+--------+ POP Distal 49                   triphasic         +-----------+--------+-----+--------+---------+--------+ TP Trunk   54                   triphasic         +-----------+--------+-----+--------+---------+--------+ ATA Distal 46                   triphasic         +-----------+--------+-----+--------+---------+--------+ PTA Distal 72                   triphasic         +-----------+--------+-----+--------+---------+--------+ PERO Distal30                   triphasic          +-----------+--------+-----+--------+---------+--------+  +-----------+--------+-----+--------+---------+--------+ LEFT       PSV cm/sRatioStenosisWaveform Comments +-----------+--------+-----+--------+---------+--------+ CFA Prox   128                  triphasic         +-----------+--------+-----+--------+---------+--------+ SFA Prox   103                  triphasic         +-----------+--------+-----+--------+---------+--------+ SFA Mid    103                  triphasic         +-----------+--------+-----+--------+---------+--------+ SFA Distal 84  triphasic         +-----------+--------+-----+--------+---------+--------+ POP Prox   74                   triphasic         +-----------+--------+-----+--------+---------+--------+ POP Mid    51                   triphasic         +-----------+--------+-----+--------+---------+--------+ POP Distal 49                   triphasic         +-----------+--------+-----+--------+---------+--------+ TP Trunk   70                   triphasic         +-----------+--------+-----+--------+---------+--------+ ATA Distal 45                   triphasic         +-----------+--------+-----+--------+---------+--------+ PTA Distal 69                   triphasic         +-----------+--------+-----+--------+---------+--------+ PERO Distal42                   triphasic         +-----------+--------+-----+--------+---------+--------+  Summary: Right: Widely patent CFA, SFA, popliteal artery, TPT without evidence of obstruction. Three vessel run-off. Left: Widely patent CFA, SFA, popliteal artery, TPT without evidence of obstruction. Three vessel run-off.  See table(s) above for measurements and observations. See ABI report. Electronically signed by Lorine Bears MD on 12/22/2022 at 3:15:28 PM.    Final    VAS [place] ABI WITH/WO TBI  Result Date: 12/22/2022  LOWER EXTREMITY DOPPLER STUDY  Patient Name:  Sheri Johnson  Date of Exam:   12/21/2022 Medical Rec #: [id number]        Accession #:    [id number] Date of Birth: 03-24-1986        Patient Gender: F Patient Age:   53 years Exam Location:  [facility] Procedure:      VAS [place] ABI WITH/WO TBI Referring Phys: Gillian Shields --------------------------------------------------------------------------------  Indications: Claudication per order. Patient reports worsening numbness and              tingling in both feet and hands for over one year. These symptoms              occur while resting and sitting. She report the symptoms also occur              during certain yoga positions. She states when she is walking she              begins to notice that both feet will become swollen and she can              feel heat from them and at this point she feels like her shoes have              become tighten causing the numbness to start in her feet. The              longer she walks the more her feet as well as both knees will              become swollen, redden, hot to the touch and will begin to throb.  She also states she sits a lot and has began to notice that the              longer she sits the lower legs and feet will be purple and after              elevation the discoloration dissipated. She does not express any              clinical claudication symptoms or rest pain. High Risk Factors: Hypertension, no history of smoking.  Comparison Study: NA Performing Technologist: Tyna Jaksch RVT  Examination Guidelines: A complete evaluation includes at minimum, Doppler waveform signals and systolic blood pressure reading at the level of bilateral brachial, anterior tibial, and posterior tibial arteries, when vessel segments are accessible. Bilateral testing is considered an integral part of a complete examination. Photoelectric Plethysmograph (PPG) waveforms and toe systolic pressure readings are included as required and additional duplex testing as  needed. Limited examinations for reoccurring indications may be performed as noted.  ABI Findings: +---------+------------------+-----+---------+--------+ Right    Rt Pressure (mmHg)IndexWaveform Comment  +---------+------------------+-----+---------+--------+ Brachial 129                                      +---------+------------------+-----+---------+--------+ PTA      149               1.16 triphasic         +---------+------------------+-----+---------+--------+ DP       143               1.11 triphasic         +---------+------------------+-----+---------+--------+ Great Toe141               1.09 Normal            +---------+------------------+-----+---------+--------+ +---------+------------------+-----+---------+-------+ Left     Lt Pressure (mmHg)IndexWaveform Comment +---------+------------------+-----+---------+-------+ Brachial 118                                     +---------+------------------+-----+---------+-------+ PTA      148               1.15 triphasic        +---------+------------------+-----+---------+-------+ DP       141               1.09 triphasic        +---------+------------------+-----+---------+-------+ Great Toe103               0.80 Normal           +---------+------------------+-----+---------+-------+ +-------+-----------+-----------+------------+------------+ ABI/TBIToday's ABIToday's TBIPrevious ABIPrevious TBI +-------+-----------+-----------+------------+------------+ Right  1.16       1.09                                +-------+-----------+-----------+------------+------------+ Left   1.15       .80                                 +-------+-----------+-----------+------------+------------+   Summary: Right: Resting right ankle-brachial index is within normal range. The right toe-brachial index is normal. Left: Resting left ankle-brachial index is within normal range. The left toe-brachial index  is normal. *See table(s) above for measurements and observations. See LE  Arterial duplex report. Electronically signed by Lorine Bears MD on 12/22/2022 at 3:14:56 PM.    Final      Assessment & Plan:   No problem-specific Assessment & Plan notes found for this encounter.     Glenford Bayley, NP 01/15/2023

## 2023-01-15 NOTE — Patient Instructions (Signed)

## 2023-01-16 ENCOUNTER — Encounter: Payer: Self-pay | Admitting: Primary Care

## 2023-01-16 ENCOUNTER — Ambulatory Visit (INDEPENDENT_AMBULATORY_CARE_PROVIDER_SITE_OTHER): Payer: Managed Care, Other (non HMO) | Admitting: Primary Care

## 2023-01-16 VITALS — BP 112/74 | HR 71 | Ht 69.0 in | Wt 240.0 lb

## 2023-01-16 DIAGNOSIS — R0681 Apnea, not elsewhere classified: Secondary | ICD-10-CM

## 2023-01-16 NOTE — Assessment & Plan Note (Addendum)
Patient has symptoms of loud snoring, witnessed apnea, waking up gasping for air, palpitations and morning headaches.  She had a polysomnography in August 2023 that showed rare respiratory events and insufficient AHI 1.7/hour.  During last REM stage she showed significant obstructive hypopneas with oxygen desaturation seen to nadir of 76%.  Patient continues to have symptoms concerning for sleep apnea, recommend getting home sleep study to evaluate.  Reviewed risks of untreated sleep apnea and treatment options including weight loss, oral device or CPAP. She has been evaluated by ENT and does have nasal septal deviation.  Encourage side sleeping position or elevate head of bed 30 degrees.  Advised against driving if experiencing excessive daytime sleepiness fatigue. If HST negative for OSA, would check ONO to see if patient qualifies for nocturnal oxygen. FU 1-2 weeks after sleep study to review results or treatment options.

## 2023-01-16 NOTE — Progress Notes (Signed)
Reviewed and agree with assessment/plan.   Coralyn Helling, MD [facility] [place] [facility] 01/16/2023, 12:29 PM Pager:  [phone]

## 2023-01-17 ENCOUNTER — Other Ambulatory Visit: Payer: Self-pay | Admitting: Neurology

## 2023-01-17 DIAGNOSIS — M5386 Other specified dorsopathies, lumbar region: Secondary | ICD-10-CM

## 2023-01-17 NOTE — Progress Notes (Signed)
I received a WID message from Felipe Drone, she is a primary patient of Dr. Delena Bali. : This sounds like a radiculopathy and I will order NCV and EMG for your low back and legs. CD

## 2023-02-02 NOTE — Progress Notes (Signed)
HPI: FU SVT. Seen with palpitations 01/08/21; was in SVT (ECG with SVT 231 with diffuse ST depression); EMS terminated arrhythmia with adenosine. 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  Cardiac CTA September 2023 showed calcium score 0 and no coronary disease; dilated main pulmonary artery at 31 mm suggestive of pulmonary hypertension.  Echocardiogram March 2024 showed normal LV function.  ABIs with Doppler May 2024 normal.  Venous Dopplers May 2024 normal.  CTA May 2024 showed no acute chest findings including no pulmonary embolus.  Since last seen she continues to have problems with chest pain.  It occurs with activities but also at rest.  It is unchanged.  She also has dyspnea.  She continues to have occasional palpitations but none similar to her SVT.  Current Outpatient Medications  Medication Sig Dispense Refill   albuterol (VENTOLIN HFA) 108 (90 Base) MCG/ACT inhaler Inhale 2 puffs into the lungs every 6 (six) hours as needed for wheezing or shortness of breath.     ALPRAZolam (XANAX XR) 0.5 MG 24 hr tablet Take by mouth.     Ascorbic Acid (VITAMIN C) 1000 MG tablet Take 1,000 mg by mouth daily.     atenolol (TENORMIN) 25 MG tablet Take 1 tablet (25 mg total) by mouth 2 (two) times daily. 180 tablet 3   b complex vitamins capsule Take 1 capsule by mouth daily.     cetirizine (ZYRTEC) 10 MG tablet Take 10 mg by mouth 2 (two) times daily.     clobetasol (TEMOVATE) 0.05 % external solution Apply 1 Application topically at bedtime. Apply to scalp     Digestive Enzymes CAPS Take 1 capsule by mouth 2 (two) times daily before a meal.     ferrous sulfate 325 (65 FE) MG EC tablet Take 1 tablet (325 mg total) by mouth daily with breakfast. 30 tablet 6   gabapentin (NEURONTIN) 300 MG capsule Take 300 mg by mouth as needed.     ibuprofen (ADVIL) 800 MG tablet Take 800 mg by mouth every  8 (eight) hours as needed for moderate pain.     levothyroxine (SYNTHROID) 100 MCG tablet Take 100 mcg by mouth every morning.     Magnesium 400 MG CAPS Take 400 mg by mouth daily as needed (Low Magnesium).     omeprazole (PRILOSEC) 40 MG capsule Take 40 mg by mouth daily.     OVER THE COUNTER MEDICATION Take 2 tablets by mouth daily. Medication: Beet Root Supplement     potassium chloride SA (KLOR-CON M) 20 MEQ tablet Take 1 tablet (20 mEq total) by mouth daily. 30 tablet 6   Probiotic Product (PROBIOTIC DAILY PO) Take 2 each by mouth at bedtime.     QVAR REDIHALER 40 MCG/ACT inhaler Inhale 2 puffs into the lungs at bedtime.     TURMERIC CURCUMIN PO Take 2 capsules by mouth daily.     fluticasone (FLONASE) 50 MCG/ACT nasal spray Place 1 spray into both nostrils daily as needed for allergies or rhinitis. (Patient not taking: Reported on 02/12/2023)     fluticasone (FLOVENT HFA) 44 MCG/ACT inhaler Inhale 1 puff into the lungs 2 (two) times daily. (Patient not taking: Reported on 02/12/2023)     loperamide (IMODIUM) 2 MG capsule Take 1 capsule (2 mg total) by mouth 2 (two) times daily as needed for diarrhea or loose stools. (Patient not taking: Reported on 02/12/2023) 14 capsule  0   promethazine (PHENERGAN) 25 MG tablet Take 1 tablet (25 mg total) by mouth every 6 (six) hours as needed for nausea or vomiting. (Patient not taking: Reported on 02/12/2023) 30 tablet 0   No current facility-administered medications for this visit.     Past Medical History:  Diagnosis Date   Abortion 11/14/2021   Anxiety    Asthma    Frequent headaches    Hypertension    Hypothyroid    SVT (supraventricular tachycardia)     Past Surgical History:  Procedure Laterality Date   CESAREAN SECTION     x2   LAPAROSCOPIC APPENDECTOMY N/A 12/14/2016   Procedure: APPENDECTOMY LAPAROSCOPIC;  Surgeon: Griselda Miner, MD;  Location: [facility] OR;  Service: General;  Laterality: N/A;   SVT ABLATION N/A 03/31/2022   Procedure: SVT  ABLATION;  Surgeon: Marinus Maw, MD;  Location: [facility] INV[facility];  Service: Cardiovascular;  Laterality: N/A;    Social History   Socioeconomic History   Marital status: Single    Spouse name: Not on file   Number of children: 3   Years of education: Not on file   Highest education level: Associate degree: academic program  Occupational History   Not on file  Tobacco Use   Smoking status: Never   Smokeless tobacco: Never  Vaping Use   Vaping Use: Never used  Substance and Sexual Activity   Alcohol use: Not Currently    Comment: Rare   Drug use: Never   Sexual activity: Yes  Other Topics Concern   Not on file  Social History Narrative   Caffeine- none   Social Determinants of Health   Financial Resource Strain: Not on file  Food Insecurity: Not on file  Transportation Needs: Not on file  Physical Activity: Not on file  Stress: Not on file  Social Connections: Not on file  Intimate Partner Violence: Not on file    Family History  Problem Relation Age of Onset   Hypertension Mother    Hyperthyroidism Mother     ROS: Fatigue but no fevers or chills, productive cough, hemoptysis, dysphasia, odynophagia, melena, hematochezia, dysuria, hematuria, rash, seizure activity, orthopnea, PND, pedal edema, claudication. Remaining systems are negative.  Physical Exam: Well-developed well-nourished in no acute distress.  Skin is warm and dry.  HEENT is normal.  Neck is supple.  Chest is clear to auscultation with normal expansion.  Cardiovascular exam is regular rate and rhythm.  Abdominal exam nontender or distended. No masses palpated. Extremities show no edema. neuro grossly intact   A/P  1 SVT-will continue beta-blocker at present dose.  Previously weaned but had some palpitations.  She is status post ablation of AV nodal reentrant tachycardia.  2 hypertension-patient's blood pressure is controlled.  Continue present medications and follow-up.  3 history of  chest pain-previous CTA showed no coronary disease.  Will not pursue further cardiac evaluation.  4 anxiety-Per primary care.  Olga Millers, MD

## 2023-02-12 ENCOUNTER — Ambulatory Visit: Payer: 59 | Attending: Cardiology | Admitting: Cardiology

## 2023-02-12 ENCOUNTER — Encounter: Payer: Self-pay | Admitting: Cardiology

## 2023-02-12 VITALS — BP 118/82 | HR 87 | Ht 69.0 in | Wt 247.8 lb

## 2023-02-12 DIAGNOSIS — R002 Palpitations: Secondary | ICD-10-CM | POA: Diagnosis not present

## 2023-02-12 DIAGNOSIS — I471 Supraventricular tachycardia, unspecified: Secondary | ICD-10-CM | POA: Diagnosis not present

## 2023-02-12 DIAGNOSIS — R0789 Other chest pain: Secondary | ICD-10-CM

## 2023-02-12 NOTE — Patient Instructions (Signed)
    Follow-Up: At [facility], you and your health needs are our priority.  As part of our continuing mission to provide you with exceptional heart care, we have created designated Provider Care Teams.  These Care Teams include your primary Cardiologist (physician) and Advanced Practice Providers (APPs -  Physician Assistants and Nurse Practitioners) who all work together to provide you with the care you need, when you need it.  We recommend signing up for the patient portal called "MyChart".  Sign up information is provided on this After Visit Summary.  MyChart is used to connect with patients for Virtual Visits (Telemedicine).  Patients are able to view lab/test results, encounter notes, upcoming appointments, etc.  Non-urgent messages can be sent to your provider as well.   To learn more about what you can do with MyChart, go to [email or web address].    Your next appointment:   12 month(s)  Provider:   Brian Crenshaw, MD     

## 2023-02-20 ENCOUNTER — Telehealth: Payer: Self-pay

## 2023-02-20 NOTE — Telephone Encounter (Signed)
Due to a reschedule for tomorrow's nerve conduction study and EMG I called the patient to offer a sooner appointment. Patient has a prior appointment scheduled, but expressed appreciation for the call.

## 2023-02-23 ENCOUNTER — Encounter (INDEPENDENT_AMBULATORY_CARE_PROVIDER_SITE_OTHER): Payer: 59

## 2023-02-23 DIAGNOSIS — G4733 Obstructive sleep apnea (adult) (pediatric): Secondary | ICD-10-CM | POA: Diagnosis not present

## 2023-02-23 DIAGNOSIS — R0681 Apnea, not elsewhere classified: Secondary | ICD-10-CM

## 2023-03-14 ENCOUNTER — Ambulatory Visit: Payer: 59 | Admitting: Neurology

## 2023-03-14 ENCOUNTER — Ambulatory Visit (INDEPENDENT_AMBULATORY_CARE_PROVIDER_SITE_OTHER): Payer: 59 | Admitting: Neurology

## 2023-03-14 VITALS — BP 125/84 | HR 77 | Ht 69.0 in | Wt 240.0 lb

## 2023-03-14 DIAGNOSIS — M5386 Other specified dorsopathies, lumbar region: Secondary | ICD-10-CM

## 2023-03-14 DIAGNOSIS — R202 Paresthesia of skin: Secondary | ICD-10-CM | POA: Diagnosis not present

## 2023-03-14 DIAGNOSIS — M542 Cervicalgia: Secondary | ICD-10-CM

## 2023-03-14 DIAGNOSIS — G43009 Migraine without aura, not intractable, without status migrainosus: Secondary | ICD-10-CM

## 2023-03-14 MED ORDER — CELECOXIB 50 MG PO CAPS: 50.0000 mg | ORAL_CAPSULE | Freq: Two times a day (BID) | ORAL | 3 refills | Status: DC | PRN

## 2023-03-14 NOTE — Procedures (Signed)
Full Name: Sheri Johnson Gender: Female MRN #: [id number] Date of Birth: Oct 24, 1985    Visit Date: 03/14/2023 12:29 Age: 37 Years Examining Physician: Dr. Levert Feinstein Referring Physician: Dr. Delena Bali Height: 5 feet 9 inch History: 38 year old female presenting with intermittent bilateral arm and neck paresthesia left worse than right, chronic neck, shoulder diffuse body achy pain  Summary of the test: Nerve conduction study: Left median, ulnar sensory and motor responses were normal.  Bilateral peroneal, tibial motor responses were normal.  Bilateral sural, superficial peroneal sensory responses were normal.  Electromyography: Selected needle examination is of left upper, lower extremity muscles, lumbar and cervical paraspinal muscles were normal.  Conclusion: This is a normal study.  There is no electrodiagnostic evidence of left cervical radiculopathy, or left lumbar radiculopathy.    ------------------------------- Levert Feinstein, M.D. PhD  [facility] [address] [place], [place] [address] Tel: [phone] Fax: [phone]  Verbal informed consent was obtained from the patient, patient was informed of potential risk of procedure, including bruising, bleeding, hematoma formation, infection, muscle weakness, muscle pain, numbness, among others.        MNC    Nerve / Sites Muscle Latency Ref. Amplitude Ref. Rel Amp Segments Distance Velocity Ref. Area    ms ms mV mV %  cm m/s m/s mVms  L Median - APB     Wrist APB 3.4 ?4.4 8.7 ?4.0 100 Wrist - APB 7   33.8     Upper arm APB 7.7  5.8  66.2 Upper arm - Wrist 26 61 ?49 22.1  L Ulnar - ADM     Wrist ADM 2.6 ?3.3 9.8 ?6.0 100 Wrist - ADM 7   39.3     B.Elbow ADM 4.7  9.6  97.8 B.Elbow - Wrist 13 61 ?49 37.3     A.Elbow ADM 7.7  9.6  100 A.Elbow - B.Elbow 20 67 ?49 39.3  L Peroneal - EDB     Ankle EDB 5.5 ?6.5 3.0 ?2.0 100 Ankle - EDB 9   9.0     Fib head EDB 11.8  2.4  79.7 Fib head -  Ankle 30 48 ?44 7.3     Pop fossa EDB 14.3  2.8  115 Pop fossa - Fib head 11 45 ?44 8.6         Pop fossa - Ankle      R Peroneal - EDB     Ankle EDB 5.6 ?6.5 5.4 ?2.0 100 Ankle - EDB 9   19.2     Fib head EDB 11.6  5.1  94.9 Fib head - Ankle 31 51 ?44 18.7     Pop fossa EDB 13.9  5.2  101 Pop fossa - Fib head 11 48 ?44 18.5         Pop fossa - Ankle      L Tibial - AH     Ankle AH 4.6 ?5.8 8.7 ?4.0 100 Ankle - AH 9   26.6     Pop fossa AH 12.7  11.6  133 Pop fossa - Ankle 39 48 ?41 42.0  R Tibial - AH     Ankle AH 4.5 ?5.8 14.6 ?4.0 100 Ankle - AH 9   30.7     Pop fossa AH 13.4  13.2  90.2 Pop fossa - Ankle 42 47 ?41 33.3                 SNC  Nerve / Sites Rec. Site Peak Lat Ref.  Amp Ref. Segments Distance    ms ms V V  cm  L Sural - Ankle (Calf)     Calf Ankle 3.9 ?4.4 12 ?6 Calf - Ankle 14  R Sural - Ankle (Calf)     Calf Ankle 4.0 ?4.4 11 ?6 Calf - Ankle 14  L Superficial peroneal - Ankle     Lat leg Ankle 4.1 ?4.4 10 ?6 Lat leg - Ankle 14  R Superficial peroneal - Ankle     Lat leg Ankle 4.0 ?4.4 9 ?6 Lat leg - Ankle 14  L Median - Orthodromic (Dig II, Mid palm)     Dig II Wrist 3.2 ?3.4 21 ?10 Dig II - Wrist 13  L Ulnar - Orthodromic, (Dig V, Mid palm)     Dig V Wrist 2.8 ?3.1 13 ?5 Dig V - Wrist 4                 F  Wave    Nerve F Lat Ref.   ms ms  L Tibial - AH 49.9 ?56.0  R Tibial - AH 53.1 ?56.0  L Ulnar - ADM 26.7 ?32.0           H Reflex    Nerve H Lat Lat Hmax   ms ms   Left Right Ref. Left Right Ref.  Tibial - Soleus 39.0 38.2 ?35.0 19.9 31.4 ?35.0         EMG Summary Table    Spontaneous MUAP Recruitment  Muscle IA Fib PSW Fasc Other Amp Dur. Poly Pattern  L. First dorsal interosseous Normal None None None _______ Normal Normal Normal Normal  L. Pronator teres Normal None None None _______ Normal Normal Normal Normal  L. Biceps brachii Normal None None None _______ Normal Normal Normal Normal  L. Deltoid Normal None None None _______ Normal  Normal Normal Normal  L. Triceps brachii Normal None None None _______ Normal Normal Normal Normal  L. Extensor digitorum communis Normal None None None _______ Normal Normal Normal Normal  L. Cervical paraspinals Normal None None None _______ Normal Normal Normal Normal  L. Tibialis anterior Normal None None None _______ Normal Normal Normal Normal  L. Tibialis posterior Normal None None None _______ Normal Normal Normal Normal  L. Peroneus longus Normal None None None _______ Normal Normal Normal Normal  L. Gastrocnemius (Medial head) Normal None None None _______ Normal Normal Normal Normal  L. Vastus lateralis Normal None None None _______ Normal Normal Normal Normal  L. Lumbar paraspinals (low) Normal None None None _______ Normal Normal Normal Normal  L. Lumbar paraspinals (mid) Normal None None None _______ Normal Normal Normal Normal
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
## 2023-03-15 ENCOUNTER — Encounter: Payer: Self-pay | Admitting: Neurology

## 2023-03-30 ENCOUNTER — Telehealth: Payer: Self-pay | Admitting: Cardiology

## 2023-03-30 NOTE — Telephone Encounter (Signed)
Spoke with pt, she reports only getting symptoms when she has her monthly period. She, in the past, was taking diltiazem only as needed for palpitations. She would like to try switching back to the diltiazem to see if that would help stop this problem she is having with her periods. Will forward for dr Jens Som review

## 2023-03-30 NOTE — Telephone Encounter (Signed)
Pt c/o BP issue: STAT if pt c/o blurred vision, one-sided weakness or slurred speech  1. What are your last 5 BP readings? 90/50  2. Are you having any other symptoms (ex. Dizziness, headache, blurred vision, passed out)? Dizziness, SOB, blurred vision, weakness in legs and numbness in toes; palpitations; cut down beta blocker medication from 2 a day to 1 a day  3. What is your BP issue? Experiencing low BP

## 2023-03-30 NOTE — Telephone Encounter (Signed)
Returned call to pt. She is having low BP at night before bed and she is running around 90/55. She has symptoms when this is happening and it is happening every month at the time of her period. The symptoms do not last all day. She has started taking her medication once a day now to see if that helps. It has not really helped and would like to know what else to do and if there is correlation between the med and her period? Please advise. I did recommend she go to the ER but she is reluctant to do so.

## 2023-04-06 ENCOUNTER — Ambulatory Visit: Payer: 59 | Admitting: Physical Therapy

## 2023-04-10 NOTE — Telephone Encounter (Signed)
Change atenolol to 25 mg daily as needed for palpitations and follow Bp Olga Millers

## 2023-04-10 NOTE — Telephone Encounter (Signed)
Called left message for patient to call back --  need to give information on earlier triage call

## 2023-04-11 NOTE — Telephone Encounter (Signed)
States tried to lower atenolol as BP was low and symptoms of dizziness. But had episode of BP too high and had to call EMS so restarted back at twice day. Only thing she can contributed was that when it was low she had increased her sodium and maybe that made it go too high.    She states no issues at present feels fine, but the issues seen to be with her cycle. She states it affects her BP and BG and gastritis issues during her cycle. She does feel it is hormonal so seeing her OB/GYN today.   She will keep a log of BP and HR so we can see a trend of BP increases and times.  She will let us know if any issues prior to this.

## 2023-04-19 NOTE — Telephone Encounter (Signed)
Call to patient as this has been ongoing.  Has ordered U/S to check for endometriosis.  No hormonal panel order as feel these change too much on a day to day basis to be helpful.   She mentions her BP too high but then notes also states she stated BP too low.  OB/GYN notes are in chart.  She had stated she thinks hormonal is the cause and wanted them to work up for these issues but as mentioned, little testing ordered.  She ask regarding POTTS and advised to discuss with provider at appt.  Scheduled next week.  Advised to keep log of BP in the morning and evening, before meds and 2 hours after. This will give insight on how high or low she may be reading.  She states again not having a reliable BP cuff which then may mean these readings are not correct.  Advised to get new BP cuff asap so she can monitor until appt.

## 2023-04-19 NOTE — Telephone Encounter (Signed)
Pt c/o BP issue: STAT if pt c/o blurred vision, one-sided weakness or slurred speech  1. What are your last 5 BP readings?   Not available as patient's BP machine does not read correctly  2. Are you having any other symptoms (ex. Dizziness, headache, blurred vision, passed out)?  Dizziness, headache, hot flash, pain in chest bone area, face turning bright red, palpitation  3. What is your BP issue?   Patient stated today she had another episode of high BP reading - 160/90-something and had EMS come out.  Patient stated these symptoms happen around her menstrual time.  Patient wants advice on next steps.

## 2023-04-20 NOTE — Progress Notes (Unsigned)
HPI: FU SVT. Seen with palpitations 01/08/21; was in SVT (ECG with SVT 231 with diffuse ST depression); EMS terminated arrhythmia with adenosine. 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  Cardiac CTA September 2023 showed calcium score 0 and no coronary disease; dilated main pulmonary artery at 31 mm suggestive of pulmonary hypertension.  Echocardiogram March 2024 showed normal LV function.  ABIs with Doppler May 2024 normal.  Venous Dopplers May 2024 normal.  CTA May 2024 showed no acute chest findings including no pulmonary embolus.  Contacted the office recently with high and low blood pressures.  Added to my schedule today.  Since last seen patient has multiple complaints today.  She has been seen by EMS twice recently with complaints of difficulty swallowing followed by chest pain, heart racing and dizziness.  She feels dyspneic when she lies down and also has her heart pounding during these times.  She has not had syncope.  She is being evaluated for potential thyroid issues.  She is also scheduled to see endocrinology and is seeing gastroenterology for GI disturbances.  Current Outpatient Medications  Medication Sig Dispense Refill   albuterol (VENTOLIN HFA) 108 (90 Base) MCG/ACT inhaler Inhale 2 puffs into the lungs every 6 (six) hours as needed for wheezing or shortness of breath.     ALPRAZolam (XANAX XR) 2 MG 24 hr tablet Take 2 mg by mouth 2 (two) times daily.     Ascorbic Acid (VITAMIN C) 1000 MG tablet Take 1,000 mg by mouth daily.     atenolol (TENORMIN) 25 MG tablet Take 1 tablet (25 mg total) by mouth 2 (two) times daily. 180 tablet 3   b complex vitamins capsule Take 1 capsule by mouth daily.     celecoxib (CELEBREX) 50 MG capsule Take 1 capsule (50 mg total) by mouth 2 (two) times daily as needed for pain. 60 capsule 3   cetirizine (ZYRTEC) 10 MG tablet Take 10 mg by  mouth 2 (two) times daily.     clobetasol (TEMOVATE) 0.05 % external solution Apply 1 Application topically at bedtime. Apply to scalp     Digestive Enzymes CAPS Take 1 capsule by mouth 2 (two) times daily before a meal.     ferrous sulfate 325 (65 FE) MG EC tablet Take 1 tablet (325 mg total) by mouth daily with breakfast. 30 tablet 6   gabapentin (NEURONTIN) 300 MG capsule Take 300 mg by mouth as needed.     ibuprofen (ADVIL) 800 MG tablet Take 800 mg by mouth every 8 (eight) hours as needed for moderate pain.     levothyroxine (SYNTHROID) 100 MCG tablet Take 100 mcg by mouth every morning.     Magnesium 400 MG CAPS Take 400 mg by mouth daily as needed (Low Magnesium).     omeprazole (PRILOSEC) 40 MG capsule Take 40 mg by mouth daily.     OVER THE COUNTER MEDICATION Take 2 tablets by mouth daily. Medication: Beet Root Supplement     potassium chloride SA (KLOR-CON M) 20 MEQ tablet Take 1 tablet (20 mEq total) by mouth daily. 30 tablet 6   Probiotic Product (PROBIOTIC DAILY PO) Take 2 each by mouth at bedtime.     QVAR REDIHALER 40 MCG/ACT inhaler Inhale 2 puffs into the lungs at bedtime.     TURMERIC CURCUMIN PO Take 2 capsules by mouth daily.     ALPRAZolam (XANAX XR)  0.5 MG 24 hr tablet Take by mouth. (Patient not taking: Reported on 04/24/2023)     fluticasone (FLONASE) 50 MCG/ACT nasal spray Place 1 spray into both nostrils daily as needed for allergies or rhinitis. (Patient not taking: Reported on 02/12/2023)     fluticasone (FLOVENT HFA) 44 MCG/ACT inhaler Inhale 1 puff into the lungs 2 (two) times daily. (Patient not taking: Reported on 02/12/2023)     loperamide (IMODIUM) 2 MG capsule Take 1 capsule (2 mg total) by mouth 2 (two) times daily as needed for diarrhea or loose stools. (Patient not taking: Reported on 02/12/2023) 14 capsule 0   promethazine (PHENERGAN) 25 MG tablet Take 1 tablet (25 mg total) by mouth every 6 (six) hours as needed for nausea or vomiting. (Patient not taking: Reported  on 02/12/2023) 30 tablet 0   No current facility-administered medications for this visit.     Past Medical History:  Diagnosis Date   Abortion 11/14/2021   Anxiety    Asthma    Frequent headaches    Hypertension    Hypothyroid    SVT (supraventricular tachycardia)     Past Surgical History:  Procedure Laterality Date   CESAREAN SECTION     x2   LAPAROSCOPIC APPENDECTOMY N/A 12/14/2016   Procedure: APPENDECTOMY LAPAROSCOPIC;  Surgeon: Griselda Miner, MD;  Location: [facility] OR;  Service: General;  Laterality: N/A;   SVT ABLATION N/A 03/31/2022   Procedure: SVT ABLATION;  Surgeon: Marinus Maw, MD;  Location: [facility] INVASIVE CV LAB;  Service: Cardiovascular;  Laterality: N/A;    Social History   Socioeconomic History   Marital status: Single    Spouse name: Not on file   Number of children: 3   Years of education: Not on file   Highest education level: Associate degree: academic program  Occupational History   Not on file  Tobacco Use   Smoking status: Never   Smokeless tobacco: Never  Vaping Use   Vaping status: Never Used  Substance and Sexual Activity   Alcohol use: Not Currently    Comment: Rare   Drug use: Never   Sexual activity: Yes  Other Topics Concern   Not on file  Social History Narrative   Caffeine- none   Social Determinants of Health   Financial Resource Strain: Low Risk  (01/29/2023)   Received from [facility], [facility]   Overall Financial Resource Strain (CARDIA)    Difficulty of Paying Living Expenses: Not very hard  Food Insecurity: No Food Insecurity (01/29/2023)   Received from [facility], [facility]   Hunger Vital Sign    Worried About Running Out of Food in the Last Year: Never true    Ran Out of Food in the Last Year: Never true  Transportation Needs: No Transportation Needs (01/29/2023)   Received from [facility], [facility]   PRAPARE - Transportation    Lack of Transportation (Medical): No    Lack of Transportation  (Non-Medical): No  Physical Activity: Sufficiently Active (01/29/2023)   Received from [facility], [facility]   Exercise Vital Sign    Days of Exercise per Week: 5 days    Minutes of Exercise per Session: 60 min  Stress: Stress Concern Present (01/29/2023)   Received from [facility], [facility] of Occupational [facility] - Occupational Stress Questionnaire    Feeling of Stress : Very much  Social Connections: Moderately Integrated (01/29/2023)   Received from [facility], [facility]   Social  Network    How would you rate your social network (family, work, friends)?: Adequate participation with social networks  Intimate Partner Violence: Not At Risk (01/29/2023)   Received from [facility], [facility]   HITS    Over the last 12 months how often did your partner physically hurt you?: 1    Over the last 12 months how often did your partner insult you or talk down to you?: 1    Over the last 12 months how often did your partner threaten you with physical harm?: 1    Over the last 12 months how often did your partner scream or curse at you?: 1    Family History  Problem Relation Age of Onset   Hypertension Mother    Hyperthyroidism Mother     ROS: no fevers or chills, productive cough, hemoptysis, dysphasia, odynophagia, melena, hematochezia, dysuria, hematuria, rash, seizure activity, orthopnea, PND, pedal edema, claudication. Remaining systems are negative.  Physical Exam: Well-developed well-nourished in no acute distress.  Skin is warm and dry.  HEENT is normal.  Neck is supple.  Chest is clear to auscultation with normal expansion.  Cardiovascular exam is regular rate and rhythm.  Abdominal exam nontender or distended. No masses palpated. Extremities show no edema. neuro grossly intact  EKG Interpretation Date/Time:  Tuesday April 24 2023 10:52:56 EDT Ventricular Rate:  78 PR Interval:  168 QRS Duration:  86 QT Interval:  420 QTC  Calculation: 478 R Axis:   31  Text Interpretation: Normal sinus rhythm Nonspecific T wave abnormality When compared with ECG of 03-Jan-2023 19:26, PREVIOUS ECG IS PRESENT Confirmed by Olga Millers ([id number]) on 04/24/2023 11:05:26 AM    A/P  1 supraventricular tachycardia-history of AV node reentrant tachycardia ablation.  Will continue beta-blocker as she has had problems with palpitations in the past.  2 hypertension-patient states that she has had blood pressure spikes but these occurred in the setting of pain with swallowing and EMS coming to her home with likely anxiety component as well.  Her blood pressure otherwise has been well-controlled and is normal today.  Will continue to follow.  3 history of chest pain-patient continues to have occasional chest pain.  Previous CTA showed no coronary disease.  Electrocardiogram shows no ST changes.  Will not pursue further ischemia evaluation.  4 palpitations-patient continues to have episodes of palpitations.  We discussed a smart watch to record her rhythm at time of symptoms.  We will treat accordingly.  She is concerned that her potassium is low and that she may be anemic contributing to her palpitations.  Will check hemoglobin and potassium.  5 anxiety-managed by primary care.  Olga Millers, MD

## 2023-04-24 ENCOUNTER — Encounter: Payer: Self-pay | Admitting: Cardiology

## 2023-04-24 ENCOUNTER — Ambulatory Visit: Payer: 59 | Attending: Cardiology | Admitting: Cardiology

## 2023-04-24 VITALS — BP 124/86 | HR 78 | Ht 69.0 in | Wt 242.8 lb

## 2023-04-24 DIAGNOSIS — R0789 Other chest pain: Secondary | ICD-10-CM | POA: Diagnosis not present

## 2023-04-24 DIAGNOSIS — I471 Supraventricular tachycardia, unspecified: Secondary | ICD-10-CM

## 2023-04-24 DIAGNOSIS — R002 Palpitations: Secondary | ICD-10-CM | POA: Diagnosis not present

## 2023-04-24 DIAGNOSIS — R079 Chest pain, unspecified: Secondary | ICD-10-CM

## 2023-04-24 NOTE — Patient Instructions (Signed)
Follow-Up: At [facility], you and your health needs are our priority.  As part of our continuing mission to provide you with exceptional heart care, we have created designated Provider Care Teams.  These Care Teams include your primary Cardiologist (physician) and Advanced Practice Providers (APPs -  Physician Assistants and Nurse Practitioners) who all work together to provide you with the care you need, when you need it.  We recommend signing up for the patient portal called "MyChart".  Sign up information is provided on this After Visit Summary.  MyChart is used to connect with patients for Virtual Visits (Telemedicine).  Patients are able to view lab/test results, encounter notes, upcoming appointments, etc.  Non-urgent messages can be sent to your provider as well.   To learn more about what you can do with MyChart, go to [email or web address].    Your next appointment:   6 month(s)  Provider:   Olga Millers, MD

## 2023-04-27 ENCOUNTER — Encounter: Payer: Self-pay | Admitting: Physical Therapy

## 2023-04-27 ENCOUNTER — Other Ambulatory Visit: Payer: Self-pay

## 2023-04-27 ENCOUNTER — Ambulatory Visit: Payer: 59 | Attending: Neurology | Admitting: Physical Therapy

## 2023-04-27 VITALS — BP 120/72 | HR 82

## 2023-04-27 DIAGNOSIS — M5481 Occipital neuralgia: Secondary | ICD-10-CM | POA: Diagnosis present

## 2023-04-27 DIAGNOSIS — M792 Neuralgia and neuritis, unspecified: Secondary | ICD-10-CM

## 2023-04-27 DIAGNOSIS — M79602 Pain in left arm: Secondary | ICD-10-CM

## 2023-04-27 DIAGNOSIS — M5414 Radiculopathy, thoracic region: Secondary | ICD-10-CM | POA: Diagnosis present

## 2023-04-27 DIAGNOSIS — R252 Cramp and spasm: Secondary | ICD-10-CM | POA: Diagnosis present

## 2023-04-27 DIAGNOSIS — R202 Paresthesia of skin: Secondary | ICD-10-CM | POA: Insufficient documentation

## 2023-04-27 DIAGNOSIS — M5386 Other specified dorsopathies, lumbar region: Secondary | ICD-10-CM | POA: Diagnosis not present

## 2023-04-27 DIAGNOSIS — M6281 Muscle weakness (generalized): Secondary | ICD-10-CM | POA: Diagnosis present

## 2023-04-27 DIAGNOSIS — M542 Cervicalgia: Secondary | ICD-10-CM

## 2023-04-27 NOTE — Therapy (Unsigned)
OUTPATIENT PHYSICAL THERAPY CERVICAL EVALUATION   Patient Name: Sheri Johnson MRN: [id number] DOB:12-Nov-1985, 37 y.o., female Today's Date: 04/27/2023  END OF SESSION:  PT End of Session - 04/27/23 1331     Visit Number 1    Number of Visits 9   8 + eval   Authorization Type [employer]    PT Start Time 1316    PT Stop Time 1405    PT Time Calculation (min) 49 min    Behavior During Therapy [facility] for tasks assessed/performed             Past Medical History:  Diagnosis Date   Abortion 11/14/2021   Anxiety    Asthma    Frequent headaches    Hypertension    Hypothyroid    SVT (supraventricular tachycardia)    Past Surgical History:  Procedure Laterality Date   CESAREAN SECTION     x2   LAPAROSCOPIC APPENDECTOMY N/A 12/14/2016   Procedure: APPENDECTOMY LAPAROSCOPIC;  Surgeon: Griselda Miner, MD;  Location: [facility] OR;  Service: General;  Laterality: N/A;   SVT ABLATION N/A 03/31/2022   Procedure: SVT ABLATION;  Surgeon: Marinus Maw, MD;  Location: [facility] INVASIVE CV LAB;  Service: Cardiovascular;  Laterality: N/A;   Patient Active Problem List   Diagnosis Date Noted   Neck pain 03/14/2023   Paresthesia 03/14/2023   Sciatica associated with disorder of lumbar spine 03/14/2023   Migraine without aura and without status migrainosus, not intractable 03/14/2023   Witnessed episode of apnea 01/16/2023   Hypertension 12/14/2021   Palpitations 12/14/2021   Chest pain 12/14/2021   SVT (supraventricular tachycardia) 12/13/2021   Acute appendicitis 12/14/2016   Appendicitis 12/14/2016    PCP: Porfirio Oar, PA  REFERRING PROVIDER: Levert Feinstein, MD  REFERRING DIAG: [phone] (ICD-10-CM) - Sciatica associated with disorder of lumbar spine M54.2 (ICD-10-CM) - Neck pain R20.2 (ICD-10-CM) - Paresthesia  THERAPY DIAG:  Cervicalgia  Pain in left arm  Bilateral occipital neuralgia  Neuralgia and neuritis  Muscle weakness (generalized)  Cramp and spasm  Radiculopathy, thoracic  region  Rationale for Evaluation and Treatment: Rehabilitation  ONSET DATE: 2.5 years  SUBJECTIVE:                                                                                                                                                                                                         SUBJECTIVE STATEMENT: "I have a lot of left sided pain.  The numbness and tingling in my legs is now on both sides.  The pain coming from my neck stops  around the shoulder, but the feelings I have in my arms is N/T from the elbow down the forearm to the hands." Hand dominance: Right  PERTINENT HISTORY:  Hx of SVT and chest pain, sciatica, migraines  PAIN:  Are you having pain? Yes: NPRS scale: 8/10 Pain location: lower neck (pt gestures to C7 area) down to bilateral shoulders Pain description: throbbing, burning Aggravating factors: sitting for long periods of time, certain sleeping postures - side-lying mainly, bad GI and allergy day Relieving factors: moving around  PRECAUTIONS: None  RED FLAGS: Bowel or bladder incontinence: Yes: recent urge incontinence - GI aware (internal ultrasound scheduled for next month)     WEIGHT BEARING RESTRICTIONS: No  FALLS:  Has patient fallen in last 6 months? No  LIVING ENVIRONMENT: Lives with: lives with their family Lives in: House/apartment Stairs: Yes: Internal: 15-16 steps; can reach both Has following equipment at home: None  OCCUPATION: accounting - desk work (recently Scientist, product/process development)  PLOF: Independent  PATIENT GOALS: "Just to feel better, I want more permanent relief."  "A big goal of mine is to get off the prescription pain and anxiety meds."  NEXT MD VISIT: Bilateral C4/C5 and C5/C6 radiofrequency ablation scheduled 9/26 w/ Wynonia Lawman R  OBJECTIVE:   DIAGNOSTIC FINDINGS:  NCV w/ EMG WNL on 03/14/2023  PATIENT SURVEYS:  NDI 25/50 = moderate disability rating LEFS 28/80 = high impaired LE function; pt notes many  tasks as rated on form are difficult due to UE NOT LE pain  COGNITION: Overall cognitive status: Within functional limits for tasks assessed  SENSATION: Light touch: WFL and pt reports less sensation on left  POSTURE: rounded shoulders and forward head  PALPATION: Patient is tender over C7 and cervicothoracic junction.   CERVICAL ROM:   Active ROM A/PROM (deg) eval  Flexion WNL; painful  Extension WNL  Right lateral flexion "  Left lateral flexion "  Right rotation "  Left rotation "   (Blank rows = not tested)  UPPER EXTREMITY ROM:  Active ROM Right eval Left eval  Shoulder flexion [facility]  Shoulder extension   Shoulder abduction   Shoulder adduction   Shoulder extension   Shoulder internal rotation   Shoulder external rotation   Elbow flexion   Elbow extension   Wrist flexion   Wrist extension   Wrist ulnar deviation    Wrist radial deviation    Wrist pronation    Wrist supination     (Blank rows = not tested)  UPPER EXTREMITY MMT:  MMT Right eval Left eval  Shoulder flexion 4/5 4-/5  Shoulder extension    Shoulder abduction 4+/5 4+/5  Shoulder adduction    Shoulder extension    Shoulder internal rotation    Shoulder external rotation    Middle trapezius    Lower trapezius    Elbow flexion 5/5 4+/5  Elbow extension    Wrist flexion    Wrist extension    Wrist ulnar deviation    Wrist radial deviation    Wrist pronation    Wrist supination    Grip strength *** ***   (Blank rows = not tested)  CERVICAL SPECIAL TESTS:  Upper limb tension test (ULTT): To be assessed (mainly LUE) and Distraction test: Positive - pt uses a traction unit at home.  FUNCTIONAL TESTS:  5 times sit to stand: 14.47 seconds no UE support  TODAY'S TREATMENT:  DATE: N/A - eval only.   PATIENT EDUCATION:  Education details: PT POC, assessments used  and to be used, and goals to be set. Person educated: Patient Education method: Explanation Education comprehension: verbalized understanding and needs further education  HOME EXERCISE PROGRAM: To be established.  ASSESSMENT:  CLINICAL IMPRESSION: Patient is a 37 y.o. female who was seen today for physical therapy evaluation and treatment for cervicalgia w/ possible radicular component into LUE and BLE N/T.  Pt has a significant PMH of Hx of SVT and chest pain, sciatica, migraines.  Identified impairments include significant neck pain, rounded shoulders and forward head posture, moderate tenderness in lower cervical spine, mild bilateral proximal shoulder weakness, .  Evaluation via the following assessment tools: *** indicate fall risk.  They would benefit from skilled PT to address impairments as noted and progress towards long term goals.  OBJECTIVE IMPAIRMENTS: {opptimpairments:25111}.   ACTIVITY LIMITATIONS: {activitylimitations:27494}  PARTICIPATION LIMITATIONS: {participationrestrictions:25113}  PERSONAL FACTORS: {Personal factors:25162} are also affecting patient's functional outcome.   REHAB POTENTIAL: {rehabpotential:25112}  CLINICAL DECISION MAKING: Evolving/moderate complexity  EVALUATION COMPLEXITY: Moderate   GOALS: Goals reviewed with patient? Yes  SHORT TERM GOALS: Target date: ***  *** Baseline:  Goal status: INITIAL  2.  *** Baseline:  Goal status: INITIAL  3.  *** Baseline:  Goal status: INITIAL  4.  *** Baseline:  Goal status: INITIAL  5.  *** Baseline:  Goal status: INITIAL  6.  *** Baseline:  Goal status: INITIAL  LONG TERM GOALS: Target date: ***  *** Baseline:  Goal status: INITIAL  2.  *** Baseline:  Goal status: INITIAL  3.  *** Baseline:  Goal status: INITIAL  4.  *** Baseline:  Goal status: INITIAL  5.  *** Baseline:  Goal status: INITIAL  6.  *** Baseline:  Goal status: INITIAL   PLAN:  PT FREQUENCY:  1x/week  PT DURATION: 8 weeks  PLANNED INTERVENTIONS: {rehab planned interventions:25118::"Therapeutic exercises","Therapeutic activity","Neuromuscular re-education","Balance training","Gait training","Patient/Family education","Self Care","Joint mobilization"}  PLAN FOR NEXT SESSION: ***ASSESS bilateral grip strength and LUE ULTT - set goals as needed.    Sadie Haber, PT, DPT 04/27/2023, 2:09 PM

## 2023-05-03 ENCOUNTER — Encounter: Payer: Self-pay | Admitting: Cardiology

## 2023-05-03 ENCOUNTER — Telehealth: Payer: Self-pay | Admitting: Physician Assistant

## 2023-05-03 NOTE — Telephone Encounter (Signed)
   The patient called the answering service after-hours today. Chart outlines history of SVT, inappropriate sinus tach, no CAD by cor CT 04/2022. 2 day monitor April 2023 showed sinus rhythm with rare PACs and PVCs.  She recently saw Dr. Jens Som on 04/24/23 and reported intermittent palpitations at that time. He encouraged use of Apple watch. She had spinal surgery today. Postoperatively she has noticed palpitations. They feel slightly different than what she had before. Usually she gets lightheaded/dizzy with them. With these she feels an intermittent chest pain or tightness sensation. She sent in Apple Watch EKG tracing after hours this evening to Dr. Jens Som so he had not yet had a chance to review this. She had contacted a family member who is a nurse who told her that the abnormal beats look like a bundle branch block. She became concerned so called our office for review. Upon my review of the ECG tracings, this looks to be NSR with PVCs. It's not a 12 lead though so we are limited by one lead. Her native QRS is narrow so I told her I do not think this is a bundle branch block.  We discussed that new chest pain or tightness would warrant ER evaluation. She does not wish to go. She wanted to know if these were dangerous to her or what further workup needs to be done. I told her I could not speak to her whole case and would defer to Dr. Jens Som on additional workup - will route to him to advise patient further. We did review that these were seen on prior monitor and that previous cardiac testing thankfully very reassuring.   The patient verbalized understanding and gratitude.  Laurann Montana, PA-C

## 2023-05-07 ENCOUNTER — Ambulatory Visit: Payer: 59 | Admitting: Physical Therapy

## 2023-05-14 ENCOUNTER — Ambulatory Visit: Payer: 59 | Admitting: Physical Therapy

## 2023-05-16 ENCOUNTER — Telehealth: Payer: Self-pay | Admitting: Cardiology

## 2023-05-16 NOTE — Telephone Encounter (Signed)
  Pt is requesting to speak with Stanton Kidney, she said, she would like to continue their discussion from [employer]

## 2023-05-16 NOTE — Telephone Encounter (Signed)
Patient states she was "trying to wait until things calmed down"  She states issues with BP and HR.  She states HR over 100 and O2 drops to 94%, when she stands up. She discusses adjusting medications.  She wants to know if she should adjust beta blocker through her recovery. 3:39 BP 151/88 155-160/ 90's both times called EMS out. States this morning was normal 117/71 She states since nerve ablation nothing is going right" " No SVT's and doesn't need converting but can't stand upp or move.  Sitting down my HR is 101". States going to [place] for a wedding tomorrow and wants to resolve before she goes. She states there was mention of increasing her medication but she didn't want to do that but now needs to know what to do since she is not feeling better and that her HR and O2 are fluctuating.    Offered multiple appointments and she has a conflict with each one.  Advised that will send to scheduling to get an appt to be seen for evaluation.  Advised would send to provider to see if any recommendations prior to an appointments

## 2023-05-24 ENCOUNTER — Encounter: Payer: Self-pay | Admitting: Physical Therapy

## 2023-05-24 ENCOUNTER — Ambulatory Visit: Payer: 59 | Attending: Physician Assistant | Admitting: Physical Therapy

## 2023-05-24 DIAGNOSIS — M5414 Radiculopathy, thoracic region: Secondary | ICD-10-CM | POA: Insufficient documentation

## 2023-05-24 DIAGNOSIS — M542 Cervicalgia: Secondary | ICD-10-CM | POA: Diagnosis present

## 2023-05-24 DIAGNOSIS — M79602 Pain in left arm: Secondary | ICD-10-CM | POA: Diagnosis present

## 2023-05-24 DIAGNOSIS — M5481 Occipital neuralgia: Secondary | ICD-10-CM | POA: Diagnosis present

## 2023-05-24 DIAGNOSIS — R252 Cramp and spasm: Secondary | ICD-10-CM | POA: Diagnosis present

## 2023-05-24 DIAGNOSIS — M792 Neuralgia and neuritis, unspecified: Secondary | ICD-10-CM | POA: Diagnosis present

## 2023-05-24 DIAGNOSIS — M6281 Muscle weakness (generalized): Secondary | ICD-10-CM | POA: Diagnosis present

## 2023-05-24 NOTE — Therapy (Signed)
OUTPATIENT PHYSICAL THERAPY CERVICAL TREATMENT   Patient Name: Sheri Johnson MRN: [id number] DOB:[birth year], 37 y.o., female Today's Date: 05/24/2023  END OF SESSION:  PT End of Session - 05/24/23 1501     Visit Number 2    Number of Visits 9   8 + eval   Date for PT Re-Evaluation 07/06/23   pushed out due to scheduling conflicts for patient.   Authorization Type [employer]    PT Start Time 1452   pt arrived late   PT Stop Time 1533    PT Time Calculation (min) 41 min    Behavior During Therapy [facility] for tasks assessed/performed               Past Medical History:  Diagnosis Date   Abortion 11/14/2021   Anxiety    Asthma    Frequent headaches    Hypertension    Hypothyroid    SVT (supraventricular tachycardia) (HCC)    Past Surgical History:  Procedure Laterality Date   CESAREAN SECTION     x2   LAPAROSCOPIC APPENDECTOMY N/A 12/14/2016   Procedure: APPENDECTOMY LAPAROSCOPIC;  Surgeon: Griselda Miner, MD;  Location: [facility] OR;  Service: General;  Laterality: N/A;   SVT ABLATION N/A 03/31/2022   Procedure: SVT ABLATION;  Surgeon: Marinus Maw, MD;  Location: [facility] INVASIVE CV LAB;  Service: Cardiovascular;  Laterality: N/A;   Patient Active Problem List   Diagnosis Date Noted   Neck pain 03/14/2023   Paresthesia 03/14/2023   Sciatica associated with disorder of lumbar spine 03/14/2023   Migraine without aura and without status migrainosus, not intractable 03/14/2023   Witnessed episode of apnea 01/16/2023   Hypertension 12/14/2021   Palpitations 12/14/2021   Chest pain 12/14/2021   SVT (supraventricular tachycardia) (HCC) 12/13/2021   Acute appendicitis 12/14/2016   Appendicitis 12/14/2016    PCP: Porfirio Oar, PA  REFERRING PROVIDER: Levert Feinstein, MD  REFERRING DIAG: [phone] (ICD-10-CM) - Sciatica associated with disorder of lumbar spine M54.2 (ICD-10-CM) - Neck pain R20.2 (ICD-10-CM) - Paresthesia  THERAPY DIAG:  Pain in left arm  Bilateral occipital  neuralgia  Neuralgia and neuritis  Muscle weakness (generalized)  Cramp and spasm  Cervicalgia  Radiculopathy, thoracic region  Rationale for Evaluation and Treatment: Rehabilitation  ONSET DATE: 2.5 years  SUBJECTIVE:                                                                                                                                                                                                         SUBJECTIVE STATEMENT: Patient reports she had  her radiofrequency ablation procedure and this has intensified her pain in the same region as before.  This is why she missed her initial 2 therapy appts.  She states they are going to schedule her another MRI, but she is allowed to continue therapy.  She reports having done standing yoga twice and this helped so she is helpful mobility will help.  Pt reports using her manual traction unit with some relief. Hand dominance: Right  PERTINENT HISTORY:  Hx of SVT and chest pain, sciatica, migraines  PAIN:  Are you having pain? Yes: NPRS scale: 9/10 Pain location: lower neck (pt gestures to C7 area) down to bilateral shoulders Pain description: throbbing, burning Aggravating factors: sitting for long periods of time, certain sleeping postures - side-lying mainly, bad GI and allergy day Relieving factors: moving around  PRECAUTIONS: None  RED FLAGS: Bowel or bladder incontinence: Yes: recent urge incontinence - GI aware (internal ultrasound scheduled for next month)     WEIGHT BEARING RESTRICTIONS: No  FALLS:  Has patient fallen in last 6 months? No  LIVING ENVIRONMENT: Lives with: lives with their family Lives in: House/apartment Stairs: Yes: Internal: 15-16 steps; can reach both Has following equipment at home: None  OCCUPATION: accounting - desk work (recently Scientist, product/process development)  PLOF: Independent  PATIENT GOALS: "Just to feel better, I want more permanent relief."  "A big goal of mine is to get off the  prescription pain and anxiety meds."  NEXT MD VISIT: Bilateral C4/C5 and C5/C6 radiofrequency ablation scheduled 9/26 w/ Wynonia Lawman R  OBJECTIVE:   DIAGNOSTIC FINDINGS:  NCV w/ EMG WNL on 03/14/2023  PATIENT SURVEYS:  NDI 25/50 = moderate disability rating LEFS 28/80 = high impaired LE function; pt notes many tasks as rated on form are difficult due to UE NOT LE pain  COGNITION: Overall cognitive status: Within functional limits for tasks assessed  SENSATION: Light touch: WFL and pt reports less sensation on left  POSTURE: rounded shoulders and forward head  PALPATION: Patient is tender over C7 and cervicothoracic junction.   CERVICAL ROM:   Active ROM A/PROM (deg) eval  Flexion WNL; painful  Extension WNL  Right lateral flexion "  Left lateral flexion "  Right rotation "  Left rotation "   (Blank rows = not tested)  UPPER EXTREMITY ROM:  Active ROM Right eval Left eval  Shoulder flexion [facility]  Shoulder extension   Shoulder abduction   Shoulder adduction   Shoulder extension   Shoulder internal rotation   Shoulder external rotation   Elbow flexion   Elbow extension   Wrist flexion   Wrist extension   Wrist ulnar deviation    Wrist radial deviation    Wrist pronation    Wrist supination     (Blank rows = not tested)  UPPER EXTREMITY MMT:  MMT Right eval Left eval  Shoulder flexion 4/5 4-/5  Shoulder extension    Shoulder abduction 4+/5 4+/5  Shoulder adduction    Shoulder extension    Shoulder internal rotation    Shoulder external rotation    Middle trapezius    Lower trapezius    Elbow flexion 5/5 4+/5  Elbow extension    Wrist flexion    Wrist extension    Wrist ulnar deviation    Wrist radial deviation    Wrist pronation    Wrist supination    Grip strength TBD TBD   (Blank rows = not tested)  CERVICAL SPECIAL TESTS:  Upper limb tension  test (ULTT): To be assessed (mainly LUE) and Distraction test: Positive - pt uses a traction  unit at home.  FUNCTIONAL TESTS:  5 times sit to stand: 14.47 seconds no UE support  TODAY'S TREATMENT:                                                                                                                              DATE: 05/24/2023 R (dominant) grip: 30.2, 35.2, 47.1 = 37.5 lbs L grip: 31.9, 28.2, 29.3 = 29.8 lbs  LUE ULTT:  Median 1, ulnar, and radial tests negative, some question of tingling in left shoulder during median 1, not determinate of peripheral nerve involvement.  Initiated HEP to manage grip and functional weakness (trialed yellow putty): Access Code: [email or web address] URL: [email or web address] Date: 05/24/2023 Prepared by: Camille Bal  Exercises - Tip Pinch with Putty  - 1 x daily - 7 x weekly - 1 sets - 10 reps - Key Pinch with Putty  - 1 x daily - 7 x weekly - 1 sets - 10 reps - 3-Point Pinch with Putty  - 1 x daily - 7 x weekly - 1 sets - 10 reps - Seated Finger Extension with Putty  - 1 x daily - 7 x weekly - 1 sets - 10 reps - Seated Finger Composite Flexion with Putty  - 1 x daily - 7 x weekly - 1 sets - 10 reps - Sit to Stand with Arms Crossed  - 1 x daily - 7 x weekly - 2 sets - 10 reps  PATIENT EDUCATION:  Education details: Discussion of muscular chest pain vs s/s of heart attack, anxiety role in symptom manifestation, ongoing cardiologist follow-up to maintain heart health at baseline.  Initial HEP and goal of progressing to cervical manual work and mobility next visit.  Encouraged use of walking pad once to tolerance this week to supplement other general movement. Person educated: Patient Education method: Explanation Education comprehension: verbalized understanding and needs further education  HOME EXERCISE PROGRAM: Access Code: [email or web address] URL: [email or web address] Date: 05/24/2023 Prepared by: Camille Bal  Exercises - Tip Pinch with Putty  - 1 x daily - 7 x weekly - 1 sets - 10 reps - Key Pinch with  Putty  - 1 x daily - 7 x weekly - 1 sets - 10 reps - 3-Point Pinch with Putty  - 1 x daily - 7 x weekly - 1 sets - 10 reps - Seated Finger Extension with Putty  - 1 x daily - 7 x weekly - 1 sets - 10 reps - Seated Finger Composite Flexion with Putty  - 1 x daily - 7 x weekly - 1 sets - 10 reps - Sit to Stand with Arms Crossed  - 1 x daily - 7 x weekly - 2 sets - 10 reps  ASSESSMENT:  CLINICAL IMPRESSION: Completed assessments this visit with grip weakness noted only mildly outside ratio of non-dominant to dominant side.  Pt  has experienced increased symptoms since nerve ablation procedure, but has follow-ups in place.  Established introductory HEP to address weakness identified today and functionally without exacerbating cervical pain in post-ablation setting.  Will further address cervical mobility in coming visit.  OBJECTIVE IMPAIRMENTS: decreased activity tolerance, decreased knowledge of condition, decreased strength, impaired flexibility, improper body mechanics, postural dysfunction, and pain.   ACTIVITY LIMITATIONS: carrying, lifting, bending, squatting, reach over head, and locomotion level  PARTICIPATION LIMITATIONS: meal prep, cleaning, laundry, and occupation  PERSONAL FACTORS: Fitness, Past/current experiences, Sex, Time since onset of injury/illness/exacerbation, and 1-2 comorbidities: migraines  are also affecting patient's functional outcome.   REHAB POTENTIAL: Good  CLINICAL DECISION MAKING: Evolving/moderate complexity  EVALUATION COMPLEXITY: Moderate   GOALS: Goals reviewed with patient? Yes  SHORT TERM GOALS: Target date: 05/25/2023  Pt will be independent and compliant with initial mobility and pain management focused HEP in order to maintain functional progress and improve mobility. Baseline: To be established. Goal status: INITIAL  2.  Pt will decrease 5xSTS to </=12 seconds w/o UE use in order to demonstrate decreased risk for falls and improved functional  bilateral LE strength and power. Baseline: 14.47 seconds no UE support Goal status: INITIAL  LONG TERM GOALS: Target date: 06/22/2023  Pt will be independent and compliant with finalized mobility and pain management focused HEP in order to maintain functional progress and improve mobility. Baseline: To be established. Goal status: INITIAL  2.  Grip strength to be assessed with goal set as appropriate. Baseline:  No significant difference, subjective feeling of weakness, HEP to address. Goal status: REVISED - D/C'd 10/17  3.  ULTT to be assessed w/ goal set as appropriate. Baseline:  Not needed. Goal status: REVISED - D/C'd 10/17  4.  Patient will adhere to walking program >/=3 days a week in order to improve activity tolerance and global mobility. Baseline:  To be established. Goal status: INITIAL  5.  Patient will improve NDI score to </=20/50 in order to demonstrate improved neck pain and improved quality of life. Baseline: 25/50 Goal status: INITIAL  6.  Patient will improve LEFS score to >/=37/80 in order to demonstrate improved LE function and mobility. Baseline: 28/80 Goal status: INITIAL  PLAN:  PT FREQUENCY: 1x/week  PT DURATION: 8 weeks  PLANNED INTERVENTIONS: Therapeutic exercises, Therapeutic activity, Neuromuscular re-education, Balance training, Gait training, Patient/Family education, Self Care, Joint mobilization, Stair training, Vestibular training, Dry Needling, Electrical stimulation, Spinal mobilization, Moist heat, Taping, Traction, Manual therapy, and Re-evaluation  PLAN FOR NEXT SESSION: Add to functional LE strength, grip strength, and cervical mobility focused HEP.  Walking program.  STM.  Sadie Haber, PT, DPT 05/24/2023, 3:34 PM

## 2023-05-24 NOTE — Patient Instructions (Signed)
Access Code: [email or web address] URL: [email or web address] Date: 05/24/2023 Prepared by: Camille Bal  Exercises - Tip Pinch with Putty  - 1 x daily - 7 x weekly - 1 sets - 10 reps - Key Pinch with Putty  - 1 x daily - 7 x weekly - 1 sets - 10 reps - 3-Point Pinch with Putty  - 1 x daily - 7 x weekly - 1 sets - 10 reps - Seated Finger Extension with Putty  - 1 x daily - 7 x weekly - 1 sets - 10 reps - Seated Finger Composite Flexion with Putty  - 1 x daily - 7 x weekly - 1 sets - 10 reps - Sit to Stand with Arms Crossed  - 1 x daily - 7 x weekly - 2 sets - 10 reps

## 2023-05-24 NOTE — Telephone Encounter (Signed)
Left message for pt to call.

## 2023-05-28 ENCOUNTER — Ambulatory Visit: Payer: 59 | Admitting: Physical Therapy

## 2023-05-28 ENCOUNTER — Encounter: Payer: Self-pay | Admitting: Physical Therapy

## 2023-05-28 DIAGNOSIS — M79602 Pain in left arm: Secondary | ICD-10-CM | POA: Diagnosis not present

## 2023-05-28 DIAGNOSIS — R252 Cramp and spasm: Secondary | ICD-10-CM

## 2023-05-28 DIAGNOSIS — M6281 Muscle weakness (generalized): Secondary | ICD-10-CM

## 2023-05-28 DIAGNOSIS — M792 Neuralgia and neuritis, unspecified: Secondary | ICD-10-CM

## 2023-05-28 DIAGNOSIS — M542 Cervicalgia: Secondary | ICD-10-CM

## 2023-05-28 DIAGNOSIS — M5481 Occipital neuralgia: Secondary | ICD-10-CM

## 2023-05-28 DIAGNOSIS — M5414 Radiculopathy, thoracic region: Secondary | ICD-10-CM

## 2023-05-28 NOTE — Patient Instructions (Signed)
-   Supine Scapular Protraction in Flexion with Dumbbells  - 1 x daily - 4-5 x weekly - 2-3 sets - 10 reps - Supine Cervical Retraction with Towel  - 1 x daily - 7 x weekly - 1-2 sets - 10 reps - 2 seconds hold - Supine Chin Tuck  - 1 x daily - 7 x weekly - 2 sets - 10 reps - Open Book Chest Stretch on Towel Roll  - 1 x daily - 7 x weekly - 1 sets - 2 reps - 1 minutes hold

## 2023-05-28 NOTE — Therapy (Unsigned)
OUTPATIENT PHYSICAL THERAPY CERVICAL TREATMENT   Patient Name: Sheri Johnson MRN: [id number] DOB:07/26/1986, 37 y.o., female Today's Date: 05/28/2023  END OF SESSION:  PT End of Session - 05/28/23 1320     Visit Number 3    Number of Visits 9   8 + eval   Date for PT Re-Evaluation 07/06/23   pushed out due to scheduling conflicts for patient.   Authorization Type [employer]    PT Start Time 1317    PT Stop Time 1400    PT Time Calculation (min) 43 min    Activity Tolerance Patient limited by pain    Behavior During Therapy [facility] for tasks assessed/performed               Past Medical History:  Diagnosis Date   Abortion 11/14/2021   Anxiety    Asthma    Frequent headaches    Hypertension    Hypothyroid    SVT (supraventricular tachycardia) (HCC)    Past Surgical History:  Procedure Laterality Date   CESAREAN SECTION     x2   LAPAROSCOPIC APPENDECTOMY N/A 12/14/2016   Procedure: APPENDECTOMY LAPAROSCOPIC;  Surgeon: Griselda Miner, MD;  Location: [facility] OR;  Service: General;  Laterality: N/A;   SVT ABLATION N/A 03/31/2022   Procedure: SVT ABLATION;  Surgeon: Marinus Maw, MD;  Location: [facility] INVASIVE CV LAB;  Service: Cardiovascular;  Laterality: N/A;   Patient Active Problem List   Diagnosis Date Noted   Neck pain 03/14/2023   Paresthesia 03/14/2023   Sciatica associated with disorder of lumbar spine 03/14/2023   Migraine without aura and without status migrainosus, not intractable 03/14/2023   Witnessed episode of apnea 01/16/2023   Hypertension 12/14/2021   Palpitations 12/14/2021   Chest pain 12/14/2021   SVT (supraventricular tachycardia) (HCC) 12/13/2021   Acute appendicitis 12/14/2016   Appendicitis 12/14/2016    PCP: Porfirio Oar, PA  REFERRING PROVIDER: Levert Feinstein, MD  REFERRING DIAG: [phone] (ICD-10-CM) - Sciatica associated with disorder of lumbar spine M54.2 (ICD-10-CM) - Neck pain R20.2 (ICD-10-CM) - Paresthesia  THERAPY DIAG:  Pain in left  arm  Bilateral occipital neuralgia  Neuralgia and neuritis  Muscle weakness (generalized)  Cramp and spasm  Cervicalgia  Radiculopathy, thoracic region  Rationale for Evaluation and Treatment: Rehabilitation  ONSET DATE: 2.5 years  SUBJECTIVE:                                                                                                                                                                                                         SUBJECTIVE  STATEMENT: Patient reports she is having some foot discomfort today due to flats she is wearing.  She states her neck is mildly better than last time.  She is not doing the STS due to it making her nauseous.  She has been taking short walks.  She states ongoing trouble with left grip, but her grip HEP is going fine.  She is still having "zaps" of pain in shoulders and difficulty holding head up for prolonged periods of time since procedure.  She is attending yoga twice this week. Hand dominance: Right  PERTINENT HISTORY:  Hx of SVT and chest pain, sciatica, migraines  PAIN:  Are you having pain? Yes: NPRS scale: 7/10 Pain location: lower neck (pt gestures to C7 area) down to bilateral shoulders Pain description: throbbing, burning Aggravating factors: sitting for long periods of time, certain sleeping postures - side-lying mainly, bad GI and allergy day Relieving factors: moving around  PRECAUTIONS: None  RED FLAGS: Bowel or bladder incontinence: Yes: recent urge incontinence - GI aware (internal ultrasound scheduled for next month)     WEIGHT BEARING RESTRICTIONS: No  FALLS:  Has patient fallen in last 6 months? No  LIVING ENVIRONMENT: Lives with: lives with their family Lives in: House/apartment Stairs: Yes: Internal: 15-16 steps; can reach both Has following equipment at home: None  OCCUPATION: accounting - desk work (recently Scientist, product/process development)  PLOF: Independent  PATIENT GOALS: "Just to feel better, I want  more permanent relief."  "A big goal of mine is to get off the prescription pain and anxiety meds."  NEXT MD VISIT: Bilateral C4/C5 and C5/C6 radiofrequency ablation scheduled 9/26 w/ Wynonia Lawman R  OBJECTIVE:   DIAGNOSTIC FINDINGS:  NCV w/ EMG WNL on 03/14/2023  PATIENT SURVEYS:  NDI 25/50 = moderate disability rating LEFS 28/80 = high impaired LE function; pt notes many tasks as rated on form are difficult due to UE NOT LE pain  COGNITION: Overall cognitive status: Within functional limits for tasks assessed  SENSATION: Light touch: WFL and pt reports less sensation on left  POSTURE: rounded shoulders and forward head  PALPATION: Patient is tender over C7 and cervicothoracic junction.   CERVICAL ROM:   Active ROM A/PROM (deg) eval  Flexion WNL; painful  Extension WNL  Right lateral flexion "  Left lateral flexion "  Right rotation "  Left rotation "   (Blank rows = not tested)  UPPER EXTREMITY ROM:  Active ROM Right eval Left eval  Shoulder flexion [facility]  Shoulder extension   Shoulder abduction   Shoulder adduction   Shoulder extension   Shoulder internal rotation   Shoulder external rotation   Elbow flexion   Elbow extension   Wrist flexion   Wrist extension   Wrist ulnar deviation    Wrist radial deviation    Wrist pronation    Wrist supination     (Blank rows = not tested)  UPPER EXTREMITY MMT:  MMT Right eval Left eval  Shoulder flexion 4/5 4-/5  Shoulder extension    Shoulder abduction 4+/5 4+/5  Shoulder adduction    Shoulder extension    Shoulder internal rotation    Shoulder external rotation    Middle trapezius    Lower trapezius    Elbow flexion 5/5 4+/5  Elbow extension    Wrist flexion    Wrist extension    Wrist ulnar deviation    Wrist radial deviation    Wrist pronation    Wrist supination    Grip  strength TBD TBD   (Blank rows = not tested)  CERVICAL SPECIAL TESTS:  Upper limb tension test (ULTT): To be assessed  (mainly LUE) and Distraction test: Positive - pt uses a traction unit at home.  FUNCTIONAL TESTS:  5 times sit to stand: 14.47 seconds no UE support  TODAY'S TREATMENT:                                                                                                                              DATE: 05/28/2023 Supine cervical isometrics: -Rotation to left and right x10 w/ 2 second hold; reports nerve pain down left arm and leg w/ bilateral activation -Flexion x10 w/ 2 second hold; reports pain in back of neck and jaw -Retraction x10 w/ 2 second hold; tingling down left posterior shoulder blade and arm -Lateral flexion x10 w/ 2 second hold each side; left collarbone ache and left posterior shoulder tingling w/ left activation, reported muscle spasm in right side of neck w/ right activation -Chin tuck 2x10; pt reports tension headache forming band around top of head into anterior chest area  -Supine serratus anterior punch x20 each UE no resistance > x20 w/ 5lb weight; pt has anterior chest tension with and without weight -Supine cervical AROM x20 each side working into tolerated increased ROM -Seated side lean stretch x20 seconds each side -Seated open books x10 each side -Supine open book stretch using foam roll x2 minutes, increased left shoulder shooting pains -  PATIENT EDUCATION:  Education details:  Person educated: Patient Education method: Explanation Education comprehension: verbalized understanding and needs further education  HOME EXERCISE PROGRAM: Continue 15 minute walks.  Access Code: [email or web address] URL: [email or web address] Date: 05/28/2023 Prepared by: Camille Bal  Exercises - Tip Pinch with Putty  - 1 x daily - 7 x weekly - 1 sets - 10 reps - Key Pinch with Putty  - 1 x daily - 7 x weekly - 1 sets - 10 reps - 3-Point Pinch with Putty  - 1 x daily - 7 x weekly - 1 sets - 10 reps - Seated Finger Extension with Putty  - 1 x daily - 7 x weekly - 1 sets  - 10 reps - Seated Finger Composite Flexion with Putty  - 1 x daily - 7 x weekly - 1 sets - 10 reps - Sit to Stand with Arms Crossed  - 1 x daily - 7 x weekly - 2 sets - 10 reps - Supine Scapular Protraction in Flexion with Dumbbells  - 1 x daily - 4-5 x weekly - 2-3 sets - 10 reps - Supine Cervical Retraction with Towel  - 1 x daily - 7 x weekly - 1-2 sets - 10 reps - 2 seconds hold - Supine Chin Tuck  - 1 x daily - 7 x weekly - 2 sets - 10 reps - Open Book Chest Stretch on Towel Roll  - 1 x daily - 7 x weekly -  1 sets - 2 reps - 1 minutes hold  ASSESSMENT:  CLINICAL IMPRESSION: Completed assessments this visit with grip weakness noted only mildly outside ratio of non-dominant to dominant side.  Pt has experienced increased symptoms since nerve ablation procedure, but has follow-ups in place.  Established introductory HEP to address weakness identified today and functionally without exacerbating cervical pain in post-ablation setting.  Will further address cervical mobility in coming visit.  OBJECTIVE IMPAIRMENTS: decreased activity tolerance, decreased knowledge of condition, decreased strength, impaired flexibility, improper body mechanics, postural dysfunction, and pain.   ACTIVITY LIMITATIONS: carrying, lifting, bending, squatting, reach over head, and locomotion level  PARTICIPATION LIMITATIONS: meal prep, cleaning, laundry, and occupation  PERSONAL FACTORS: Fitness, Past/current experiences, Sex, Time since onset of injury/illness/exacerbation, and 1-2 comorbidities: migraines  are also affecting patient's functional outcome.   REHAB POTENTIAL: Good  CLINICAL DECISION MAKING: Evolving/moderate complexity  EVALUATION COMPLEXITY: Moderate   GOALS: Goals reviewed with patient? Yes  SHORT TERM GOALS: Target date: 05/25/2023  Pt will be independent and compliant with initial mobility and pain management focused HEP in order to maintain functional progress and improve  mobility. Baseline: To be established. Goal status: INITIAL  2.  Pt will decrease 5xSTS to </=12 seconds w/o UE use in order to demonstrate decreased risk for falls and improved functional bilateral LE strength and power. Baseline: 14.47 seconds no UE support Goal status: INITIAL  LONG TERM GOALS: Target date: 06/22/2023  Pt will be independent and compliant with finalized mobility and pain management focused HEP in order to maintain functional progress and improve mobility. Baseline: To be established. Goal status: INITIAL  2.  Grip strength to be assessed with goal set as appropriate. Baseline:  No significant difference, subjective feeling of weakness, HEP to address. Goal status: REVISED - D/C'd 10/17  3.  ULTT to be assessed w/ goal set as appropriate. Baseline:  Not needed. Goal status: REVISED - D/C'd 10/17  4.  Patient will adhere to walking program >/=3 days a week in order to improve activity tolerance and global mobility. Baseline:  To be established. Goal status: INITIAL  5.  Patient will improve NDI score to </=20/50 in order to demonstrate improved neck pain and improved quality of life. Baseline: 25/50 Goal status: INITIAL  6.  Patient will improve LEFS score to >/=37/80 in order to demonstrate improved LE function and mobility. Baseline: 28/80 Goal status: INITIAL  PLAN:  PT FREQUENCY: 1x/week  PT DURATION: 8 weeks  PLANNED INTERVENTIONS: Therapeutic exercises, Therapeutic activity, Neuromuscular re-education, Balance training, Gait training, Patient/Family education, Self Care, Joint mobilization, Stair training, Vestibular training, Dry Needling, Electrical stimulation, Spinal mobilization, Moist heat, Taping, Traction, Manual therapy, and Re-evaluation  PLAN FOR NEXT SESSION: Add to functional LE strength, and cervical mobility focused HEP.  STM.  Rows, lat pulls, bicep curls, tricep extension, cervical stretching.  Sadie Haber, PT,  DPT 05/28/2023, 2:27 PM

## 2023-06-03 ENCOUNTER — Encounter: Payer: Self-pay | Admitting: Cardiology

## 2023-06-03 DIAGNOSIS — R0683 Snoring: Secondary | ICD-10-CM

## 2023-06-04 ENCOUNTER — Ambulatory Visit: Payer: 59 | Admitting: Physical Therapy

## 2023-06-04 ENCOUNTER — Encounter: Payer: Self-pay | Admitting: Physical Therapy

## 2023-06-04 DIAGNOSIS — M792 Neuralgia and neuritis, unspecified: Secondary | ICD-10-CM

## 2023-06-04 DIAGNOSIS — M6281 Muscle weakness (generalized): Secondary | ICD-10-CM

## 2023-06-04 DIAGNOSIS — M5481 Occipital neuralgia: Secondary | ICD-10-CM

## 2023-06-04 DIAGNOSIS — M542 Cervicalgia: Secondary | ICD-10-CM

## 2023-06-04 DIAGNOSIS — M5414 Radiculopathy, thoracic region: Secondary | ICD-10-CM

## 2023-06-04 DIAGNOSIS — R252 Cramp and spasm: Secondary | ICD-10-CM

## 2023-06-04 DIAGNOSIS — M79602 Pain in left arm: Secondary | ICD-10-CM | POA: Diagnosis not present

## 2023-06-04 NOTE — Therapy (Addendum)
OUTPATIENT PHYSICAL THERAPY CERVICAL TREATMENT   Patient Name: Sheri Johnson MRN: [id number] DOB:1986/06/02, 37 y.o., female Today's Date: 06/04/2023  END OF SESSION:  PT End of Session - 06/04/23 1328     Visit Number 4    Number of Visits 9   8 + eval   Date for PT Re-Evaluation 07/06/23   pushed out due to scheduling conflicts for patient.   Authorization Type [employer]    PT Start Time 1320    PT Stop Time 1400    PT Time Calculation (min) 40 min    Activity Tolerance Patient limited by pain;Treatment limited secondary to medical complications (Comment)   nausea   Behavior During Therapy [facility] for tasks assessed/performed               Past Medical History:  Diagnosis Date   Abortion 11/14/2021   Anxiety    Asthma    Frequent headaches    Hypertension    Hypothyroid    SVT (supraventricular tachycardia) (HCC)    Past Surgical History:  Procedure Laterality Date   CESAREAN SECTION     x2   LAPAROSCOPIC APPENDECTOMY N/A 12/14/2016   Procedure: APPENDECTOMY LAPAROSCOPIC;  Surgeon: Griselda Miner, MD;  Location: [facility] OR;  Service: General;  Laterality: N/A;   SVT ABLATION N/A 03/31/2022   Procedure: SVT ABLATION;  Surgeon: Marinus Maw, MD;  Location: [facility] INVASIVE CV LAB;  Service: Cardiovascular;  Laterality: N/A;   Patient Active Problem List   Diagnosis Date Noted   Neck pain 03/14/2023   Paresthesia 03/14/2023   Sciatica associated with disorder of lumbar spine 03/14/2023   Migraine without aura and without status migrainosus, not intractable 03/14/2023   Witnessed episode of apnea 01/16/2023   Hypertension 12/14/2021   Palpitations 12/14/2021   Chest pain 12/14/2021   SVT (supraventricular tachycardia) (HCC) 12/13/2021   Acute appendicitis 12/14/2016   Appendicitis 12/14/2016    PCP: Porfirio Oar, PA  REFERRING PROVIDER: Levert Feinstein, MD  REFERRING DIAG: [id number] (ICD-10-CM) - Sciatica associated with disorder of lumbar spine M54.2 (ICD-10-CM) - Neck  pain R20.2 (ICD-10-CM) - Paresthesia  THERAPY DIAG:  Pain in left arm  Bilateral occipital neuralgia  Neuralgia and neuritis  Muscle weakness (generalized)  Cramp and spasm  Cervicalgia  Radiculopathy, thoracic region  Rationale for Evaluation and Treatment: Rehabilitation  ONSET DATE: 2.5 years  SUBJECTIVE:  SUBJECTIVE STATEMENT: Patient reports she is very stiff today and having more tingling and tremoring of left foot at rest.  Patient did 2 yoga sessions last week, one of which her HR was 180 and her instructor made her stop and do a breathing exercise to bring it to 105bpm.  She feels perhaps her pain response was too high at that time.  She has been doing quite a few walks since last visit. Hand dominance: Right  PERTINENT HISTORY:  Hx of SVT and chest pain, sciatica, migraines  PAIN:  Are you having pain? Yes: NPRS scale: 8/10 Pain location: lower neck (pt gestures to C7 area) down to bilateral shoulders; left hemibody Pain description: throbbing, burning, stiffness, tingling Aggravating factors: sitting for long periods of time, certain sleeping postures - side-lying mainly, bad GI and allergy day Relieving factors: moving around  PRECAUTIONS: None  RED FLAGS: Bowel or bladder incontinence: Yes: recent urge incontinence - GI aware (internal ultrasound scheduled for next month)     WEIGHT BEARING RESTRICTIONS: No  FALLS:  Has patient fallen in last 6 months? No  LIVING ENVIRONMENT: Lives with: lives with their family Lives in: House/apartment Stairs: Yes: Internal: 15-16 steps; can reach both Has following equipment at home: None  OCCUPATION: accounting - desk work (recently Scientist, product/process development)  PLOF: Independent  PATIENT GOALS: "Just to feel better, I  want more permanent relief."  "A big goal of mine is to get off the prescription pain and anxiety meds."  NEXT MD VISIT: Bilateral C4/C5 and C5/C6 radiofrequency ablation scheduled 9/26 w/ Wynonia Lawman R  OBJECTIVE:   DIAGNOSTIC FINDINGS:  NCV w/ EMG WNL on 03/14/2023  PATIENT SURVEYS:  NDI 25/50 = moderate disability rating LEFS 28/80 = high impaired LE function; pt notes many tasks as rated on form are difficult due to UE NOT LE pain  COGNITION: Overall cognitive status: Within functional limits for tasks assessed  SENSATION: Light touch: WFL and pt reports less sensation on left  POSTURE: rounded shoulders and forward head  PALPATION: Patient is tender over C7 and cervicothoracic junction.   CERVICAL ROM:   Active ROM A/PROM (deg) eval  Flexion WNL; painful  Extension WNL  Right lateral flexion "  Left lateral flexion "  Right rotation "  Left rotation "   (Blank rows = not tested)  UPPER EXTREMITY ROM:  Active ROM Right eval Left eval  Shoulder flexion [facility]  Shoulder extension   Shoulder abduction   Shoulder adduction   Shoulder extension   Shoulder internal rotation   Shoulder external rotation   Elbow flexion   Elbow extension   Wrist flexion   Wrist extension   Wrist ulnar deviation    Wrist radial deviation    Wrist pronation    Wrist supination     (Blank rows = not tested)  UPPER EXTREMITY MMT:  MMT Right eval Left eval  Shoulder flexion 4/5 4-/5  Shoulder extension    Shoulder abduction 4+/5 4+/5  Shoulder adduction    Shoulder extension    Shoulder internal rotation    Shoulder external rotation    Middle trapezius    Lower trapezius    Elbow flexion 5/5 4+/5  Elbow extension    Wrist flexion    Wrist extension    Wrist ulnar deviation    Wrist radial deviation    Wrist pronation    Wrist supination    Grip strength TBD TBD   (Blank rows = not tested)  CERVICAL  SPECIAL TESTS:  Upper limb tension test (ULTT): To be  assessed (mainly LUE) and Distraction test: Positive - pt uses a traction unit at home.  FUNCTIONAL TESTS:  5 times sit to stand: 14.47 seconds no UE support  TODAY'S TREATMENT:                                                                                                                              DATE: 06/04/2023 Discussed pt adherence to medication written dose schedule as she is only taking most nerve/muscle spasm medications every other day once a day.  Edu on MD prescribed therapeutic doses/schedules as written in EPIC and pt logging symptoms if concerned for side effects in order to have ongoing discussion with her MD regarding further management.   Trigger Point Dry-Needling  Treatment instructions: Expect mild to moderate muscle soreness. S/S of pneumothorax if dry needled over a lung field, and to seek immediate medical attention should they occur. Patient verbalized understanding of these instructions and education.  Patient Consent Given: Yes Education handout provided: No - pt has had DN previously Muscles treated: L and R upper traps Treatment response/outcome: deep ache/pressure; muscle twitch detected; pt does have onset of nausea so deferred DN of cervical musculature this date   Pt nauseous following TPDN - provided emesis bag and gingerale, monitored pt without further adverse response until return to baseline symptoms.  -5xSTS:  14.10 seconds   -Cervical extension SNAGs x20 -Cervical rotation SNAGs x20 each direction; pt reports visual spots during bilateral rotation that did not resolve when activity stopped, pt reports this is not new.   Trigger Point Dry Needling  What is Trigger Point Dry Needling (DN)? DN is a physical therapy technique used to treat muscle pain and dysfunction. Specifically, DN helps deactivate muscle trigger points (muscle knots).  A thin filiform needle is used to penetrate the skin and stimulate the underlying trigger point. The goal is  for a local twitch response (LTR) to occur and for the trigger point to relax. No medication of any kind is injected during the procedure.   What Does Trigger Point Dry Needling Feel Like?  The procedure feels different for each individual patient. Some patients report that they do not actually feel the needle enter the skin and overall the process is not painful. Very mild bleeding may occur. However, many patients feel a deep cramping in the muscle in which the needle was inserted. This is the local twitch response.   How Will I feel after the treatment? Soreness is normal, and the onset of soreness may not occur for a few hours. Typically this soreness does not last longer than two days.  Bruising is uncommon, however; ice can be used to decrease any possible bruising.  In rare cases feeling tired or nauseous after the treatment is normal. In addition, your symptoms may get worse before they get better, this period will typically not last longer than 24 hours.  What Can I do After My Treatment? Increase your hydration by drinking more water for the next 24 hours. You may place ice or heat on the areas treated that have become sore, however, do not use heat on inflamed or bruised areas. Heat often brings more relief post needling. You can continue your regular activities, but vigorous activity is not recommended initially after the treatment for 24 hours. DN is best combined with other physical therapy such as strengthening, stretching, and other therapies.    PATIENT EDUCATION:  Education details:  Continue walking, yoga, and HEP as able.  Discussed pt's concern that she may have PD, edu on PD presentation and symptoms.  Provided therapeutic listening and reassurance based on current presentation.  Deferred diagnostic specifics to neurologist.  Discussed pt  symptoms and continued POC. TPDN (see above) Person educated: Patient Education method: Explanation Education comprehension: verbalized  understanding and needs further education  HOME EXERCISE PROGRAM: Continue 15 minute walks.  Access Code: [email or web address] URL: [email or web address] Date: 05/28/2023 Prepared by: Camille Bal  Exercises - Tip Pinch with Putty  - 1 x daily - 7 x weekly - 1 sets - 10 reps - Key Pinch with Putty  - 1 x daily - 7 x weekly - 1 sets - 10 reps - 3-Point Pinch with Putty  - 1 x daily - 7 x weekly - 1 sets - 10 reps - Seated Finger Extension with Putty  - 1 x daily - 7 x weekly - 1 sets - 10 reps - Seated Finger Composite Flexion with Putty  - 1 x daily - 7 x weekly - 1 sets - 10 reps - Sit to Stand with Arms Crossed  - 1 x daily - 7 x weekly - 2 sets - 10 reps - Supine Scapular Protraction in Flexion with Dumbbells  - 1 x daily - 4-5 x weekly - 2-3 sets - 10 reps - Supine Cervical Retraction with Towel  - 1 x daily - 7 x weekly - 1-2 sets - 10 reps - 2 seconds hold - Supine Chin Tuck  - 1 x daily - 7 x weekly - 2 sets - 10 reps - Open Book Chest Stretch on Towel Roll  - 1 x daily - 7 x weekly - 1 sets - 2 reps - 1 minutes hold  ASSESSMENT:  CLINICAL IMPRESSION: Patient limited by pain response to TPDN today.  PT feels this most related to high level of pain at onset of session and some anxiety regarding increased pain upon interventions.  Various education provided today as above regarding symptoms and pt concerns.  She continues to benefit from skilled PT to address symptoms as able and within PT scope.  Will continue per POC with more focus on STM next session to compliment techniques attempted today.  OBJECTIVE IMPAIRMENTS: decreased activity tolerance, decreased knowledge of condition, decreased strength, impaired flexibility, improper body mechanics, postural dysfunction, and pain.   ACTIVITY LIMITATIONS: carrying, lifting, bending, squatting, reach over head, and locomotion level  PARTICIPATION LIMITATIONS: meal prep, cleaning, laundry, and occupation  PERSONAL FACTORS:  Fitness, Past/current experiences, Sex, Time since onset of injury/illness/exacerbation, and 1-2 comorbidities: migraines  are also affecting patient's functional outcome.   REHAB POTENTIAL: Good  CLINICAL DECISION MAKING: Evolving/moderate complexity  EVALUATION COMPLEXITY: Moderate   GOALS: Goals reviewed with patient? Yes  SHORT TERM GOALS: Target date: 05/25/2023  Pt will be independent and compliant with initial mobility and pain management focused HEP in order to maintain functional progress and  improve mobility. Baseline: Pt compliant to current copy (10/28) Goal status: MET  2.  Pt will decrease 5xSTS to </=12 seconds w/o UE use in order to demonstrate decreased risk for falls and improved functional bilateral LE strength and power. Baseline: 14.47 seconds no UE support; 14.10 seconds no UE support (10/28) Goal status: IN PROGRESS  LONG TERM GOALS: Target date: 06/22/2023  Pt will be independent and compliant with finalized mobility and pain management focused HEP in order to maintain functional progress and improve mobility. Baseline: To be established. Goal status: INITIAL  2.  Grip strength to be assessed with goal set as appropriate. Baseline:  No significant difference, subjective feeling of weakness, HEP to address. Goal status: REVISED - D/C'd 10/17  3.  ULTT to be assessed w/ goal set as appropriate. Baseline:  Not needed. Goal status: REVISED - D/C'd 10/17  4.  Patient will adhere to walking program >/=3 days a week in order to improve activity tolerance and global mobility. Baseline:  To be established. Goal status: INITIAL  5.  Patient will improve NDI score to </=20/50 in order to demonstrate improved neck pain and improved quality of life. Baseline: 25/50 Goal status: INITIAL  6.  Patient will improve LEFS score to >/=37/80 in order to demonstrate improved LE function and mobility. Baseline: 28/80 Goal status: INITIAL  PLAN:  PT FREQUENCY:  1x/week  PT DURATION: 8 weeks  PLANNED INTERVENTIONS: Therapeutic exercises, Therapeutic activity, Neuromuscular re-education, Balance training, Gait training, Patient/Family education, Self Care, Joint mobilization, Stair training, Vestibular training, Dry Needling, Electrical stimulation, Spinal mobilization, Moist heat, Taping, Traction, Manual therapy, and Re-evaluation  PLAN FOR NEXT SESSION: Add to functional LE strength, and cervical mobility focused HEP.  STM.  Rows, lat pulls, bicep curls, tricep extension, cervical stretching.  MRI results?  Any open appts to block Ladona Ridgel for TPDN.  Sadie Haber, PT, DPT Peter Congo, PT, DPT, CSRS  06/04/2023, 2:07 PM

## 2023-06-05 ENCOUNTER — Ambulatory Visit (INDEPENDENT_AMBULATORY_CARE_PROVIDER_SITE_OTHER): Payer: 59

## 2023-06-05 ENCOUNTER — Ambulatory Visit
Admission: EM | Admit: 2023-06-05 | Discharge: 2023-06-05 | Disposition: A | Discharge status: Home / Self Care | Payer: 59 | Attending: Internal Medicine | Admitting: Internal Medicine

## 2023-06-05 DIAGNOSIS — J0100 Acute maxillary sinusitis, unspecified: Secondary | ICD-10-CM

## 2023-06-05 DIAGNOSIS — R051 Acute cough: Secondary | ICD-10-CM

## 2023-06-05 DIAGNOSIS — J209 Acute bronchitis, unspecified: Secondary | ICD-10-CM | POA: Diagnosis not present

## 2023-06-05 DIAGNOSIS — J4521 Mild intermittent asthma with (acute) exacerbation: Secondary | ICD-10-CM | POA: Diagnosis not present

## 2023-06-05 MED ORDER — AMOXICILLIN-POT CLAVULANATE 875-125 MG PO TABS
1.0000 | ORAL_TABLET | Freq: Two times a day (BID) | ORAL | 0 refills | Status: AC
Start: 2023-06-05 — End: 2023-06-15

## 2023-06-05 MED ORDER — BENZONATATE 200 MG PO CAPS
200.0000 mg | ORAL_CAPSULE | Freq: Three times a day (TID) | ORAL | 0 refills | Status: DC | PRN
Start: 2023-06-05 — End: 2023-06-26

## 2023-06-05 MED ORDER — ALBUTEROL SULFATE HFA 108 (90 BASE) MCG/ACT IN AERS
1.0000 | INHALATION_SPRAY | Freq: Four times a day (QID) | RESPIRATORY_TRACT | 0 refills | Status: AC | PRN
Start: 2023-06-05 — End: ?

## 2023-06-05 NOTE — ED Provider Notes (Signed)
[facility]    CSN: [id number] Arrival date & time: 06/05/23  2130      History   Chief Complaint No chief complaint on file.   HPI Sheri Johnson is a 37 y.o. female  presents for evaluation of URI symptoms for 2 weeks. Patient reports associated symptoms of cough, congestion, wheezing/gurgling of her left pressure/pain, postnasal drip, intermittent fevers. Denies N/V/D, body aches. Patient does have a hx of asthma.  She has a maintenance inhaler as well as an albuterol inhaler that she has been using with good control of her symptoms.  Patient does not have a history of smoking.  Reports son has a sinus infection.  Pt has taken nasal sprays, allergy medicine OTC for symptoms. Pt has no other concerns at this time.   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   Migraine without aura and without status migrainosus, not intractable 03/14/2023   Witnessed episode of apnea 01/16/2023   Hypertension 12/14/2021   Palpitations 12/14/2021   Chest pain 12/14/2021   SVT (supraventricular tachycardia) (HCC) 12/13/2021   Acute appendicitis 12/14/2016   Appendicitis 12/14/2016    Past Surgical History:  Procedure Laterality Date   CESAREAN SECTION     x2   LAPAROSCOPIC APPENDECTOMY N/A 12/14/2016   Procedure: APPENDECTOMY LAPAROSCOPIC;  Surgeon: Griselda Miner, MD;  Location: [facility] OR;  Service: General;  Laterality: N/A;   SVT ABLATION N/A 03/31/2022   Procedure: SVT ABLATION;  Surgeon: Marinus Maw, MD;  Location: [facility] INVASIVE CV LAB;  Service: Cardiovascular;  Laterality: N/A;    OB History   No obstetric history on file.      Home Medications    Prior  to Admission medications   Medication Sig Start Date End Date Taking? Authorizing Provider  albuterol (VENTOLIN HFA) 108 (90 Base) MCG/ACT inhaler Inhale 1-2 puffs into the lungs every 6 (six) hours as needed for wheezing or shortness of breath. 06/05/23  Yes Radford Pax, NP  amoxicillin-clavulanate (AUGMENTIN) 875-125 MG tablet Take 1 tablet by mouth every 12 (twelve) hours for 10 days. 06/05/23 06/15/23 Yes Radford Pax, NP  benzonatate (TESSALON) 200 MG capsule Take 1 capsule (200 mg total) by mouth 3 (three) times daily as needed. 06/05/23  Yes Radford Pax, NP  ALPRAZolam (XANAX XR) 0.5 MG 24 hr tablet Take by mouth. Patient not taking: Reported on 04/24/2023 07/25/22   [provider]  ALPRAZolam (XANAX XR) 2 MG 24 hr tablet Take 2 mg by mouth 2 (two) times daily.    [provider]  Ascorbic Acid (VITAMIN C) 1000 MG tablet Take 1,000 mg by mouth daily.    [provider]  atenolol (TENORMIN) 25 MG tablet Take 1 tablet (25 mg total) by mouth 2 (two) times daily. 07/27/22   Azalee Course, PA  b complex vitamins capsule Take 1 capsule by mouth daily.    [provider]  celecoxib (CELEBREX) 50 MG capsule Take 1 capsule (50 mg total) by mouth 2 (two) times daily as needed for pain. 03/14/23   Levert Feinstein, MD  cetirizine (ZYRTEC) 10 MG tablet Take 10  mg by mouth 2 (two) times daily.    [provider]  clobetasol (TEMOVATE) 0.05 % external solution Apply 1 Application topically at bedtime. Apply to scalp 02/27/22   [provider]  Digestive Enzymes CAPS Take 1 capsule by mouth 2 (two) times daily before a meal.    [provider]  ferrous sulfate 325 (65 FE) MG EC tablet Take 1 tablet (325 mg total) by mouth daily with breakfast. 10/11/22   Ocie Doyne, MD  fluticasone (FLONASE) 50 MCG/ACT nasal spray Place 1 spray into both nostrils daily as needed for allergies or rhinitis. Patient not taking: Reported on 02/12/2023    [provider]  fluticasone (FLOVENT HFA) 44 MCG/ACT inhaler Inhale 1 puff into the lungs 2 (two) times daily. Patient not taking: Reported on 02/12/2023    [provider]  gabapentin (NEURONTIN) 300 MG capsule Take 300 mg by mouth as needed. 01/11/23   [provider]  ibuprofen (ADVIL) 800 MG tablet Take 800 mg by mouth every 8 (eight) hours as needed for moderate pain.    [provider]  levothyroxine (SYNTHROID) 100 MCG tablet Take 100 mcg by mouth every morning.    [provider]  loperamide (IMODIUM) 2 MG capsule Take 1 capsule (2 mg total) by mouth 2 (two) times daily as needed for diarrhea or loose stools. Patient not taking: Reported on 02/12/2023 11/19/22   Wallis Bamberg, PA-C  Magnesium 400 MG CAPS Take 400 mg by mouth daily as needed (Low Magnesium).    [provider]  omeprazole (PRILOSEC) 40 MG capsule Take 40 mg by mouth daily. 01/22/23   [provider]  OVER THE COUNTER MEDICATION Take 2 tablets by mouth daily. Medication: Beet Root Supplement    [provider]  potassium chloride SA (KLOR-CON M) 20 MEQ tablet Take 1 tablet (20 mEq total) by mouth daily. 12/14/21   Tereso Newcomer T, PA-C  Probiotic Product (PROBIOTIC DAILY PO) Take 2 each by mouth at bedtime.    [provider]  promethazine (PHENERGAN) 25 MG tablet Take 1 tablet (25 mg total) by mouth every 6 (six) hours as needed for nausea or vomiting. Patient not taking: Reported on 02/12/2023 11/19/22   Wallis Bamberg, PA-C  QVAR REDIHALER 40 MCG/ACT inhaler Inhale 2 puffs into the lungs at bedtime. 01/15/23   [provider]  TURMERIC CURCUMIN PO Take 2 capsules by mouth daily.    [provider]    Family History Family History  Problem Relation Age of Onset   Hypertension Mother    Hyperthyroidism Mother     Social History Social History   Tobacco Use   Smoking status: Never   Smokeless tobacco: Never  Vaping Use   Vaping status:  Never Used  Substance Use Topics   Alcohol use: Not Currently    Comment: Rare   Drug use: Never     Allergies   Metoprolol, Cephalexin, Cipro [ciprofloxacin hcl], Prednisone, and Sulfamethoxazole-trimethoprim   Review of Systems Review of Systems  Constitutional:  Positive for fever.  HENT:  Positive for congestion, postnasal drip, sinus pressure and sinus pain.   Respiratory:  Positive for cough and wheezing.      Physical Exam Triage Vital Signs ED Triage Vitals  Encounter Vitals Group     BP 06/05/23 0835 117/80     Systolic BP Percentile --      Diastolic BP Percentile --      Pulse Rate 06/05/23 0835 83  Resp 06/05/23 0835 17     Temp 06/05/23 0835 98.3 F (36.8 C)     Temp Source 06/05/23 0835 Oral     SpO2 06/05/23 0835 96 %     Weight --      Height --      Head Circumference --      Peak Flow --      Pain Score 06/05/23 0834 5     Pain Loc --      Pain Education --      Exclude from Growth Chart --    No data found.  Updated Vital Signs BP 117/80 (BP Location: Left Arm)   Pulse 83   Temp 98.3 F (36.8 C) (Oral)   Resp 17   LMP 05/27/2023 (Exact Date)   SpO2 96%   Visual Acuity Right Eye Distance:   Left Eye Distance:   Bilateral Distance:    Right Eye Near:   Left Eye Near:    Bilateral Near:     Physical Exam Vitals and nursing note reviewed.  Constitutional:      General: She is not in acute distress.    Appearance: She is well-developed. She is not ill-appearing.  HENT:     Head: Normocephalic and atraumatic.     Right Ear: Tympanic membrane and ear canal normal.     Left Ear: Tympanic membrane and ear canal normal.     Nose: Congestion present.     Right Turbinates: Not swollen or pale.     Left Turbinates: Swollen and pale.     Right Sinus: No maxillary sinus tenderness or frontal sinus tenderness.     Left Sinus: Maxillary sinus tenderness and frontal sinus tenderness present.     Mouth/Throat:     Mouth: Mucous membranes  are moist.     Pharynx: Oropharynx is clear. Uvula midline. No posterior oropharyngeal erythema.     Tonsils: No tonsillar exudate or tonsillar abscesses.  Eyes:     Conjunctiva/sclera: Conjunctivae normal.     Pupils: Pupils are equal, round, and reactive to light.  Cardiovascular:     Rate and Rhythm: Normal rate and regular rhythm.     Heart sounds: Normal heart sounds.  Pulmonary:     Effort: Pulmonary effort is normal.     Breath sounds: Rhonchi present.     Comments: Rhonchi left lower lung Musculoskeletal:     Cervical back: Normal range of motion and neck supple.  Lymphadenopathy:     Cervical: No cervical adenopathy.  Skin:    General: Skin is warm and dry.  Neurological:     General: No focal deficit present.     Mental Status: She is alert and oriented to person, place, and time.  Psychiatric:        Mood and Affect: Mood normal.        Behavior: Behavior normal.      UC Treatments / Results  Labs (all labs ordered are listed, but only abnormal results are displayed) Labs Reviewed - No data to display  EKG   Radiology DG Chest 2 View  Result Date: 06/05/2023 CLINICAL DATA:  36 year old female with cough and wheezing for 2 weeks. EXAM: CHEST - 2 VIEW COMPARISON:  CTA chest 01/03/2023 and earlier. FINDINGS: PA and lateral views at 0853 hours. Lung volumes and mediastinal contours remain normal. Mild chronic left lung base scarring. No pneumothorax, pulmonary edema, pleural effusion or acute pulmonary opacity. Visualized tracheal air column is within normal limits. No  acute osseous abnormality identified. Paucity of bowel gas in the visible abdomen. IMPRESSION: No acute cardiopulmonary abnormality.  Mild left lung base scarring. Electronically Signed   By: Odessa Fleming M.D.   On: 06/05/2023 08:59    Procedures Procedures (including critical care time)  Medications Ordered in UC Medications - No data to display  Initial Impression / Assessment and Plan / UC Course   I have reviewed the triage vital signs and the nursing notes.  Pertinent labs & imaging results that were available during my care of the patient were reviewed by me and considered in my medical decision making (see chart for details).     Reviewed exam and symptoms with patient.  No red flags.  Chest x-ray negative for pneumonia.  Given exam and symptoms we will start Augmentin for sinusitis.  Refilled her albuterol inhaler.  Tessalon as needed for cough.  Patient to continue nasal sprays and nasal rinses.  PCP follow-up 2 to 3 days for recheck.  ER precautions reviewed and patient verbalized understanding. Final Clinical Impressions(s) / UC Diagnoses   Final diagnoses:  Acute cough  Acute maxillary sinusitis, recurrence not specified  Mild intermittent asthma with acute exacerbation  Acute bronchitis, unspecified organism     Discharge Instructions      Start Augmentin twice daily for 10 days.  Tessalon as needed for cough.  I have refilled your albuterol inhaler as needed.  Lots of rest and fluids.  Continue your nasal sprays daily.  Please follow-up with your PCP in 2 to 3 days for recheck.  Please go to the ER for any worsening symptoms.  I hope you feel better soon!     ED Prescriptions     Medication Sig Dispense Auth. Provider   amoxicillin-clavulanate (AUGMENTIN) 875-125 MG tablet Take 1 tablet by mouth every 12 (twelve) hours for 10 days. 20 tablet Radford Pax, NP   albuterol (VENTOLIN HFA) 108 (90 Base) MCG/ACT inhaler Inhale 1-2 puffs into the lungs every 6 (six) hours as needed for wheezing or shortness of breath. 1 each Radford Pax, NP   benzonatate (TESSALON) 200 MG capsule Take 1 capsule (200 mg total) by mouth 3 (three) times daily as needed. 20 capsule Radford Pax, NP      PDMP not reviewed this encounter.   Radford Pax, NP 06/05/23 [phone]

## 2023-06-05 NOTE — Discharge Instructions (Signed)
Start Augmentin twice daily for 10 days.  Tessalon as needed for cough.  I have refilled your albuterol inhaler as needed.  Lots of rest and fluids.  Continue your nasal sprays daily.  Please follow-up with your PCP in 2 to 3 days for recheck.  Please go to the ER for any worsening symptoms.  I hope you feel better soon!

## 2023-06-05 NOTE — ED Triage Notes (Signed)
Pt presents with c/o lt side sinus pressure, otc meds have not worked. Pt states she feels pain on the lt side of her rib and back. Has a dry cough and wheezing.

## 2023-06-08 ENCOUNTER — Other Ambulatory Visit: Payer: Self-pay

## 2023-06-08 ENCOUNTER — Emergency Department (HOSPITAL_BASED_OUTPATIENT_CLINIC_OR_DEPARTMENT_OTHER): Payer: 59

## 2023-06-08 ENCOUNTER — Encounter (HOSPITAL_BASED_OUTPATIENT_CLINIC_OR_DEPARTMENT_OTHER): Payer: Self-pay

## 2023-06-08 ENCOUNTER — Emergency Department (HOSPITAL_BASED_OUTPATIENT_CLINIC_OR_DEPARTMENT_OTHER)
Admission: EM | Admit: 2023-06-08 | Discharge: 2023-06-08 | Disposition: A | Discharge status: Home / Self Care | Payer: 59 | Attending: Emergency Medicine | Admitting: Emergency Medicine

## 2023-06-08 DIAGNOSIS — Z7951 Long term (current) use of inhaled steroids: Secondary | ICD-10-CM | POA: Diagnosis not present

## 2023-06-08 DIAGNOSIS — J45909 Unspecified asthma, uncomplicated: Secondary | ICD-10-CM | POA: Insufficient documentation

## 2023-06-08 DIAGNOSIS — I10 Essential (primary) hypertension: Secondary | ICD-10-CM | POA: Insufficient documentation

## 2023-06-08 DIAGNOSIS — Z1152 Encounter for screening for COVID-19: Secondary | ICD-10-CM | POA: Insufficient documentation

## 2023-06-08 DIAGNOSIS — R0602 Shortness of breath: Secondary | ICD-10-CM | POA: Diagnosis present

## 2023-06-08 DIAGNOSIS — E039 Hypothyroidism, unspecified: Secondary | ICD-10-CM | POA: Insufficient documentation

## 2023-06-08 DIAGNOSIS — Z79899 Other long term (current) drug therapy: Secondary | ICD-10-CM | POA: Insufficient documentation

## 2023-06-08 DIAGNOSIS — J189 Pneumonia, unspecified organism: Secondary | ICD-10-CM

## 2023-06-08 DIAGNOSIS — J181 Lobar pneumonia, unspecified organism: Secondary | ICD-10-CM | POA: Diagnosis not present

## 2023-06-08 LAB — CBC WITH DIFFERENTIAL/PLATELET
Abs Immature Granulocytes: 0.03 10*3/uL (ref 0.00–0.07)
Basophils Absolute: 0 10*3/uL (ref 0.0–0.1)
Basophils Relative: 0 %
Eosinophils Absolute: 0.2 10*3/uL (ref 0.0–0.5)
Eosinophils Relative: 2 %
HCT: 41 % (ref 36.0–46.0)
Hemoglobin: 14.1 g/dL (ref 12.0–15.0)
Immature Granulocytes: 0 %
Lymphocytes Relative: 16 %
Lymphs Abs: 1.4 10*3/uL (ref 0.7–4.0)
MCH: 29.3 pg (ref 26.0–34.0)
MCHC: 34.4 g/dL (ref 30.0–36.0)
MCV: 85.2 fL (ref 80.0–100.0)
Monocytes Absolute: 0.7 10*3/uL (ref 0.1–1.0)
Monocytes Relative: 8 %
Neutro Abs: 6.5 10*3/uL (ref 1.7–7.7)
Neutrophils Relative %: 74 %
Platelets: 240 10*3/uL (ref 150–400)
RBC: 4.81 MIL/uL (ref 3.87–5.11)
RDW: 12.7 % (ref 11.5–15.5)
WBC: 8.8 10*3/uL (ref 4.0–10.5)
nRBC: 0 % (ref 0.0–0.2)

## 2023-06-08 LAB — BASIC METABOLIC PANEL
Anion gap: 9 (ref 5–15)
BUN: 9 mg/dL (ref 6–20)
CO2: 25 mmol/L (ref 22–32)
Calcium: 8.9 mg/dL (ref 8.9–10.3)
Chloride: 100 mmol/L (ref 98–111)
Creatinine, Ser: 0.93 mg/dL (ref 0.44–1.00)
GFR, Estimated: >60 mL/min (ref >60)
Glucose, Bld: 103 mg/dL — ABNORMAL HIGH (ref 70–99)
Potassium: 4.2 mmol/L (ref 3.5–5.1)
Sodium: 134 mmol/L — ABNORMAL LOW (ref 135–145)

## 2023-06-08 LAB — RESP PANEL BY RT-PCR (RSV, FLU A&B, COVID)  RVPGX2
Influenza A by PCR: NEGATIVE
Influenza B by PCR: NEGATIVE
Resp Syncytial Virus by PCR: NEGATIVE
SARS Coronavirus 2 by RT PCR: NEGATIVE

## 2023-06-08 LAB — HCG, SERUM, QUALITATIVE: Preg, Serum: NEGATIVE

## 2023-06-08 MED ORDER — AZITHROMYCIN 250 MG PO TABS: 250.0000 mg | ORAL_TABLET | Freq: Every day | ORAL | 0 refills | Status: DC

## 2023-06-08 MED ORDER — MAGNESIUM SULFATE 2 GM/50ML IV SOLN
2.0000 g | Freq: Once | INTRAVENOUS | Status: AC
  Administered 2023-06-08: 2 g via INTRAVENOUS
  Filled 2023-06-08: qty 50

## 2023-06-08 MED ORDER — DOXYCYCLINE HYCLATE 100 MG PO CAPS: 100.0000 mg | ORAL_CAPSULE | Freq: Two times a day (BID) | ORAL | 0 refills | Status: DC

## 2023-06-08 MED ORDER — IPRATROPIUM-ALBUTEROL 0.5-2.5 (3) MG/3ML IN SOLN
3.0000 mL | Freq: Once | RESPIRATORY_TRACT | Status: AC
  Administered 2023-06-08: 3 mL via RESPIRATORY_TRACT
  Filled 2023-06-08: qty 3

## 2023-06-08 NOTE — ED Provider Notes (Signed)
[place] [facility] Provider Note   CSN: [id number] Arrival date & time: 06/08/23  7425     History  Chief Complaint  Patient presents with   Shortness of Breath    Sheri Johnson is a 37 y.o. female history of hypothyroid, SVT, hypertension, anxiety, asthma presented with shortness of breath and wheezing since Monday.  Patient went to go see urgent care on Tuesday and was diagnosed with a sinusitis and given Augmentin which she has been taking but states that she feels persistently short of breath.  Patient does note that last night her oxygen was in the low 80s using a pulse ox.  Patient denies any abdominal pain, chest pain, leg swelling, recent travel, previous PE or blood clots, recent hospitalization/surgeries/travel, fevers.  Patient states she has been able to eat and drink without issue.  Patient is concerned that the sinus infection has caused an asthma exacerbation.  Patient has been using her albuterol inhaler at home to no relief.   Home Medications Prior to Admission medications   Medication Sig Start Date End Date Taking? Authorizing Provider  azithromycin (ZITHROMAX) 250 MG tablet Take 1 tablet (250 mg total) by mouth daily. Take first 2 tablets together, then 1 every day until finished. 06/08/23  Yes Keilen Kahl, Beverly Gust, PA-C  albuterol (VENTOLIN HFA) 108 (90 Base) MCG/ACT inhaler Inhale 1-2 puffs into the lungs every 6 (six) hours as needed for wheezing or shortness of breath. 06/05/23   Radford Pax, NP  ALPRAZolam (XANAX XR) 0.5 MG 24 hr tablet Take by mouth. Patient not taking: Reported on 04/24/2023 07/25/22   [provider]  ALPRAZolam (XANAX XR) 2 MG 24 hr tablet Take 2 mg by mouth 2 (two) times daily.    [provider]  amoxicillin-clavulanate (AUGMENTIN) 875-125 MG tablet Take 1 tablet by mouth every 12 (twelve) hours for 10 days. 06/05/23 06/15/23  Radford Pax, NP  Ascorbic Acid (VITAMIN C) 1000 MG tablet  Take 1,000 mg by mouth daily.    [provider]  atenolol (TENORMIN) 25 MG tablet Take 1 tablet (25 mg total) by mouth 2 (two) times daily. 07/27/22   Azalee Course, PA  b complex vitamins capsule Take 1 capsule by mouth daily.    [provider]  benzonatate (TESSALON) 200 MG capsule Take 1 capsule (200 mg total) by mouth 3 (three) times daily as needed. 06/05/23   Radford Pax, NP  celecoxib (CELEBREX) 50 MG capsule Take 1 capsule (50 mg total) by mouth 2 (two) times daily as needed for pain. 03/14/23   Levert Feinstein, MD  cetirizine (ZYRTEC) 10 MG tablet Take 10 mg by mouth 2 (two) times daily.    [provider]  clobetasol (TEMOVATE) 0.05 % external solution Apply 1 Application topically at bedtime. Apply to scalp 02/27/22   [provider]  Digestive Enzymes CAPS Take 1 capsule by mouth 2 (two) times daily before a meal.    [provider]  ferrous sulfate 325 (65 FE) MG EC tablet Take 1 tablet (325 mg total) by mouth daily with breakfast. 10/11/22   Ocie Doyne, MD  fluticasone (FLONASE) 50 MCG/ACT nasal spray Place 1 spray into both nostrils daily as needed for allergies or rhinitis. Patient not taking: Reported on 02/12/2023    [provider]  fluticasone (FLOVENT HFA) 44 MCG/ACT inhaler Inhale 1 puff into the lungs 2 (two) times daily. Patient not taking: Reported on 02/12/2023    [provider]  gabapentin (NEURONTIN) 300 MG capsule Take 300 mg by mouth as needed. 01/11/23   [provider]  ibuprofen (ADVIL) 800 MG tablet Take 800 mg by mouth every 8 (eight) hours as needed for moderate pain.    [provider]  levothyroxine (SYNTHROID) 100 MCG tablet Take 100 mcg by mouth every morning.    [provider]  loperamide (IMODIUM) 2 MG capsule Take 1 capsule (2 mg total) by mouth 2 (two) times daily as needed for diarrhea or loose stools. Patient not taking: Reported on 02/12/2023 11/19/22   Wallis Bamberg, PA-C   Magnesium 400 MG CAPS Take 400 mg by mouth daily as needed (Low Magnesium).    [provider]  omeprazole (PRILOSEC) 40 MG capsule Take 40 mg by mouth daily. 01/22/23   [provider]  OVER THE COUNTER MEDICATION Take 2 tablets by mouth daily. Medication: Beet Root Supplement    [provider]  potassium chloride SA (KLOR-CON M) 20 MEQ tablet Take 1 tablet (20 mEq total) by mouth daily. 12/14/21   Tereso Newcomer T, PA-C  Probiotic Product (PROBIOTIC DAILY PO) Take 2 each by mouth at bedtime.    [provider]  promethazine (PHENERGAN) 25 MG tablet Take 1 tablet (25 mg total) by mouth every 6 (six) hours as needed for nausea or vomiting. Patient not taking: Reported on 02/12/2023 11/19/22   Wallis Bamberg, PA-C  QVAR REDIHALER 40 MCG/ACT inhaler Inhale 2 puffs into the lungs at bedtime. 01/15/23   [provider]  TURMERIC CURCUMIN PO Take 2 capsules by mouth daily.    [provider]      Allergies    Metoprolol, Cephalexin, Cipro [ciprofloxacin hcl], Prednisone, and Sulfamethoxazole-trimethoprim    Review of Systems   Review of Systems  Respiratory:  Positive for shortness of breath.     Physical Exam Updated Vital Signs BP 135/80   Pulse 96   Temp 99.2 F (37.3 C) (Oral)   Resp 14   Ht 5\' 9"  (1.753 m)   Wt 104.3 kg   LMP 05/27/2023 (Exact Date)   SpO2 97%   BMI 33.97 kg/m  Physical Exam Vitals reviewed.  Constitutional:      General: She is not in acute distress. HENT:     Head: Normocephalic and atraumatic.  Eyes:     Extraocular Movements: Extraocular movements intact.     Conjunctiva/sclera: Conjunctivae normal.     Pupils: Pupils are equal, round, and reactive to light.  Cardiovascular:     Rate and Rhythm: Normal rate and regular rhythm.     Pulses: Normal pulses.     Heart sounds: Normal heart sounds.     Comments: 2+ bilateral radial/dorsalis pedis pulses with regular rate Pulmonary:     Effort: Pulmonary  effort is normal. No respiratory distress.     Breath sounds: Normal breath sounds.     Comments: SpO2 100% in the room Speaking in full sentences Abdominal:     Palpations: Abdomen is soft.     Tenderness: There is no abdominal tenderness. There is no guarding or rebound.  Musculoskeletal:        General: Normal range of motion.     Cervical back: Normal range of motion and neck supple.     Comments: 5 out of 5 bilateral grip/leg extension strength  Skin:    General: Skin is warm and dry.     Capillary Refill: Capillary refill takes less than 2 seconds.  Neurological:  General: No focal deficit present.     Mental Status: She is alert and oriented to person, place, and time.     Comments: Sensation intact in all 4 limbs  Psychiatric:        Mood and Affect: Mood normal.     ED Results / Procedures / Treatments   Labs (all labs ordered are listed, but only abnormal results are displayed) Labs Reviewed  BASIC METABOLIC PANEL - Abnormal; Notable for the following components:      Result Value   Sodium 134 (*)    Glucose, Bld 103 (*)    All other components within normal limits  RESP PANEL BY RT-PCR (RSV, FLU A&B, COVID)  [id number]  CBC WITH DIFFERENTIAL/PLATELET  HCG, SERUM, QUALITATIVE    EKG None  Radiology DG Chest 2 View  Result Date: 06/08/2023 CLINICAL DATA:  Shortness of breath, sinus infection, chest tightness EXAM: CHEST - 2 VIEW COMPARISON:  06/05/2023 FINDINGS: Increased heterogeneous opacities in the mid left lung, most likely in the left upper lobe. Scarring versus prominent epicardial fat in the left lung base. No pleural effusion or pneumothorax. Normal cardiac and mediastinal contours. No acute osseous abnormality. IMPRESSION: Increased heterogeneous opacities in the mid left lung, most likely in the left upper lobe, concerning for pneumonia. Electronically Signed   By: Wiliam Ke M.D.   On: 06/08/2023 11:38    Procedures Procedures    Medications  Ordered in ED Medications  ipratropium-albuterol (DUONEB) 0.5-2.5 (3) MG/3ML nebulizer solution 3 mL (3 mLs Nebulization Given 06/08/23 1002)  magnesium sulfate IVPB 2 g 50 mL (2 g Intravenous New Bag/Given 06/08/23 1029)    ED Course/ Medical Decision Making/ A&P                                 Medical Decision Making Amount and/or Complexity of Data Reviewed Labs: ordered. Radiology: ordered.  Risk Prescription drug management.   Kijana Estock 37 y.o. presented today for shortness of breath.  Working DDx that I considered at this time includes, but not limited to, asthma/COPD exacerbation, URI, viral illness, anemia, ACS, PE, pneumonia, pleural effusion, lung cancer.  R/o DDx: asthma/COPD exacerbation, URI, viral illness, anemia, ACS, PE, pleural effusion, lung cancer: These are considered less likely due to history of present illness, physical exam, labs/imaging findings  Review of prior external notes: 06/05/2023 ED  Unique Tests and My Interpretation:  CBC: Unremarkable BMP: Unremarkable EKG: Sinus 86 bpm Serum pregnancy: Unremarkable CXR: Left-sided pneumonia Respiratory Panel: Negative  Social Determinants of Health: none  Discussion with Independent Historian: None  Discussion of Management of Tests: None  Risk: Medium: prescription drug management  Risk Stratification Score:  PERC: 0  Plan: On exam patient was in no acute distress with stable vitals.  Patient's physical dam was largely unremarkable as patient was speaking in full sentences and did not have any adventitious lung sounds.  Patient's SpO2 in the room with the pulse ox on her fingers was on 100% and did not drop while in the room.  Patient states that last night she had a pulse ox on her finger that read low 80s and when I spoke to the patient she states that was on one of the fingers painted black and so I suspect that the pain to the nail altered the pulse ox is reading as patient does not appear  to be at 80% SpO2 clinically or objectively with these  readings and physical exam.  Patient states she recent was diagnosed with a sinusitis and is concerned this is most likely causing asthma exacerbation and so we will give DuoNeb and mag as patient does have allergy to prednisone.  Will get chest x-ray and basic labs.  Will ambulate patient with pulse ox as well.  Patient was ambulated with pulse ox and did not become hypoxic.  Upon my independent interpretation of the chest x-ray does appear that there is a left-sided pneumonia and radiologist also states this and his report.  Will treat with azithromycin as patient is already on Augmentin for the sinusitis.  Patient given work note.  Patient encouraged to follow-up with primary care provider and to continue using albuterol inhaler at home as prescribed.  Will not prescribe prednisone as in the past patient has gone into SVT when given prednisone.  Encourage patient use Tylenol and Motrin every 6 hours as needed for pain and to remain hydrated food as tolerated.  Patient was given return precautions. Patient stable for discharge at this time.  Patient verbalized understanding of plan.  This chart was dictated using voice recognition software.  Despite best efforts to proofread,  errors can occur which can change the documentation meaning.         Final Clinical Impression(s) / ED Diagnoses Final diagnoses:  Pneumonia of left upper lobe due to infectious organism    Rx / DC Orders ED Discharge Orders          Ordered    doxycycline (VIBRAMYCIN) 100 MG capsule  2 times daily,   Status:  Discontinued        06/08/23 1145    azithromycin (ZITHROMAX) 250 MG tablet  Daily        06/08/23 1147              Netta Corrigan, [place] 06/08/23 1216    Tegeler, Canary Brim, MD 06/08/23 1316

## 2023-06-08 NOTE — ED Triage Notes (Signed)
The patient having shortness of breath and wheezing at home since Monday. She stated her sp02 was low at home last night. She has a hx of asthma. She has had been diagnosed with a sinus infection earlier this week.

## 2023-06-08 NOTE — ED Notes (Signed)
   06/08/23 1021  Resting  Supplemental oxygen during test? No  Resting Heart Rate 95  Resting Sp02 99  Lap 1 (250 feet)  HR 105  02 Sat 98   Patient ambulated from room 8 to radiology.  HR max 105, SpO2 98-100%, denies increases work of breathing, states chest still fills "tight".

## 2023-06-08 NOTE — Discharge Instructions (Addendum)
Please follow-up with your primary care provider in regards to recent symptoms and ER visit.  Today your labs and physical were reassuring and the chest x-ray shows that you have a left-sided pneumonia which would be causing your symptoms.  I prescribed the antibiotics to take in addition to the ones you are already taking for your sinus infection.  You may take both together.  Please continue use your albuterol inhaler at home and if symptoms change or worsen please return to ER.

## 2023-06-12 ENCOUNTER — Ambulatory Visit: Payer: 59 | Admitting: Physical Therapy

## 2023-06-15 ENCOUNTER — Encounter: Payer: Self-pay | Admitting: Physical Therapy

## 2023-06-15 ENCOUNTER — Ambulatory Visit: Payer: 59 | Attending: Physician Assistant | Admitting: Physical Therapy

## 2023-06-15 DIAGNOSIS — M5414 Radiculopathy, thoracic region: Secondary | ICD-10-CM | POA: Insufficient documentation

## 2023-06-15 DIAGNOSIS — M5481 Occipital neuralgia: Secondary | ICD-10-CM | POA: Insufficient documentation

## 2023-06-15 DIAGNOSIS — M79602 Pain in left arm: Secondary | ICD-10-CM | POA: Diagnosis present

## 2023-06-15 DIAGNOSIS — R252 Cramp and spasm: Secondary | ICD-10-CM | POA: Diagnosis present

## 2023-06-15 DIAGNOSIS — M792 Neuralgia and neuritis, unspecified: Secondary | ICD-10-CM | POA: Diagnosis present

## 2023-06-15 DIAGNOSIS — M6281 Muscle weakness (generalized): Secondary | ICD-10-CM | POA: Insufficient documentation

## 2023-06-15 DIAGNOSIS — M542 Cervicalgia: Secondary | ICD-10-CM | POA: Diagnosis present

## 2023-06-15 NOTE — Therapy (Signed)
OUTPATIENT PHYSICAL THERAPY CERVICAL TREATMENT   Patient Name: Sheri Johnson MRN: [id number] DOB:Jan 09, 1986, 37 y.o., female Today's Date: 06/15/2023  END OF SESSION:  PT End of Session - 06/15/23 1256     Visit Number 5    Number of Visits 9   8 + eval   Date for PT Re-Evaluation 07/06/23   pushed out due to scheduling conflicts for patient.   Authorization Type [employer]    PT Start Time 1236    Activity Tolerance Patient limited by pain    Behavior During Therapy [facility] for tasks assessed/performed               Past Medical History:  Diagnosis Date   Abortion 11/14/2021   Anxiety    Asthma    Frequent headaches    Hypertension    Hypothyroid    SVT (supraventricular tachycardia) (HCC)    Past Surgical History:  Procedure Laterality Date   CESAREAN SECTION     x2   LAPAROSCOPIC APPENDECTOMY N/A 12/14/2016   Procedure: APPENDECTOMY LAPAROSCOPIC;  Surgeon: Griselda Miner, MD;  Location: [facility] OR;  Service: General;  Laterality: N/A;   SVT ABLATION N/A 03/31/2022   Procedure: SVT ABLATION;  Surgeon: Marinus Maw, MD;  Location: [facility] INVASIVE CV LAB;  Service: Cardiovascular;  Laterality: N/A;   Patient Active Problem List   Diagnosis Date Noted   Neck pain 03/14/2023   Paresthesia 03/14/2023   Sciatica associated with disorder of lumbar spine 03/14/2023   Migraine without aura and without status migrainosus, not intractable 03/14/2023   Witnessed episode of apnea 01/16/2023   Hypertension 12/14/2021   Palpitations 12/14/2021   Chest pain 12/14/2021   SVT (supraventricular tachycardia) (HCC) 12/13/2021   Acute appendicitis 12/14/2016   Appendicitis 12/14/2016    PCP: Porfirio Oar, PA  REFERRING PROVIDER: Levert Feinstein, MD  REFERRING DIAG: [phone] (ICD-10-CM) - Sciatica associated with disorder of lumbar spine M54.2 (ICD-10-CM) - Neck pain R20.2 (ICD-10-CM) - Paresthesia  THERAPY DIAG:  Pain in left arm  Bilateral occipital neuralgia  Neuralgia and  neuritis  Muscle weakness (generalized)  Cramp and spasm  Cervicalgia  Radiculopathy, thoracic region  Rationale for Evaluation and Treatment: Rehabilitation  ONSET DATE: 2.5 years  SUBJECTIVE:                                                                                                                                                                                                         SUBJECTIVE STATEMENT: Patient reports she is very concerned about her ongoing issues and inquires about therapist perspective  on recent MRI.  She had one fall backwards where she hit her head on a plush bathmat so denies injury.  She had bilateral leg cramps and fell when standing up. Hand dominance: Right  PERTINENT HISTORY:  Hx of SVT and chest pain, sciatica, migraines  PAIN:  Are you having pain? Yes: NPRS scale: 8/10 Pain location: lower neck (pt gestures to C7 area) down to bilateral shoulders; left hemibody Pain description: throbbing, burning, stiffness, tingling Aggravating factors: sitting for long periods of time, certain sleeping postures - side-lying mainly, bad GI and allergy day Relieving factors: moving around  PRECAUTIONS: None  RED FLAGS: Bowel or bladder incontinence: Yes: recent urge incontinence - GI aware (internal ultrasound scheduled for next month)     WEIGHT BEARING RESTRICTIONS: No  FALLS:  Has patient fallen in last 6 months? No  LIVING ENVIRONMENT: Lives with: lives with their family Lives in: House/apartment Stairs: Yes: Internal: 15-16 steps; can reach both Has following equipment at home: None  OCCUPATION: accounting - desk work (recently Scientist, product/process development)  PLOF: Independent  PATIENT GOALS: "Just to feel better, I want more permanent relief."  "A big goal of mine is to get off the prescription pain and anxiety meds."  NEXT MD VISIT: Bilateral C4/C5 and C5/C6 radiofrequency ablation scheduled 9/26 w/ Wynonia Lawman R  OBJECTIVE:    DIAGNOSTIC FINDINGS:  NCV w/ EMG WNL on 03/14/2023  PATIENT SURVEYS:  NDI 25/50 = moderate disability rating LEFS 28/80 = high impaired LE function; pt notes many tasks as rated on form are difficult due to UE NOT LE pain  COGNITION: Overall cognitive status: Within functional limits for tasks assessed  SENSATION: Light touch: WFL and pt reports less sensation on left  POSTURE: rounded shoulders and forward head  PALPATION: Patient is tender over C7 and cervicothoracic junction.   CERVICAL ROM:   Active ROM A/PROM (deg) eval  Flexion WNL; painful  Extension WNL  Right lateral flexion "  Left lateral flexion "  Right rotation "  Left rotation "   (Blank rows = not tested)  UPPER EXTREMITY ROM:  Active ROM Right eval Left eval  Shoulder flexion [facility]  Shoulder extension   Shoulder abduction   Shoulder adduction   Shoulder extension   Shoulder internal rotation   Shoulder external rotation   Elbow flexion   Elbow extension   Wrist flexion   Wrist extension   Wrist ulnar deviation    Wrist radial deviation    Wrist pronation    Wrist supination     (Blank rows = not tested)  UPPER EXTREMITY MMT:  MMT Right eval Left eval  Shoulder flexion 4/5 4-/5  Shoulder extension    Shoulder abduction 4+/5 4+/5  Shoulder adduction    Shoulder extension    Shoulder internal rotation    Shoulder external rotation    Middle trapezius    Lower trapezius    Elbow flexion 5/5 4+/5  Elbow extension    Wrist flexion    Wrist extension    Wrist ulnar deviation    Wrist radial deviation    Wrist pronation    Wrist supination    Grip strength TBD TBD   (Blank rows = not tested)  CERVICAL SPECIAL TESTS:  Upper limb tension test (ULTT): To be assessed (mainly LUE) and Distraction test: Positive - pt uses a traction unit at home.  FUNCTIONAL TESTS:  5 times sit to stand: 14.47 seconds no UE support  TODAY'S TREATMENT:  DATE: 06/15/2023 Therapeutic listening and encouragement, discussed neck anatomy and terminology to help pt understand MRI results, encouraged ongoing dialogue with neurologist regarding options for improved pain management as pt is not taking increased dose of gabapentin due to not wanting to "put a bandage over the problem instead of a solution".  -STM/IASTM to upper traps, cervical paraspinals, and periscapular muscles for pain management and improved mobility.  Pt has positive pain response, but reports tingling in hands and feet upon sitting up and reports nausea and dizziness.  BP assessed on RUE in sitting:  144/87, HR 77 bpm; pt monitored before partial resolution of symptoms and request to leave at end of session.  PATIENT EDUCATION:  Education details:  Continue walking, yoga, and HEP as able.  Discussed pt's concerns regarding symptoms and possible changes in MRI.  Provided therapeutic listening and reassurance based on current presentation.  Deferred diagnostic specifics to neurologist.  Discussed pt  symptoms and continued POC. Person educated: Patient Education method: Explanation Education comprehension: verbalized understanding and needs further education  HOME EXERCISE PROGRAM: Continue 15 minute walks.  Access Code: [email or web address] URL: [email or web address] Date: 05/28/2023 Prepared by: Camille Bal  Exercises - Tip Pinch with Putty  - 1 x daily - 7 x weekly - 1 sets - 10 reps - Key Pinch with Putty  - 1 x daily - 7 x weekly - 1 sets - 10 reps - 3-Point Pinch with Putty  - 1 x daily - 7 x weekly - 1 sets - 10 reps - Seated Finger Extension with Putty  - 1 x daily - 7 x weekly - 1 sets - 10 reps - Seated Finger Composite Flexion with Putty  - 1 x daily - 7 x weekly - 1 sets - 10 reps - Sit to Stand with Arms Crossed  - 1 x daily - 7 x weekly - 2 sets - 10 reps - Supine Scapular  Protraction in Flexion with Dumbbells  - 1 x daily - 4-5 x weekly - 2-3 sets - 10 reps - Supine Cervical Retraction with Towel  - 1 x daily - 7 x weekly - 1-2 sets - 10 reps - 2 seconds hold - Supine Chin Tuck  - 1 x daily - 7 x weekly - 2 sets - 10 reps - Open Book Chest Stretch on Towel Roll  - 1 x daily - 7 x weekly - 1 sets - 2 reps - 1 minutes hold  ASSESSMENT:  CLINICAL IMPRESSION: Patient remains significantly limited by pain with entirety of session focused on therapeutic listening and education followed by [facility] for pain management.  Patient did has mild adverse symptoms following IASTM, but symptoms showed improvement prior to patient leaving clinic.  Will continue POC as able.  OBJECTIVE IMPAIRMENTS: decreased activity tolerance, decreased knowledge of condition, decreased strength, impaired flexibility, improper body mechanics, postural dysfunction, and pain.   ACTIVITY LIMITATIONS: carrying, lifting, bending, squatting, reach over head, and locomotion level  PARTICIPATION LIMITATIONS: meal prep, cleaning, laundry, and occupation  PERSONAL FACTORS: Fitness, Past/current experiences, Sex, Time since onset of injury/illness/exacerbation, and 1-2 comorbidities: migraines  are also affecting patient's functional outcome.   REHAB POTENTIAL: Good  CLINICAL DECISION MAKING: Evolving/moderate complexity  EVALUATION COMPLEXITY: Moderate   GOALS: Goals reviewed with patient? Yes  SHORT TERM GOALS: Target date: 05/25/2023  Pt will be independent and compliant with initial mobility and pain management focused HEP in order to maintain functional progress and improve mobility. Baseline: Pt compliant to current copy (10/28)  Goal status: MET  2.  Pt will decrease 5xSTS to </=12 seconds w/o UE use in order to demonstrate decreased risk for falls and improved functional bilateral LE strength and power. Baseline: 14.47 seconds no UE support; 14.10 seconds no UE support (10/28) Goal status:  IN PROGRESS  LONG TERM GOALS: Target date: 06/22/2023  Pt will be independent and compliant with finalized mobility and pain management focused HEP in order to maintain functional progress and improve mobility. Baseline: To be established. Goal status: INITIAL  2.  Grip strength to be assessed with goal set as appropriate. Baseline:  No significant difference, subjective feeling of weakness, HEP to address. Goal status: REVISED - D/C'd 10/17  3.  ULTT to be assessed w/ goal set as appropriate. Baseline:  Not needed. Goal status: REVISED - D/C'd 10/17  4.  Patient will adhere to walking program >/=3 days a week in order to improve activity tolerance and global mobility. Baseline:  To be established. Goal status: INITIAL  5.  Patient will improve NDI score to </=20/50 in order to demonstrate improved neck pain and improved quality of life. Baseline: 25/50 Goal status: INITIAL  6.  Patient will improve LEFS score to >/=37/80 in order to demonstrate improved LE function and mobility. Baseline: 28/80 Goal status: INITIAL  PLAN:  PT FREQUENCY: 1x/week  PT DURATION: 8 weeks  PLANNED INTERVENTIONS: Therapeutic exercises, Therapeutic activity, Neuromuscular re-education, Balance training, Gait training, Patient/Family education, Self Care, Joint mobilization, Stair training, Vestibular training, Dry Needling, Electrical stimulation, Spinal mobilization, Moist heat, Taping, Traction, Manual therapy, and Re-evaluation  PLAN FOR NEXT SESSION: Add to functional LE strength, and cervical mobility focused HEP.  STM/IASTM?  Rows, lat pulls, bicep curls, tricep extension, cervical stretching.  MRI results?  Any open appts to block Ladona Ridgel for TPDN.  Sadie Haber, PT, DPT  06/15/2023, 12:57 PM

## 2023-06-18 ENCOUNTER — Encounter: Payer: Self-pay | Admitting: Physical Therapy

## 2023-06-18 ENCOUNTER — Ambulatory Visit: Payer: 59 | Admitting: Physical Therapy

## 2023-06-18 DIAGNOSIS — M5481 Occipital neuralgia: Secondary | ICD-10-CM

## 2023-06-18 DIAGNOSIS — M5414 Radiculopathy, thoracic region: Secondary | ICD-10-CM

## 2023-06-18 DIAGNOSIS — R252 Cramp and spasm: Secondary | ICD-10-CM

## 2023-06-18 DIAGNOSIS — M79602 Pain in left arm: Secondary | ICD-10-CM | POA: Diagnosis not present

## 2023-06-18 DIAGNOSIS — M792 Neuralgia and neuritis, unspecified: Secondary | ICD-10-CM

## 2023-06-18 DIAGNOSIS — M6281 Muscle weakness (generalized): Secondary | ICD-10-CM

## 2023-06-18 DIAGNOSIS — M542 Cervicalgia: Secondary | ICD-10-CM

## 2023-06-18 NOTE — Therapy (Signed)
OUTPATIENT PHYSICAL THERAPY CERVICAL TREATMENT   Patient Name: Sheri Johnson MRN: [id number] DOB:Jan 07, 1986, 37 y.o., female Today's Date: 06/18/2023  END OF SESSION:  PT End of Session - 06/18/23 1322     Visit Number 6    Number of Visits 9   8 + eval   Date for PT Re-Evaluation 07/06/23   pushed out due to scheduling conflicts for patient.   Authorization Type [employer]    PT Start Time 1318    PT Stop Time 1403    PT Time Calculation (min) 45 min    Activity Tolerance Patient tolerated treatment well    Behavior During Therapy [facility] for tasks assessed/performed               Past Medical History:  Diagnosis Date   Abortion 11/14/2021   Anxiety    Asthma    Frequent headaches    Hypertension    Hypothyroid    SVT (supraventricular tachycardia) (HCC)    Past Surgical History:  Procedure Laterality Date   CESAREAN SECTION     x2   LAPAROSCOPIC APPENDECTOMY N/A 12/14/2016   Procedure: APPENDECTOMY LAPAROSCOPIC;  Surgeon: Griselda Miner, MD;  Location: [facility] OR;  Service: General;  Laterality: N/A;   SVT ABLATION N/A 03/31/2022   Procedure: SVT ABLATION;  Surgeon: Marinus Maw, MD;  Location: [facility] INVASIVE CV LAB;  Service: Cardiovascular;  Laterality: N/A;   Patient Active Problem List   Diagnosis Date Noted   Neck pain 03/14/2023   Paresthesia 03/14/2023   Sciatica associated with disorder of lumbar spine 03/14/2023   Migraine without aura and without status migrainosus, not intractable 03/14/2023   Witnessed episode of apnea 01/16/2023   Hypertension 12/14/2021   Palpitations 12/14/2021   Chest pain 12/14/2021   SVT (supraventricular tachycardia) (HCC) 12/13/2021   Acute appendicitis 12/14/2016   Appendicitis 12/14/2016    PCP: Porfirio Oar, PA  REFERRING PROVIDER: Levert Feinstein, MD  REFERRING DIAG: [phone] (ICD-10-CM) - Sciatica associated with disorder of lumbar spine M54.2 (ICD-10-CM) - Neck pain R20.2 (ICD-10-CM) - Paresthesia  THERAPY DIAG:  Pain  in left arm  Bilateral occipital neuralgia  Neuralgia and neuritis  Muscle weakness (generalized)  Cramp and spasm  Cervicalgia  Radiculopathy, thoracic region  Rationale for Evaluation and Treatment: Rehabilitation  ONSET DATE: 2.5 years  SUBJECTIVE:                                                                                                                                                                                                         SUBJECTIVE  STATEMENT: Patient reports she is waiting to speak to neurologist regarding MRIs and further advice vs spine specialist.  She reports STM last session helped some and her pain is a bit better today.  She states she felt nauseous 2 hours after PT, but that subsided.  Patient reports her respiratory symptoms are improved and she has finished her antibiotics. Hand dominance: Right  PERTINENT HISTORY:  Hx of SVT and chest pain, sciatica, migraines  PAIN:  Are you having pain? Yes: NPRS scale: 6/10 Pain location: left face and neck Pain description: "electrocution", tender, tense Aggravating factors: clenching jaw, staring at a screen, sitting too long, chewing too quickly Relieving factors: tension ball, ice  PRECAUTIONS: None  RED FLAGS: Bowel or bladder incontinence: Yes: recent urge incontinence - GI aware (internal ultrasound scheduled for next month)     WEIGHT BEARING RESTRICTIONS: No  FALLS:  Has patient fallen in last 6 months? No  LIVING ENVIRONMENT: Lives with: lives with their family Lives in: House/apartment Stairs: Yes: Internal: 15-16 steps; can reach both Has following equipment at home: None  OCCUPATION: accounting - desk work (recently Scientist, product/process development)  PLOF: Independent  PATIENT GOALS: "Just to feel better, I want more permanent relief."  "A big goal of mine is to get off the prescription pain and anxiety meds."  NEXT MD VISIT: Bilateral C4/C5 and C5/C6 radiofrequency ablation  scheduled 9/26 w/ Wynonia Lawman R  OBJECTIVE:   DIAGNOSTIC FINDINGS:  NCV w/ EMG WNL on 03/14/2023  PATIENT SURVEYS:  NDI 25/50 = moderate disability rating LEFS 28/80 = high impaired LE function; pt notes many tasks as rated on form are difficult due to UE NOT LE pain  COGNITION: Overall cognitive status: Within functional limits for tasks assessed  SENSATION: Light touch: WFL and pt reports less sensation on left  POSTURE: rounded shoulders and forward head  PALPATION: Patient is tender over C7 and cervicothoracic junction.   CERVICAL ROM:   Active ROM A/PROM (deg) eval  Flexion WNL; painful  Extension WNL  Right lateral flexion "  Left lateral flexion "  Right rotation "  Left rotation "   (Blank rows = not tested)  UPPER EXTREMITY ROM:  Active ROM Right eval Left eval  Shoulder flexion [facility]  Shoulder extension   Shoulder abduction   Shoulder adduction   Shoulder extension   Shoulder internal rotation   Shoulder external rotation   Elbow flexion   Elbow extension   Wrist flexion   Wrist extension   Wrist ulnar deviation    Wrist radial deviation    Wrist pronation    Wrist supination     (Blank rows = not tested)  UPPER EXTREMITY MMT:  MMT Right eval Left eval  Shoulder flexion 4/5 4-/5  Shoulder extension    Shoulder abduction 4+/5 4+/5  Shoulder adduction    Shoulder extension    Shoulder internal rotation    Shoulder external rotation    Middle trapezius    Lower trapezius    Elbow flexion 5/5 4+/5  Elbow extension    Wrist flexion    Wrist extension    Wrist ulnar deviation    Wrist radial deviation    Wrist pronation    Wrist supination    Grip strength TBD TBD   (Blank rows = not tested)  CERVICAL SPECIAL TESTS:  Upper limb tension test (ULTT): To be assessed (mainly LUE) and Distraction test: Positive - pt uses a traction unit at home.  FUNCTIONAL TESTS:  5 times sit to stand: 14.47 seconds no UE support  TODAY'S  TREATMENT:                                                                                                                              DATE: 06/18/2023 -STM/IASTM to upper traps, cervical paraspinals, and periscapular muscles for pain management and improved mobility.  Pt has positive pain response, no nausea or dizziness noted today on upright sitting.  Focused primarily on LUE today. -Introduced thera cane and techniques with pt shown demo and allowed time to practice locating and trapping TP for 30 second increments over and around area. -Discussed techniques to use with IASTM tool pt plans on purchasing and how to instruct fiance to help her.  Encouraged to bring to future session if further instruction desired. -Scapular squeezes x10 w/ 2 second  -Rows 2x15 w/ 2 sec squeeze w/ green theraband -Diagonal pulls w/ green theraband x16  PATIENT EDUCATION:  Education details:  Continue walking, yoga, and HEP as able.  Alternating heat and ice as desired for comfort (safe parameters - 20 minutes max w/ skin checks and layering between source and skin).  See above for further.  Advised on which IASTM tool may best serve patient for OOP purchase. Person educated: Patient Education method: Explanation Education comprehension: verbalized understanding and needs further education  HOME EXERCISE PROGRAM: Continue 15 minute walks.  Access Code: [email or web address] URL: [email or web address] Date: 05/28/2023 Prepared by: Camille Bal  Exercises - Tip Pinch with Putty  - 1 x daily - 7 x weekly - 1 sets - 10 reps - Key Pinch with Putty  - 1 x daily - 7 x weekly - 1 sets - 10 reps - 3-Point Pinch with Putty  - 1 x daily - 7 x weekly - 1 sets - 10 reps - Seated Finger Extension with Putty  - 1 x daily - 7 x weekly - 1 sets - 10 reps - Seated Finger Composite Flexion with Putty  - 1 x daily - 7 x weekly - 1 sets - 10 reps - Sit to Stand with Arms Crossed  - 1 x daily - 7 x weekly - 2 sets - 10  reps - Supine Scapular Protraction in Flexion with Dumbbells  - 1 x daily - 4-5 x weekly - 2-3 sets - 10 reps - Supine Cervical Retraction with Towel  - 1 x daily - 7 x weekly - 1-2 sets - 10 reps - 2 seconds hold - Supine Chin Tuck  - 1 x daily - 7 x weekly - 2 sets - 10 reps - Open Book Chest Stretch on Towel Roll  - 1 x daily - 7 x weekly - 1 sets - 2 reps - 1 minutes hold  ASSESSMENT:  CLINICAL IMPRESSION: Patient has better tolerance of interventions today and positive pain response.  She did not experience nausea with IASTM today and was able to complete periscapular movements without  further irritation of pain.  Overall, this appeared to be the patient's best progress with PT thus far so PT to plan to continue IASTM and gentle postural corrections to improve functioning as able.  OBJECTIVE IMPAIRMENTS: decreased activity tolerance, decreased knowledge of condition, decreased strength, impaired flexibility, improper body mechanics, postural dysfunction, and pain.   ACTIVITY LIMITATIONS: carrying, lifting, bending, squatting, reach over head, and locomotion level  PARTICIPATION LIMITATIONS: meal prep, cleaning, laundry, and occupation  PERSONAL FACTORS: Fitness, Past/current experiences, Sex, Time since onset of injury/illness/exacerbation, and 1-2 comorbidities: migraines  are also affecting patient's functional outcome.   REHAB POTENTIAL: Good  CLINICAL DECISION MAKING: Evolving/moderate complexity  EVALUATION COMPLEXITY: Moderate   GOALS: Goals reviewed with patient? Yes  SHORT TERM GOALS: Target date: 05/25/2023  Pt will be independent and compliant with initial mobility and pain management focused HEP in order to maintain functional progress and improve mobility. Baseline: Pt compliant to current copy (10/28) Goal status: MET  2.  Pt will decrease 5xSTS to </=12 seconds w/o UE use in order to demonstrate decreased risk for falls and improved functional bilateral LE  strength and power. Baseline: 14.47 seconds no UE support; 14.10 seconds no UE support (10/28) Goal status: IN PROGRESS  LONG TERM GOALS: Target date: 06/22/2023  Pt will be independent and compliant with finalized mobility and pain management focused HEP in order to maintain functional progress and improve mobility. Baseline: To be established. Goal status: INITIAL  2.  Grip strength to be assessed with goal set as appropriate. Baseline:  No significant difference, subjective feeling of weakness, HEP to address. Goal status: REVISED - D/C'd 10/17  3.  ULTT to be assessed w/ goal set as appropriate. Baseline:  Not needed. Goal status: REVISED - D/C'd 10/17  4.  Patient will adhere to walking program >/=3 days a week in order to improve activity tolerance and global mobility. Baseline:  To be established. Goal status: INITIAL  5.  Patient will improve NDI score to </=20/50 in order to demonstrate improved neck pain and improved quality of life. Baseline: 25/50 Goal status: INITIAL  6.  Patient will improve LEFS score to >/=37/80 in order to demonstrate improved LE function and mobility. Baseline: 28/80 Goal status: INITIAL  PLAN:  PT FREQUENCY: 1x/week  PT DURATION: 8 weeks  PLANNED INTERVENTIONS: Therapeutic exercises, Therapeutic activity, Neuromuscular re-education, Balance training, Gait training, Patient/Family education, Self Care, Joint mobilization, Stair training, Vestibular training, Dry Needling, Electrical stimulation, Spinal mobilization, Moist heat, Taping, Traction, Manual therapy, and Re-evaluation  PLAN FOR NEXT SESSION: Add to functional LE strength, and cervical mobility focused HEP.  STM/IASTM, Chirp wheel - pt has one and needs edu on techniques.  lat pulls, bicep curls, tricep extension, cervical stretching.  Any open appts to block Ladona Ridgel for TPDN.  Sadie Haber, PT, DPT  06/18/2023, 2:06 PM

## 2023-06-24 ENCOUNTER — Encounter: Payer: Self-pay | Admitting: Cardiology

## 2023-06-25 ENCOUNTER — Ambulatory Visit: Payer: 59 | Admitting: Physical Therapy

## 2023-06-26 ENCOUNTER — Encounter: Payer: Self-pay | Admitting: Neurology

## 2023-06-26 ENCOUNTER — Ambulatory Visit (INDEPENDENT_AMBULATORY_CARE_PROVIDER_SITE_OTHER): Payer: 59 | Admitting: Neurology

## 2023-06-26 ENCOUNTER — Telehealth: Payer: Self-pay | Admitting: Neurology

## 2023-06-26 ENCOUNTER — Ambulatory Visit: Payer: 59 | Admitting: Psychiatry

## 2023-06-26 VITALS — BP 125/75 | HR 77 | Ht 69.0 in | Wt 239.0 lb

## 2023-06-26 DIAGNOSIS — M542 Cervicalgia: Secondary | ICD-10-CM | POA: Diagnosis not present

## 2023-06-26 DIAGNOSIS — M5412 Radiculopathy, cervical region: Secondary | ICD-10-CM

## 2023-06-26 DIAGNOSIS — G43009 Migraine without aura, not intractable, without status migrainosus: Secondary | ICD-10-CM

## 2023-06-26 MED ORDER — DULOXETINE HCL 30 MG PO CPEP: 30.0000 mg | ORAL_CAPSULE | Freq: Every day | ORAL | 3 refills | Status: DC

## 2023-06-26 NOTE — Telephone Encounter (Signed)
Referral for Neurosurgery faxed to [facility]. Phone: [phone] Fax: [phone]
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
## 2023-06-27 ENCOUNTER — Ambulatory Visit: Payer: 59 | Admitting: Physical Therapy

## 2023-06-27 ENCOUNTER — Encounter: Payer: Self-pay | Admitting: Physical Therapy

## 2023-06-27 DIAGNOSIS — R252 Cramp and spasm: Secondary | ICD-10-CM

## 2023-06-27 DIAGNOSIS — M542 Cervicalgia: Secondary | ICD-10-CM

## 2023-06-27 DIAGNOSIS — M792 Neuralgia and neuritis, unspecified: Secondary | ICD-10-CM

## 2023-06-27 DIAGNOSIS — M6281 Muscle weakness (generalized): Secondary | ICD-10-CM

## 2023-06-27 DIAGNOSIS — M5481 Occipital neuralgia: Secondary | ICD-10-CM

## 2023-06-27 DIAGNOSIS — M79602 Pain in left arm: Secondary | ICD-10-CM

## 2023-06-27 DIAGNOSIS — M5414 Radiculopathy, thoracic region: Secondary | ICD-10-CM

## 2023-06-27 NOTE — Patient Instructions (Signed)
-   Prone Scapular Retraction Arms at Side  - 1 x daily - 7 x weekly - 2-3 sets - 10 reps

## 2023-06-27 NOTE — Therapy (Signed)
OUTPATIENT PHYSICAL THERAPY CERVICAL TREATMENT   Patient Name: Sheri Johnson MRN: [id number] DOB:March 18, 1986, 37 y.o., female Today's Date: 06/27/2023  END OF SESSION:  PT End of Session - 06/27/23 1110     Visit Number 7    Number of Visits 9   8 + eval   Date for PT Re-Evaluation 07/06/23   pushed out due to scheduling conflicts for patient.   Authorization Type [employer]    PT Start Time 1108    PT Stop Time 1201    PT Time Calculation (min) 53 min    Activity Tolerance Patient tolerated treatment well    Behavior During Therapy [facility] for tasks assessed/performed               Past Medical History:  Diagnosis Date   Abortion 11/14/2021   Anxiety    Asthma    Frequent headaches    Hypertension    Hypothyroid    SVT (supraventricular tachycardia) (HCC)    Past Surgical History:  Procedure Laterality Date   CESAREAN SECTION     x2   LAPAROSCOPIC APPENDECTOMY N/A 12/14/2016   Procedure: APPENDECTOMY LAPAROSCOPIC;  Surgeon: Griselda Miner, MD;  Location: [facility] OR;  Service: General;  Laterality: N/A;   SVT ABLATION N/A 03/31/2022   Procedure: SVT ABLATION;  Surgeon: Marinus Maw, MD;  Location: [facility];  Service: Cardiovascular;  Laterality: N/A;   Patient Active Problem List   Diagnosis Date Noted   Neck pain 03/14/2023   Paresthesia 03/14/2023   Sciatica associated with disorder of lumbar spine 03/14/2023   Migraine without aura and without status migrainosus, not intractable 03/14/2023   Witnessed episode of apnea 01/16/2023   Hypertension 12/14/2021   Palpitations 12/14/2021   Chest pain 12/14/2021   SVT (supraventricular tachycardia) (HCC) 12/13/2021   Acute appendicitis 12/14/2016   Appendicitis 12/14/2016    PCP: Porfirio Oar, PA  REFERRING PROVIDER: Levert Feinstein, MD  REFERRING DIAG: [phone] (ICD-10-CM) - Sciatica associated with disorder of lumbar spine M54.2 (ICD-10-CM) - Neck pain R20.2 (ICD-10-CM) - Paresthesia  THERAPY DIAG:  Pain  in left arm  Bilateral occipital neuralgia  Neuralgia and neuritis  Muscle weakness (generalized)  Cramp and spasm  Cervicalgia  Radiculopathy, thoracic region  Rationale for Evaluation and Treatment: Rehabilitation  ONSET DATE: 2.5 years  SUBJECTIVE:                                                                                                                                                                                                         SUBJECTIVE  STATEMENT: Patient reports she is awaiting call from [facility] for second opinion regarding ongoing symptoms after seeing Dr. Terrace Arabia yesterday.  She is have similar pain today compared to last visit.  She did an all levels yoga class the other day and was able to back off and modify as needed.  She denies falls or acute changes.   Hand dominance: Right  PERTINENT HISTORY:  Hx of SVT and chest pain, sciatica, migraines  PAIN:  Are you having pain? Yes: NPRS scale: 6/10 Pain location: left face and neck Pain description: "electrocution", tender, tense Aggravating factors: clenching jaw, staring at a screen, sitting too long, chewing too quickly, not wearing mouth guard Relieving factors: tension ball, ice  PRECAUTIONS: None  RED FLAGS: Bowel or bladder incontinence: Yes: recent urge incontinence - GI aware (internal ultrasound scheduled for next month)     WEIGHT BEARING RESTRICTIONS: No  FALLS:  Has patient fallen in last 6 months? No  LIVING ENVIRONMENT: Lives with: lives with their family Lives in: House/apartment Stairs: Yes: Internal: 15-16 steps; can reach both Has following equipment at home: None  OCCUPATION: accounting - desk work (recently Scientist, product/process development)  PLOF: Independent  PATIENT GOALS: "Just to feel better, I want more permanent relief."  "A big goal of mine is to get off the prescription pain and anxiety meds."  NEXT MD VISIT: Bilateral C4/C5 and C5/C6 radiofrequency  ablation scheduled 9/26 w/ Wynonia Lawman R  OBJECTIVE:   DIAGNOSTIC FINDINGS:  NCV w/ EMG WNL on 03/14/2023  PATIENT SURVEYS:  NDI 25/50 = moderate disability rating LEFS 28/80 = high impaired LE function; pt notes many tasks as rated on form are difficult due to UE NOT LE pain  COGNITION: Overall cognitive status: Within functional limits for tasks assessed  SENSATION: Light touch: WFL and pt reports less sensation on left  POSTURE: rounded shoulders and forward head  PALPATION: Patient is tender over C7 and cervicothoracic junction.   CERVICAL ROM:   Active ROM A/PROM (deg) eval  Flexion WNL; painful  Extension WNL  Right lateral flexion "  Left lateral flexion "  Right rotation "  Left rotation "   (Blank rows = not tested)  UPPER EXTREMITY ROM:  Active ROM Right eval Left eval  Shoulder flexion [facility]  Shoulder extension   Shoulder abduction   Shoulder adduction   Shoulder extension   Shoulder internal rotation   Shoulder external rotation   Elbow flexion   Elbow extension   Wrist flexion   Wrist extension   Wrist ulnar deviation    Wrist radial deviation    Wrist pronation    Wrist supination     (Blank rows = not tested)  UPPER EXTREMITY MMT:  MMT Right eval Left eval  Shoulder flexion 4/5 4-/5  Shoulder extension    Shoulder abduction 4+/5 4+/5  Shoulder adduction    Shoulder extension    Shoulder internal rotation    Shoulder external rotation    Middle trapezius    Lower trapezius    Elbow flexion 5/5 4+/5  Elbow extension    Wrist flexion    Wrist extension    Wrist ulnar deviation    Wrist radial deviation    Wrist pronation    Wrist supination    Grip strength TBD TBD   (Blank rows = not tested)  CERVICAL SPECIAL TESTS:  Upper limb tension test (ULTT): To be assessed (mainly LUE) and Distraction test: Positive - pt uses a traction  unit at home.  FUNCTIONAL TESTS:  5 times sit to stand: 14.47 seconds no UE support  TODAY'S  TREATMENT:                                                                                                                              DATE: 06/27/2023 -Chirp wheel:  positional myofascial traction x3 minutes > Cervical flexion/extension over blunt points x2 minutes > rotation/side-bending over blunt points x2 minutes > blunt points over mid-thoracic spine for gentle thoracic prolonged stretch/mobilization x 2 minutes -IASTM:  upper traps, suboccipitals, cervical paraspinals and periscapular muscles, initiated manual with general STM techniques before IASTM. -Discussion of STM techniques for instruction to partner for use at home -I's, T's, Y's 2x10, muscle burning reported w/ second set, pt reports "neck instability" due to muscle fatigue in prone positioning. -Had pt perform W's x10, added T's only as pt feels this more in her periscapular muscles than other options.  PATIENT EDUCATION:  Education details:  Continue walking, yoga, and HEP as able.  Discussed chirp wheel uses, traction indications/contraindications and use of intermittent traction.  Pt has good results with 5 minutes of traction and some light UE exercises during this at current.  Discussed concerning symptoms with traction and next steps for safety.  Pt has neck stretcher, showed PT picture, discussed positioning to try for various stretch and mobilization - particularly to suboccipitals and for posterior myofascial stretch.  Massage techniques.  Person educated: Patient Education method: Explanation Education comprehension: verbalized understanding and needs further education  HOME EXERCISE PROGRAM: Continue 15 minute walks.  Access Code: [email or web address] URL: [email or web address] Date: 05/28/2023 Prepared by: Camille Bal  Exercises - Tip Pinch with Putty  - 1 x daily - 7 x weekly - 1 sets - 10 reps - Key Pinch with Putty  - 1 x daily - 7 x weekly - 1 sets - 10 reps - 3-Point Pinch with Putty  - 1 x daily -  7 x weekly - 1 sets - 10 reps - Seated Finger Extension with Putty  - 1 x daily - 7 x weekly - 1 sets - 10 reps - Seated Finger Composite Flexion with Putty  - 1 x daily - 7 x weekly - 1 sets - 10 reps - Sit to Stand with Arms Crossed  - 1 x daily - 7 x weekly - 2 sets - 10 reps - Supine Scapular Protraction in Flexion with Dumbbells  - 1 x daily - 4-5 x weekly - 2-3 sets - 10 reps - Supine Cervical Retraction with Towel  - 1 x daily - 7 x weekly - 1-2 sets - 10 reps - 2 seconds hold - Supine Chin Tuck  - 1 x daily - 7 x weekly - 2 sets - 10 reps - Open Book Chest Stretch on Towel Roll  - 1 x daily - 7 x weekly - 1 sets - 2 reps - 1 minutes hold - Prone  Scapular Retraction Arms at Side  - 1 x daily - 7 x weekly - 2-3 sets - 10 reps  ASSESSMENT:  CLINICAL IMPRESSION: Ongoing focus on STM and IASTM today as pt is responding most positively to these modalities.  Extensive education component today regarding anatomy and positioning for techniques used today so patient and partner can replicate at home for improved pain management.  Was able to progress to periscapular strengthening with pt only limited in prone by cervical fatigue when attempting to hold her head off mat surface.  Will assess LTGs next session to determine further POC.  OBJECTIVE IMPAIRMENTS: decreased activity tolerance, decreased knowledge of condition, decreased strength, impaired flexibility, improper body mechanics, postural dysfunction, and pain.   ACTIVITY LIMITATIONS: carrying, lifting, bending, squatting, reach over head, and locomotion level  PARTICIPATION LIMITATIONS: meal prep, cleaning, laundry, and occupation  PERSONAL FACTORS: Fitness, Past/current experiences, Sex, Time since onset of injury/illness/exacerbation, and 1-2 comorbidities: migraines  are also affecting patient's functional outcome.   REHAB POTENTIAL: Good  CLINICAL DECISION MAKING: Evolving/moderate complexity  EVALUATION COMPLEXITY:  Moderate   GOALS: Goals reviewed with patient? Yes  SHORT TERM GOALS: Target date: 05/25/2023  Pt will be independent and compliant with initial mobility and pain management focused HEP in order to maintain functional progress and improve mobility. Baseline: Pt compliant to current copy (10/28) Goal status: MET  2.  Pt will decrease 5xSTS to </=12 seconds w/o UE use in order to demonstrate decreased risk for falls and improved functional bilateral LE strength and power. Baseline: 14.47 seconds no UE support; 14.10 seconds no UE support (10/28) Goal status: IN PROGRESS  LONG TERM GOALS: Target date: 06/22/2023  Pt will be independent and compliant with finalized mobility and pain management focused HEP in order to maintain functional progress and improve mobility. Baseline: To be established. Goal status: INITIAL  2.  Grip strength to be assessed with goal set as appropriate. Baseline:  No significant difference, subjective feeling of weakness, HEP to address. Goal status: REVISED - D/C'd 10/17  3.  ULTT to be assessed w/ goal set as appropriate. Baseline:  Not needed. Goal status: REVISED - D/C'd 10/17  4.  Patient will adhere to walking program >/=3 days a week in order to improve activity tolerance and global mobility. Baseline:  To be established. Goal status: INITIAL  5.  Patient will improve NDI score to </=20/50 in order to demonstrate improved neck pain and improved quality of life. Baseline: 25/50 Goal status: INITIAL  6.  Patient will improve LEFS score to >/=37/80 in order to demonstrate improved LE function and mobility. Baseline: 28/80 Goal status: INITIAL  PLAN:  PT FREQUENCY: 1x/week  PT DURATION: 8 weeks  PLANNED INTERVENTIONS: Therapeutic exercises, Therapeutic activity, Neuromuscular re-education, Balance training, Gait training, Patient/Family education, Self Care, Joint mobilization, Stair training, Vestibular training, Dry Needling, Electrical  stimulation, Spinal mobilization, Moist heat, Taping, Traction, Manual therapy, and Re-evaluation  PLAN FOR NEXT SESSION: ASSESS LTGs- re-cert vs D/C?  Has she heard from neurosurgery?  Add to functional LE strength, and cervical mobility focused HEP.  STM/IASTM, lat pulls, bicep curls, tricep extension, cervical stretching.  Any open appts to block Ladona Ridgel for TPDN.  Sadie Haber, PT, DPT  06/27/2023, 12:26 PM

## 2023-06-30 ENCOUNTER — Emergency Department (HOSPITAL_BASED_OUTPATIENT_CLINIC_OR_DEPARTMENT_OTHER): Payer: 59

## 2023-06-30 ENCOUNTER — Other Ambulatory Visit: Payer: Self-pay

## 2023-06-30 ENCOUNTER — Encounter (HOSPITAL_BASED_OUTPATIENT_CLINIC_OR_DEPARTMENT_OTHER): Payer: Self-pay | Admitting: Emergency Medicine

## 2023-06-30 ENCOUNTER — Emergency Department (HOSPITAL_BASED_OUTPATIENT_CLINIC_OR_DEPARTMENT_OTHER)
Admission: EM | Admit: 2023-06-30 | Discharge: 2023-06-30 | Disposition: A | Discharge status: Home / Self Care | Payer: 59 | Attending: Emergency Medicine | Admitting: Emergency Medicine

## 2023-06-30 DIAGNOSIS — E669 Obesity, unspecified: Secondary | ICD-10-CM | POA: Insufficient documentation

## 2023-06-30 DIAGNOSIS — R0789 Other chest pain: Secondary | ICD-10-CM

## 2023-06-30 DIAGNOSIS — I10 Essential (primary) hypertension: Secondary | ICD-10-CM | POA: Diagnosis not present

## 2023-06-30 DIAGNOSIS — Z6835 Body mass index (BMI) 35.0-35.9, adult: Secondary | ICD-10-CM | POA: Diagnosis not present

## 2023-06-30 DIAGNOSIS — J45909 Unspecified asthma, uncomplicated: Secondary | ICD-10-CM | POA: Insufficient documentation

## 2023-06-30 DIAGNOSIS — E876 Hypokalemia: Secondary | ICD-10-CM

## 2023-06-30 DIAGNOSIS — R519 Headache, unspecified: Secondary | ICD-10-CM

## 2023-06-30 LAB — BASIC METABOLIC PANEL
Anion gap: 8 (ref 5–15)
BUN: 13 mg/dL (ref 6–20)
CO2: 26 mmol/L (ref 22–32)
Calcium: 9 mg/dL (ref 8.9–10.3)
Chloride: 104 mmol/L (ref 98–111)
Creatinine, Ser: 0.83 mg/dL (ref 0.44–1.00)
GFR, Estimated: >60 mL/min (ref >60)
Glucose, Bld: 110 mg/dL — ABNORMAL HIGH (ref 70–99)
Potassium: 3.4 mmol/L — ABNORMAL LOW (ref 3.5–5.1)
Sodium: 138 mmol/L (ref 135–145)

## 2023-06-30 LAB — CBC
HCT: 40 % (ref 36.0–46.0)
Hemoglobin: 13.4 g/dL (ref 12.0–15.0)
MCH: 28.7 pg (ref 26.0–34.0)
MCHC: 33.5 g/dL (ref 30.0–36.0)
MCV: 85.7 fL (ref 80.0–100.0)
Platelets: 277 10*3/uL (ref 150–400)
RBC: 4.67 MIL/uL (ref 3.87–5.11)
RDW: 12.7 % (ref 11.5–15.5)
WBC: 9.4 10*3/uL (ref 4.0–10.5)
nRBC: 0 % (ref 0.0–0.2)

## 2023-06-30 LAB — TROPONIN I (HIGH SENSITIVITY): Troponin I (High Sensitivity): 2 ng/L (ref <18)

## 2023-06-30 MED ORDER — POTASSIUM CHLORIDE CRYS ER 20 MEQ PO TBCR
40.0000 meq | EXTENDED_RELEASE_TABLET | Freq: Once | ORAL | Status: AC
  Administered 2023-06-30: 40 meq via ORAL
  Filled 2023-06-30: qty 2

## 2023-06-30 MED ORDER — SODIUM CHLORIDE 0.9 % IV BOLUS
1000.0000 mL | Freq: Once | INTRAVENOUS | Status: AC
  Administered 2023-06-30: 1000 mL via INTRAVENOUS

## 2023-06-30 MED ORDER — ACETAMINOPHEN 325 MG PO TABS: 650.0000 mg | ORAL_TABLET | Freq: Four times a day (QID) | ORAL | 0 refills | Status: AC | PRN

## 2023-06-30 MED ORDER — MAGNESIUM SULFATE 2 GM/50ML IV SOLN
2.0000 g | Freq: Once | INTRAVENOUS | Status: AC
  Administered 2023-06-30: 2 g via INTRAVENOUS
  Filled 2023-06-30: qty 50

## 2023-06-30 MED ORDER — DIPHENHYDRAMINE HCL 50 MG/ML IJ SOLN
25.0000 mg | Freq: Once | INTRAMUSCULAR | Status: AC
  Administered 2023-06-30: 25 mg via INTRAVENOUS
  Filled 2023-06-30: qty 1

## 2023-06-30 MED ORDER — ACETAMINOPHEN 500 MG PO TABS
1000.0000 mg | ORAL_TABLET | Freq: Once | ORAL | Status: AC
  Administered 2023-06-30: 1000 mg via ORAL
  Filled 2023-06-30: qty 2

## 2023-06-30 MED ORDER — IBUPROFEN 600 MG PO TABS: 600.0000 mg | ORAL_TABLET | Freq: Four times a day (QID) | ORAL | 0 refills | Status: AC | PRN

## 2023-06-30 MED ORDER — METOCLOPRAMIDE HCL 5 MG/ML IJ SOLN
5.0000 mg | Freq: Once | INTRAMUSCULAR | Status: AC
  Administered 2023-06-30: 5 mg via INTRAVENOUS
  Filled 2023-06-30: qty 2

## 2023-06-30 MED ORDER — LIDOCAINE 5 % EX PTCH: 1.0000 | MEDICATED_PATCH | Freq: Every day | CUTANEOUS | 0 refills | Status: DC | PRN

## 2023-06-30 NOTE — Discharge Instructions (Addendum)
It was a pleasure caring for you today in the emergency department.  Please follow-up with your PCP for potassium level recheck in the next few days.  Please return to the emergency department for any worsening or worrisome symptoms.

## 2023-06-30 NOTE — ED Provider Notes (Signed)
[place] [facility] Provider Note  CSN: [id number] Arrival date & time: 06/30/23 0008  Chief Complaint(s) Chest Pain  HPI Sheri Johnson is a 37 y.o. female with past medical history as below, significant for anxiety, asthma, hypertension, hypothyroid, SVT, appendicitis who presents to the ED with complaint of chest and arm pain this evening with headache.  She has history of sleep apnea, was recently treated for pneumonia.  Woke up this evening difficulty breathing which resolved after a few minutes.  Elevated heart rate which also resolved after few minutes when she was able to sit upright in the bed.  Patient having left-sided chest pain over the past 1 to 2 weeks.  Mild cough that is nonproductive that is lingering since her recent pneumonia.  No palpitations, no syncope or near syncope.  Felt that she is breathing normally currently.  She did wake up with a left-sided headache similar to prior, headache has mostly improved at this point, not associate with numbness, tingling, vision or hearing changes, gait disturbance.    Past Medical History Past Medical History:  Diagnosis Date   Abortion 11/14/2021   Anxiety    Asthma    Frequent headaches    Hypertension    Hypothyroid    SVT (supraventricular tachycardia) [facility])    Patient Active Problem List   Diagnosis Date Noted   Neck pain 03/14/2023   Paresthesia 03/14/2023   Sciatica associated with disorder of lumbar spine 03/14/2023   Migraine without aura and without status migrainosus, not intractable 03/14/2023   Witnessed episode of apnea 01/16/2023   Hypertension 12/14/2021   Palpitations 12/14/2021   Chest pain 12/14/2021   SVT (supraventricular tachycardia) (HCC) 12/13/2021   Acute appendicitis 12/14/2016   Appendicitis 12/14/2016   Home Medication(s) Prior to Admission medications   Medication Sig Start Date End Date Taking? Authorizing Provider  albuterol (VENTOLIN HFA) 108  (90 Base) MCG/ACT inhaler Inhale 1-2 puffs into the lungs every 6 (six) hours as needed for wheezing or shortness of breath. 06/05/23   Radford Pax, NP  ALPRAZolam (XANAX XR) 0.5 MG 24 hr tablet Take by mouth. 07/25/22   [provider]  ALPRAZolam (XANAX XR) 1 MG 24 hr tablet Take 1 mg by mouth as needed.    [provider]  Ascorbic Acid (VITAMIN C) 1000 MG tablet Take 1,000 mg by mouth daily.    [provider]  atenolol (TENORMIN) 25 MG tablet Take 1 tablet (25 mg total) by mouth 2 (two) times daily. 07/27/22   Azalee Course, PA  b complex vitamins capsule Take 1 capsule by mouth daily.    [provider]  cetirizine (ZYRTEC) 10 MG tablet Take 10 mg by mouth 2 (two) times daily.    [provider]  clobetasol (TEMOVATE) 0.05 % external solution Apply 1 Application topically at bedtime. Apply to scalp 02/27/22   [provider]  Digestive Enzymes CAPS Take 1 capsule by mouth 2 (two) times daily before a meal.    [provider]  DULoxetine (CYMBALTA) 30 MG capsule Take 1 capsule (30 mg total) by mouth daily. 06/26/23   Levert Feinstein, MD  ferrous sulfate 325 (65 FE) MG EC tablet Take 1 tablet (325 mg total) by mouth daily with breakfast. 10/11/22   Ocie Doyne, MD  fluticasone (FLONASE) 50 MCG/ACT nasal spray Place 1 spray into both nostrils daily as needed for allergies or rhinitis.    [provider]  gabapentin (NEURONTIN) 300 MG  capsule Take 300 mg by mouth as needed. 01/11/23   [provider]  levothyroxine (SYNTHROID) 100 MCG tablet Take 100 mcg by mouth every morning.    [provider]  Magnesium 400 MG CAPS Take 400 mg by mouth daily as needed (Low Magnesium).    [provider]  omeprazole (PRILOSEC) 40 MG capsule Take 40 mg by mouth daily. 01/22/23   [provider]  OVER THE COUNTER MEDICATION Take 2 tablets by mouth daily. Medication: Beet Root Supplement    [provider]   potassium chloride SA (KLOR-CON M) 20 MEQ tablet Take 1 tablet (20 mEq total) by mouth daily. 12/14/21   Tereso Newcomer T, PA-C  Probiotic Product (PROBIOTIC DAILY PO) Take 2 each by mouth at bedtime.    [provider]  QVAR REDIHALER 40 MCG/ACT inhaler Inhale 2 puffs into the lungs at bedtime. 01/15/23   [provider]  TURMERIC CURCUMIN PO Take 2 capsules by mouth daily.    [provider]                                                                                                                                    Past Surgical History Past Surgical History:  Procedure Laterality Date   CESAREAN SECTION     x2   LAPAROSCOPIC APPENDECTOMY N/A 12/14/2016   Procedure: APPENDECTOMY LAPAROSCOPIC;  Surgeon: Griselda Miner, MD;  Location: [facility] OR;  Service: General;  Laterality: N/A;   SVT ABLATION N/A 03/31/2022   Procedure: SVT ABLATION;  Surgeon: Marinus Maw, MD;  Location: [facility] INVASIVE CV LAB;  Service: Cardiovascular;  Laterality: N/A;   Family History Family History  Problem Relation Age of Onset   Hypertension Mother    Hyperthyroidism Mother     Social History Social History   Tobacco Use   Smoking status: Never   Smokeless tobacco: Never  Vaping Use   Vaping status: Never Used  Substance Use Topics   Alcohol use: Not Currently    Comment: Rare   Drug use: Never   Allergies Metoprolol, Cephalexin, Cipro [ciprofloxacin hcl], Prednisone, and Sulfamethoxazole-trimethoprim  Review of Systems Review of Systems  Constitutional:  Negative for chills and fever.  Respiratory:  Positive for cough and shortness of breath.   Cardiovascular:  Positive for chest pain.  Gastrointestinal:  Negative for abdominal pain and vomiting.  Genitourinary:  Negative for difficulty urinating.  Musculoskeletal:  Negative for neck stiffness.  Skin:  Negative for rash.  Neurological:  Positive for headaches.  All other systems reviewed and are  negative.   Physical Exam Vital Signs  I have reviewed the triage vital signs BP (!) 108/53   Pulse 74   Temp 97.8 F (36.6 C) (Oral)   Resp 19   Wt 108 kg   LMP 06/25/2023 (Exact Date)   SpO2 94%   BMI 35.15 kg/m  Physical Exam Vitals and nursing note  reviewed.  Constitutional:      General: She is not in acute distress.    Appearance: Normal appearance. She is obese. She is not ill-appearing.  HENT:     Head: Normocephalic and atraumatic.     Right Ear: External ear normal.     Left Ear: External ear normal.     Nose: Nose normal.     Mouth/Throat:     Mouth: Mucous membranes are moist.  Eyes:     General: No scleral icterus.       Right eye: No discharge.        Left eye: No discharge.  Cardiovascular:     Rate and Rhythm: Normal rate and regular rhythm.     Pulses: Normal pulses.     Heart sounds: Normal heart sounds.     No friction rub. No gallop. No S3 or S4 sounds.  Pulmonary:     Effort: Pulmonary effort is normal. No respiratory distress.     Breath sounds: Normal breath sounds. No stridor. No decreased breath sounds or wheezing.  Abdominal:     General: Abdomen is flat. There is no distension.     Palpations: Abdomen is soft.     Tenderness: There is no abdominal tenderness.  Musculoskeletal:     Cervical back: No rigidity.     Right lower leg: No edema.     Left lower leg: No edema.  Skin:    General: Skin is warm and dry.     Capillary Refill: Capillary refill takes less than 2 seconds.  Neurological:     Mental Status: She is alert and oriented to person, place, and time.     GCS: GCS eye subscore is 4. GCS verbal subscore is 5. GCS motor subscore is 6.     Cranial Nerves: Cranial nerves 2-12 are intact. No dysarthria or facial asymmetry.     Sensory: Sensation is intact.     Motor: Motor function is intact.     Coordination: Coordination is intact.     Comments: Strength symmetric 5/5   Psychiatric:        Mood and Affect: Mood normal.         Behavior: Behavior normal. Behavior is cooperative.     ED Results and Treatments Labs (all labs ordered are listed, but only abnormal results are displayed) Labs Reviewed  BASIC METABOLIC PANEL - Abnormal; Notable for the following components:      Result Value   Potassium 3.4 (*)    Glucose, Bld 110 (*)    All other components within normal limits  CBC  PREGNANCY, URINE  URINALYSIS, ROUTINE W REFLEX MICROSCOPIC  TROPONIN I (HIGH SENSITIVITY)                                                                                                                          Radiology DG Chest 2 View  Result Date: 06/30/2023 CLINICAL DATA:  Chest pain. EXAM: CHEST - 2 VIEW  COMPARISON:  06/08/2023. FINDINGS: The heart size and mediastinal contours are within normal limits. Mild atelectasis is noted at the left lung base. The right lung is clear. No effusion or pneumothorax. No acute osseous abnormality. IMPRESSION: Minimal atelectasis at the left lung base. Electronically Signed   By: Thornell Sartorius M.D.   On: 06/30/2023 01:31    Pertinent labs & imaging results that were available during my care of the patient were reviewed by me and considered in my medical decision making (see MDM for details).  Medications Ordered in ED Medications  metoCLOPramide (REGLAN) injection 5 mg (5 mg Intravenous Given 06/30/23 0051)  diphenhydrAMINE (BENADRYL) injection 25 mg (25 mg Intravenous Given 06/30/23 0050)  sodium chloride 0.9 % bolus 1,000 mL (0 mLs Intravenous Stopped 06/30/23 0251)  magnesium sulfate IVPB 2 g 50 mL (0 g Intravenous Stopped 06/30/23 0251)  acetaminophen (TYLENOL) tablet 1,000 mg (1,000 mg Oral Given 06/30/23 0058)  potassium chloride SA (KLOR-CON M) CR tablet 40 mEq (40 mEq Oral Given 06/30/23 0254)                                                                                                                                     Procedures Procedures  (including critical care  time)  Medical Decision Making / ED Course    Medical Decision Making:    Peggi Chirillo is a 37 y.o. female with past medical history as below, significant for anxiety, asthma, hypertension, hypothyroid, SVT, appendicitis who presents to the ED with complaint of chest and arm pain waking her from sleep. The complaint involves an extensive differential diagnosis and also carries with it a high risk of complications and morbidity.  Serious etiology was considered. Ddx includes but is not limited to: Differential includes all life-threatening causes for chest pain. This includes but is not exclusive to acute coronary syndrome, aortic dissection, pulmonary embolism, cardiac tamponade, community-acquired pneumonia, pericarditis, musculoskeletal chest wall pain, etc. Differential diagnosis includes but is not exclusive to subarachnoid hemorrhage, meningitis, encephalitis, previous head trauma, cavernous venous thrombosis, muscle tension headache, glaucoma, temporal arteritis, migraine or migraine equivalent, etc.    Complete initial physical exam performed, notably the patient  was in no acute distress, exam is stable.    Reviewed and confirmed nursing documentation for past medical history, family history, social history.  Vital signs reviewed.    Clinical Course as of 06/30/23 0316  Sat Jun 30, 2023  0217 Potassium(!): 3.4 Replete orally [SG]    Clinical Course User Index [SG] Sloan Leiter, DO     Will collect screening labs for ACS, chest x-ray, give analgesics.   On recheck her symptoms are resolved  Patient presents with headache. Based on the patient's history and physical there is very low clinical suspicion for significant intracranial pathology. The headache was not sudden onset, not maximal at onset, there are no neurologic findings on exam, the patient does not have  a fever, the patient does not have any jaw claudication, the patient does not endorse a clotting disorder,  patient denies any trauma or eye pain and the headache is not associated with dizziness, weakness on one side of the body, diplopia, vertigo, slurred speech, or ataxia. Given the extremely low risk of these diagnoses further testing and evaluation for these possibilities does not appear to be indicated at this time.  The patient's chest pain is not suggestive of pulmonary embolus, cardiac ischemia, aortic dissection, pericarditis, myocarditis, pulmonary embolism, pneumothorax, pneumonia, Zoster, or esophageal perforation, or other serious etiology.  Historically not abrupt in onset, tearing or ripping, pulses symmetric. EKG nonspecific for ischemia/infarction. No dysrhythmias, brugada, WPW, prolonged QT noted.   Troponin negative x1 (symptoms ongoing >24 hours). CXR reviewed. Labs without demonstration of acute pathology unless otherwise noted above. Low HEART Score: 0-3 points (0.9-1.7% risk of MACE).]   Chest pain likely atypical, possibly secondary to recent pneumonia  She has low potassium, she is on potassium replacement at home, advised to continue this, given dietary instructions, follow-up PCP for repeat electrolyte levels  Given the extremely low risk of these diagnoses further testing and evaluation for these possibilities does not appear to be indicated at this time. Patient in no distress and overall condition improved here in the ED. Detailed discussions were had with the patient regarding current findings, and need for close f/u with PCP or on call doctor. The patient has been instructed to return immediately if the symptoms worsen in any way for re-evaluation. Patient verbalized understanding and is in agreement with current care plan. All questions answered prior to discharge.                Additional history obtained: -Additional history obtained from na -External records from outside source obtained and reviewed including: Chart review including previous notes, labs,  imaging, consultation notes including  Prior ED visits, home medications, primary care documentation   Lab Tests: -I ordered, reviewed, and interpreted labs.   The pertinent results include:   Labs Reviewed  BASIC METABOLIC PANEL - Abnormal; Notable for the following components:      Result Value   Potassium 3.4 (*)    Glucose, Bld 110 (*)    All other components within normal limits  CBC  PREGNANCY, URINE  URINALYSIS, ROUTINE W REFLEX MICROSCOPIC  TROPONIN I (HIGH SENSITIVITY)    Notable for k mild low  EKG   EKG Interpretation Date/Time:  Saturday June 30 2023 00:15:01 EST Ventricular Rate:  94 PR Interval:  151 QRS Duration:  84 QT Interval:  460 QTC Calculation: 576 R Axis:   51  Text Interpretation: Sinus rhythm Low voltage, precordial leads Borderline T abnormalities, anterior leads Prolonged QT interval similar to prior no stemi Confirmed by Tanda Rockers ([id number]) on 06/30/2023 2:03:26 AM         Imaging Studies ordered: I ordered imaging studies including chest x-ray I independently visualized the following imaging with scope of interpretation limited to determining acute life threatening conditions related to emergency care; findings noted above I independently visualized and interpreted imaging. I agree with the radiologist interpretation   Medicines ordered and prescription drug management: Meds ordered this encounter  Medications   metoCLOPramide (REGLAN) injection 5 mg   diphenhydrAMINE (BENADRYL) injection 25 mg   sodium chloride 0.9 % bolus 1,000 mL   magnesium sulfate IVPB 2 g 50 mL   acetaminophen (TYLENOL) tablet 1,000 mg   potassium chloride SA (KLOR-CON M) CR tablet  40 mEq    -I have reviewed the patients home medicines and have made adjustments as needed   Consultations Obtained: Not applicable  Cardiac Monitoring: The patient was maintained on a cardiac monitor.  I personally viewed and interpreted the cardiac monitored which showed  an underlying rhythm of: NSR Continuous pulse oximetry interpreted by myself, 99% on RA.    Social Determinants of Health:  Diagnosis or treatment significantly limited by social determinants of health: obesity   Reevaluation: After the interventions noted above, I reevaluated the patient and found that they have improved  Co morbidities that complicate the patient evaluation  Past Medical History:  Diagnosis Date   Abortion 11/14/2021   Anxiety    Asthma    Frequent headaches    Hypertension    Hypothyroid    SVT (supraventricular tachycardia) (HCC)       Dispostion: Disposition decision including need for hospitalization was considered, and patient discharged from emergency department.    Final Clinical Impression(s) / ED Diagnoses Final diagnoses:  Atypical chest pain  Hypokalemia        Sloan Leiter, DO 06/30/23 [id number]

## 2023-06-30 NOTE — ED Triage Notes (Signed)
Patient reports waking up out of her sleep feeling like her "blood was boiling" along with chest and left arm pain.  Patient also reports short episode of delirium.  Patient denies fever, recently diagnosed with PNA and complete abx course.

## 2023-07-02 ENCOUNTER — Encounter: Payer: Self-pay | Admitting: Neurology

## 2023-07-02 ENCOUNTER — Encounter: Payer: Self-pay | Admitting: Physical Therapy

## 2023-07-02 ENCOUNTER — Ambulatory Visit: Payer: 59 | Admitting: Physical Therapy

## 2023-07-02 VITALS — BP 140/70 | HR 76

## 2023-07-02 DIAGNOSIS — M792 Neuralgia and neuritis, unspecified: Secondary | ICD-10-CM

## 2023-07-02 DIAGNOSIS — R252 Cramp and spasm: Secondary | ICD-10-CM

## 2023-07-02 DIAGNOSIS — M79602 Pain in left arm: Secondary | ICD-10-CM

## 2023-07-02 DIAGNOSIS — G43009 Migraine without aura, not intractable, without status migrainosus: Secondary | ICD-10-CM

## 2023-07-02 DIAGNOSIS — M6281 Muscle weakness (generalized): Secondary | ICD-10-CM

## 2023-07-02 DIAGNOSIS — M542 Cervicalgia: Secondary | ICD-10-CM

## 2023-07-02 DIAGNOSIS — M5414 Radiculopathy, thoracic region: Secondary | ICD-10-CM

## 2023-07-02 DIAGNOSIS — M5481 Occipital neuralgia: Secondary | ICD-10-CM

## 2023-07-02 NOTE — Therapy (Signed)
OUTPATIENT PHYSICAL THERAPY CERVICAL TREATMENT - DISCHARGE SUMMARY   Patient Name: Sheri Johnson MRN: [id number] DOB:1985-12-29, 37 y.o., female Today's Date: 07/02/2023  PHYSICAL THERAPY DISCHARGE SUMMARY  Visits from Start of Care: 8  Current functional level related to goals / functional outcomes: See clinical impression statement.   Remaining deficits: Cervical and LUE pain, migraines, tremors   Education / Equipment: Medical follow-ups for persistent symptoms especially neurosurgery and possible sleep study/ EEG.  Progress towards goals and plateau with PT indicating discharge this visit.   Patient agrees to discharge. Patient goals were partially met. Patient is being discharged due to maximized rehab potential.    END OF SESSION:  PT End of Session - 07/02/23 1343     Visit Number 8    Number of Visits 9   8 + eval   Date for PT Re-Evaluation 07/06/23   pushed out due to scheduling conflicts for patient.   Authorization Type [employer]    PT Start Time 1324    PT Stop Time 1404    PT Time Calculation (min) 40 min    Activity Tolerance Patient tolerated treatment well    Behavior During Therapy [facility] for tasks assessed/performed               Past Medical History:  Diagnosis Date   Abortion 11/14/2021   Anxiety    Asthma    Frequent headaches    Hypertension    Hypothyroid    SVT (supraventricular tachycardia) (HCC)    Past Surgical History:  Procedure Laterality Date   CESAREAN SECTION     x2   LAPAROSCOPIC APPENDECTOMY N/A 12/14/2016   Procedure: APPENDECTOMY LAPAROSCOPIC;  Surgeon: Griselda Miner, MD;  Location: [facility] OR;  Service: General;  Laterality: N/A;   SVT ABLATION N/A 03/31/2022   Procedure: SVT ABLATION;  Surgeon: Marinus Maw, MD;  Location: [facility] INVASIVE CV LAB;  Service: Cardiovascular;  Laterality: N/A;   Patient Active Problem List   Diagnosis Date Noted   Neck pain 03/14/2023   Paresthesia 03/14/2023   Sciatica associated with  disorder of lumbar spine 03/14/2023   Migraine without aura and without status migrainosus, not intractable 03/14/2023   Witnessed episode of apnea 01/16/2023   Hypertension 12/14/2021   Palpitations 12/14/2021   Chest pain 12/14/2021   SVT (supraventricular tachycardia) (HCC) 12/13/2021   Acute appendicitis 12/14/2016   Appendicitis 12/14/2016    PCP: Porfirio Oar, PA  REFERRING PROVIDER: Levert Feinstein, MD  REFERRING DIAG: [phone] (ICD-10-CM) - Sciatica associated with disorder of lumbar spine M54.2 (ICD-10-CM) - Neck pain R20.2 (ICD-10-CM) - Paresthesia  THERAPY DIAG:  Pain in left arm  Bilateral occipital neuralgia  Neuralgia and neuritis  Muscle weakness (generalized)  Cramp and spasm  Cervicalgia  Radiculopathy, thoracic region  Rationale for Evaluation and Treatment: Rehabilitation  ONSET DATE: 2.5 years  SUBJECTIVE:  SUBJECTIVE STATEMENT: Patient states she is having more arm and neck pain today.  When she turns her head to the left it shots down arm and makes migraine worse.  She is having more tremors in the left hand/arm today (visible/palpable to PT though mild and mostly concentrated in forefinger and middle finger).  She is waiting for neurosurgery to call her, but states she has the number to follow-up as needed.  She states her symptoms seem to correlate with her menstrual cycle and she is wondering if she has a fluid or electrolyte imbalance.   Hand dominance: Right  PERTINENT HISTORY:  Hx of SVT and chest pain, sciatica, migraines  PAIN:  Are you having pain? Yes: NPRS scale: 7/10 Pain location: left face and neck, jaw, and forearm Pain description: "electrocution", tender, tense, twitching/spasm Aggravating factors: clenching jaw, staring at a screen,  sitting too long, chewing too quickly, not wearing mouth guard Relieving factors: tension ball, ice  PRECAUTIONS: None  RED FLAGS: Bowel or bladder incontinence: Yes: recent urge incontinence - GI aware (internal ultrasound scheduled for next month)     WEIGHT BEARING RESTRICTIONS: No  FALLS:  Has patient fallen in last 6 months? No  LIVING ENVIRONMENT: Lives with: lives with their family Lives in: House/apartment Stairs: Yes: Internal: 15-16 steps; can reach both Has following equipment at home: None  OCCUPATION: accounting - desk work (recently Scientist, product/process development)  PLOF: Independent  PATIENT GOALS: "Just to feel better, I want more permanent relief."  "A big goal of mine is to get off the prescription pain and anxiety meds."  NEXT MD VISIT: Bilateral C4/C5 and C5/C6 radiofrequency ablation scheduled 9/26 w/ Wynonia Lawman R  OBJECTIVE:   DIAGNOSTIC FINDINGS:  NCV w/ EMG WNL on 03/14/2023  PATIENT SURVEYS:  NDI 25/50 = moderate disability rating LEFS 28/80 = high impaired LE function; pt notes many tasks as rated on form are difficult due to UE NOT LE pain  COGNITION: Overall cognitive status: Within functional limits for tasks assessed  SENSATION: Light touch: WFL and pt reports less sensation on left  POSTURE: rounded shoulders and forward head  PALPATION: Patient is tender over C7 and cervicothoracic junction.   CERVICAL ROM:   Active ROM A/PROM (deg) eval  Flexion WNL; painful  Extension WNL  Right lateral flexion "  Left lateral flexion "  Right rotation "  Left rotation "   (Blank rows = not tested)  UPPER EXTREMITY ROM:  Active ROM Right eval Left eval  Shoulder flexion [facility]  Shoulder extension   Shoulder abduction   Shoulder adduction   Shoulder extension   Shoulder internal rotation   Shoulder external rotation   Elbow flexion   Elbow extension   Wrist flexion   Wrist extension   Wrist ulnar deviation    Wrist radial deviation     Wrist pronation    Wrist supination     (Blank rows = not tested)  UPPER EXTREMITY MMT:  MMT Right eval Left eval  Shoulder flexion 4/5 4-/5  Shoulder extension    Shoulder abduction 4+/5 4+/5  Shoulder adduction    Shoulder extension    Shoulder internal rotation    Shoulder external rotation    Middle trapezius    Lower trapezius    Elbow flexion 5/5 4+/5  Elbow extension    Wrist flexion    Wrist extension    Wrist ulnar deviation    Wrist radial deviation    Wrist pronation  Wrist supination    Grip strength TBD TBD   (Blank rows = not tested)  CERVICAL SPECIAL TESTS:  Upper limb tension test (ULTT): To be assessed (mainly LUE) and Distraction test: Positive - pt uses a traction unit at home.  FUNCTIONAL TESTS:  5 times sit to stand: 14.47 seconds no UE support  TODAY'S TREATMENT:                                                                                                                              DATE: 07/02/2023 -Confirmed pt walking 5 days per week and compliant to exercise when not in severe pain. -Discussed following up with rheumatology (has appt scheduled) vs gynecologist vs PCP vs neurologist for ongoing cluster of symptoms.  Discussed possibility of symptoms being unrelated vs caused by varying things and why follow-ups could be helpful for patient's differential diagnosis.  Discussed possibility of discharging until pt sees neurosurgery, encouraged her to follow up with office.  Provided therapeutic listening and encouragement. -NDI:  17/50 = 34% -LEFS:  38/80  PATIENT EDUCATION:  Education details:  Medical follow-ups for persistent symptoms especially neurosurgery and possible sleep study/ EEG.  Progress towards goals and plateau with PT indicating discharge this visit. Person educated: Patient Education method: Explanation Education comprehension: verbalized understanding and needs further education  HOME EXERCISE PROGRAM: Continue 15 minute  walks.  Access Code: [email or web address] URL: [email or web address] Date: 05/28/2023 Prepared by: Camille Bal  Exercises - Tip Pinch with Putty  - 1 x daily - 7 x weekly - 1 sets - 10 reps - Key Pinch with Putty  - 1 x daily - 7 x weekly - 1 sets - 10 reps - 3-Point Pinch with Putty  - 1 x daily - 7 x weekly - 1 sets - 10 reps - Seated Finger Extension with Putty  - 1 x daily - 7 x weekly - 1 sets - 10 reps - Seated Finger Composite Flexion with Putty  - 1 x daily - 7 x weekly - 1 sets - 10 reps - Sit to Stand with Arms Crossed  - 1 x daily - 7 x weekly - 2 sets - 10 reps - Supine Scapular Protraction in Flexion with Dumbbells  - 1 x daily - 4-5 x weekly - 2-3 sets - 10 reps - Supine Cervical Retraction with Towel  - 1 x daily - 7 x weekly - 1-2 sets - 10 reps - 2 seconds hold - Supine Chin Tuck  - 1 x daily - 7 x weekly - 2 sets - 10 reps - Open Book Chest Stretch on Towel Roll  - 1 x daily - 7 x weekly - 1 sets - 2 reps - 1 minutes hold - Prone Scapular Retraction Arms at Side  - 1 x daily - 7 x weekly - 2-3 sets - 10 reps  ASSESSMENT:  CLINICAL IMPRESSION: Patient has had a recent resurgence of cervical pain and LUE tremoring  noted today.  She denies change in activity, but associates some symptoms to her menstrual cycle.  PT encouraged medical follow-ups as appropriate due to fluctuation in benefits of PT this episode of care compared to prior.  She did meet 3 of 4 goals as written with NDI and LEFS showing significant improvement compared to prior assessment.  However, her scores continue to indicate a fair to moderate impairment in function.  She is attempting to stay active with yoga, her HEP, and walking program as her pain allows.  PT planning to discharge this visit to allow for self-management and pt to pursue alternative consultation for deficits outside PT scope of practice.  OBJECTIVE IMPAIRMENTS: decreased activity tolerance, decreased knowledge of condition,  decreased strength, impaired flexibility, improper body mechanics, postural dysfunction, and pain.   ACTIVITY LIMITATIONS: carrying, lifting, bending, squatting, reach over head, and locomotion level  PARTICIPATION LIMITATIONS: meal prep, cleaning, laundry, and occupation  PERSONAL FACTORS: Fitness, Past/current experiences, Sex, Time since onset of injury/illness/exacerbation, and 1-2 comorbidities: migraines  are also affecting patient's functional outcome.   REHAB POTENTIAL: Good  CLINICAL DECISION MAKING: Evolving/moderate complexity  EVALUATION COMPLEXITY: Moderate   GOALS: Goals reviewed with patient? Yes  SHORT TERM GOALS: Target date: 05/25/2023  Pt will be independent and compliant with initial mobility and pain management focused HEP in order to maintain functional progress and improve mobility. Baseline: Pt compliant to current copy (10/28) Goal status: MET  2.  Pt will decrease 5xSTS to </=12 seconds w/o UE use in order to demonstrate decreased risk for falls and improved functional bilateral LE strength and power. Baseline: 14.47 seconds no UE support; 14.10 seconds no UE support (10/28) Goal status: IN PROGRESS  LONG TERM GOALS: Target date: 06/22/2023  Pt will be independent and compliant with finalized mobility and pain management focused HEP in order to maintain functional progress and improve mobility. Baseline: Compliance limited by medical setbacks (11/25) Goal status: PARTIALLY MET  2.  Grip strength to be assessed with goal set as appropriate. Baseline:  No significant difference, subjective feeling of weakness, HEP to address. Goal status: REVISED - D/C'd 10/17  3.  ULTT to be assessed w/ goal set as appropriate. Baseline:  Not needed. Goal status: REVISED - D/C'd 10/17  4.  Patient will adhere to walking program >/=3 days a week in order to improve activity tolerance and global mobility. Baseline:  5 days per report (11/25) Goal status: MET  5.   Patient will improve NDI score to </=20/50 in order to demonstrate improved neck pain and improved quality of life. Baseline: 25/50; 17/50 (11/25) Goal status:  MET  6.  Patient will improve LEFS score to >/=37/80 in order to demonstrate improved LE function and mobility. Baseline: 28/80; 38/80 (11/25) Goal status: MET  PLAN:  PT FREQUENCY: 1x/week  PT DURATION: 8 weeks  PLANNED INTERVENTIONS: Therapeutic exercises, Therapeutic activity, Neuromuscular re-education, Balance training, Gait training, Patient/Family education, Self Care, Joint mobilization, Stair training, Vestibular training, Dry Needling, Electrical stimulation, Spinal mobilization, Moist heat, Taping, Traction, Manual therapy, and Re-evaluation  PLAN FOR NEXT SESSION: N/A  Sadie Haber, PT, DPT  07/02/2023, 2:04 PM

## 2023-07-03 NOTE — Telephone Encounter (Signed)
Let's follow through with plan for EEG first. Dr. Terrace Arabia may review need for tilt table test upon her return.

## 2023-07-04 NOTE — Telephone Encounter (Signed)
Pt has called to schedule her EEG, she is also on wait list, this is FYI no call back requested.

## 2023-07-09 ENCOUNTER — Encounter: Payer: Self-pay | Admitting: Cardiology

## 2023-07-10 NOTE — Telephone Encounter (Signed)
Spoke with pt, Aware of dr Ludwig Clarks recommendations. She reports she is trying to figure out what is going on with her. She is having severe headaches and she gets the feeling of dying every night in her sleep which causes her to wake with heart pounding and sweating. She feels like she may have POTS and was wondering if the tilt table testing could help diagnosis that. Explained that the tilt table testing is not really done anymore and POTS can be diagnosed by orthostatic vitals. She reports her problems are progressing and is trying to figure out what is going on. Encouraged her to stay well and liberalize salt intake to help with her blood volume. Reassurance given as her calcium score was 0 and her echo was normal.

## 2023-07-20 ENCOUNTER — Encounter: Payer: Self-pay | Admitting: Emergency Medicine

## 2023-07-20 ENCOUNTER — Ambulatory Visit
Admission: EM | Admit: 2023-07-20 | Discharge: 2023-07-20 | Disposition: A | Discharge status: Home / Self Care | Payer: 59 | Attending: Internal Medicine | Admitting: Internal Medicine

## 2023-07-20 ENCOUNTER — Other Ambulatory Visit: Payer: Self-pay

## 2023-07-20 DIAGNOSIS — R0602 Shortness of breath: Secondary | ICD-10-CM | POA: Diagnosis not present

## 2023-07-20 DIAGNOSIS — R1012 Left upper quadrant pain: Secondary | ICD-10-CM | POA: Diagnosis not present

## 2023-07-20 HISTORY — DX: Autoimmune thyroiditis: E06.3

## 2023-07-20 NOTE — ED Triage Notes (Addendum)
Patient's presents to [facility] for evaluation of left sided pain and swelling x 3 days.  Hx of recent pneumonia that resolved. She says she felt swollen this morning but when she laid down to get eyebrows down and felt short of breath.  Shortness of breath got better with sitting up but pain and swelling to left abdomen and flank worsened.  Now feels swelling up to neck, chest pressure, ear pressure.  She says she has a hx of heart stuff, and her heart rate has been fine when checking.  Turning to left make the pain worse.  Hx Hashimoto's, recent blood work that "diagnosed" lupus, but still needs to speak to a doctor.  States she has been peeing a lot.  Patient was seen by her PCP earlier this weekl, given abx for possible diverticulitis but she did not start them because she felt that was wrong, so she did not start them.  It was Augmentin

## 2023-07-20 NOTE — Discharge Instructions (Signed)
My medical recommendation is that you go to the emergency room as soon as possible for further workup of your symptoms

## 2023-07-20 NOTE — ED Provider Notes (Signed)
[facility]    CSN: [id number] Arrival date & time: 07/20/23  1644      History   Chief Complaint Chief Complaint  Patient presents with   Shortness of Breath   Pain    HPI Sheri Johnson is a 37 y.o. female presents for evaluation of abdominal pain and shortness of breath.  Patient has a complex medical history including hypertension, SVT, and newly diagnosed lupus who presents with worsening left upper abdominal pain.  Patient reports she has been having lower abdominal pain since last week and did a telehealth visit with her PCP on 12/10.  She has a history of diverticulitis and they felt it was similar so he prescribed her Augmentin.  Patient has not started this as her symptoms progressed and she did not feel it was related to diverticulitis.  She states the pain is migrated of across her lower abdomen of the left side of her abdomen to her left upper quadrant.  She states it is like a pressure and she feels swollen.  She reports some shortness of breath when she laid flat today getting her eyebrows done.  States the pain does wrap around to her back.  She also endorses some left ear pain and left neck pain that radiates down into her chest.  She did have 1 episode of dizziness after getting up from getting her eyebrows done.  No syncope, palpitations.  She was treated for pneumonia in the beginning of November and had a chest x-ray November 23 that showed resolution of the infection.  No upper respiratory symptoms.  No fevers.  Does report she is having urinary frequency but denies any nausea/vomiting or flank pain.  She has a history of appendectomy and C-section but otherwise no abdominal surgeries.  She is eating and drinking normally and had a bowel movement today that was normal.  No other concerns at this time.   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/2023   Migraine without aura and without status migrainosus, not intractable 03/14/2023   Witnessed episode of apnea 01/16/2023   Hypertension 12/14/2021   Palpitations 12/14/2021   Chest pain 12/14/2021   SVT (supraventricular tachycardia) (HCC) 12/13/2021   Acute appendicitis 12/14/2016   Appendicitis 12/14/2016    Past Surgical History:  Procedure Laterality Date   CESAREAN SECTION     x2   LAPAROSCOPIC APPENDECTOMY N/A 12/14/2016   Procedure: APPENDECTOMY LAPAROSCOPIC;  Surgeon: Griselda Miner, MD;  Location: [facility] OR;  Service: General;  Laterality: N/A;   SVT ABLATION N/A 03/31/2022   Procedure: SVT ABLATION;  Surgeon: Marinus Maw, MD;  Location: [facility];  Service: Cardiovascular;  Laterality: N/A;    OB History   No obstetric history on file.      Home Medications    Prior to Admission medications   Medication Sig Start Date End Date Taking? Authorizing Provider  acetaminophen (TYLENOL) 325 MG tablet Take 2 tablets (650 mg total) by mouth every 6 (six) hours as needed. 06/30/23   Tanda Rockers  A, DO  albuterol (VENTOLIN HFA) 108 (90 Base) MCG/ACT inhaler Inhale 1-2 puffs into the lungs every 6 (six) hours as needed for wheezing or shortness of breath. 06/05/23   Radford Pax, NP  ALPRAZolam (XANAX XR) 0.5 MG 24 hr tablet Take by mouth. 07/25/22   [provider]  ALPRAZolam (XANAX XR) 1 MG 24 hr tablet Take 1 mg by mouth as needed.    [provider]  Ascorbic Acid (VITAMIN C) 1000 MG tablet Take 1,000 mg by mouth daily.    [provider]  atenolol (TENORMIN) 25 MG tablet Take 1 tablet (25 mg total) by mouth 2 (two) times daily. 07/27/22   Azalee Course, PA  b  complex vitamins capsule Take 1 capsule by mouth daily.    [provider]  cetirizine (ZYRTEC) 10 MG tablet Take 10 mg by mouth 2 (two) times daily.    [provider]  clobetasol (TEMOVATE) 0.05 % external solution Apply 1 Application topically at bedtime. Apply to scalp 02/27/22   [provider]  Digestive Enzymes CAPS Take 1 capsule by mouth 2 (two) times daily before a meal.    [provider]  DULoxetine (CYMBALTA) 30 MG capsule Take 1 capsule (30 mg total) by mouth daily. 06/26/23   Levert Feinstein, MD  ferrous sulfate 325 (65 FE) MG EC tablet Take 1 tablet (325 mg total) by mouth daily with breakfast. 10/11/22   Ocie Doyne, MD  fluticasone (FLONASE) 50 MCG/ACT nasal spray Place 1 spray into both nostrils daily as needed for allergies or rhinitis.    [provider]  gabapentin (NEURONTIN) 300 MG capsule Take 300 mg by mouth as needed. 01/11/23   [provider]  ibuprofen (ADVIL) 600 MG tablet Take 1 tablet (600 mg total) by mouth every 6 (six) hours as needed. 06/30/23   Sloan Leiter, DO  levothyroxine (SYNTHROID) 100 MCG tablet Take 100 mcg by mouth every morning.    [provider]  lidocaine (LIDODERM) 5 % Place 1 patch onto the skin daily as needed. Remove & Discard patch within 12 hours or as directed by MD 06/30/23   Sloan Leiter, DO  Magnesium 400 MG CAPS Take 400 mg by mouth daily as needed (Low Magnesium).    [provider]  omeprazole (PRILOSEC) 40 MG capsule Take 40 mg by mouth daily. 01/22/23   [provider]  OVER THE COUNTER MEDICATION Take 2 tablets by mouth daily. Medication: Beet Root Supplement    [provider]  potassium chloride SA (KLOR-CON M) 20 MEQ tablet Take 1 tablet (20 mEq total) by mouth daily. 12/14/21   Tereso Newcomer T, PA-C  Probiotic Product (PROBIOTIC DAILY PO) Take 2 each by mouth at bedtime.    [provider]  QVAR REDIHALER 40 MCG/ACT inhaler Inhale 2  puffs into the lungs at bedtime. 01/15/23   [provider]  TURMERIC CURCUMIN PO Take 2 capsules by mouth daily.    [provider]    Family History Family History  Problem Relation Age of Onset   Hypertension Mother    Hyperthyroidism Mother     Social History Social History   Tobacco Use   Smoking status: Never   Smokeless tobacco: Never  Vaping Use   Vaping status: Never Used  Substance Use Topics   Alcohol use: Not Currently    Comment: Rare   Drug use: Never     Allergies   Metoprolol, Cephalexin, Cipro [ciprofloxacin hcl],  Prednisone, and Sulfamethoxazole-trimethoprim   Review of Systems Review of Systems  Respiratory:  Positive for shortness of breath.   Gastrointestinal:  Positive for abdominal distention and abdominal pain.     Physical Exam Triage Vital Signs ED Triage Vitals  Encounter Vitals Group     BP 07/20/23 1707 134/86     Systolic BP Percentile --      Diastolic BP Percentile --      Pulse Rate 07/20/23 1707 85     Resp 07/20/23 1707 20     Temp 07/20/23 1707 98.8 F (37.1 C)     Temp Source 07/20/23 1707 Oral     SpO2 07/20/23 1707 97 %     Weight --      Height --      Head Circumference --      Peak Flow --      Pain Score 07/20/23 1716 7     Pain Loc --      Pain Education --      Exclude from Growth Chart --    No data found.  Updated Vital Signs BP 134/86 (BP Location: Left Arm)   Pulse 85   Temp 98.8 F (37.1 C) (Oral)   Resp 20   LMP 06/25/2023 (Exact Date)   SpO2 97%   Visual Acuity Right Eye Distance:   Left Eye Distance:   Bilateral Distance:    Right Eye Near:   Left Eye Near:    Bilateral Near:     Physical Exam Vitals and nursing note reviewed.  Constitutional:      General: She is not in acute distress.    Appearance: Normal appearance. She is not ill-appearing, toxic-appearing or diaphoretic.  HENT:     Head: Normocephalic and atraumatic.  Eyes:     Pupils: Pupils are equal,  round, and reactive to light.  Cardiovascular:     Rate and Rhythm: Normal rate and regular rhythm.     Heart sounds: Normal heart sounds.  Pulmonary:     Effort: Pulmonary effort is normal. No respiratory distress.     Breath sounds: Normal breath sounds. No stridor. No wheezing or rhonchi.  Abdominal:     General: Bowel sounds are normal.     Palpations: Abdomen is soft. There is no fluid wave, hepatomegaly or splenomegaly.     Tenderness: Negative signs include McBurney's sign.     Comments: Abdomen is obese.  She does have generalized abdominal pain that is worse in the left upper and left lower quadrant.  Skin:    General: Skin is warm and dry.  Neurological:     General: No focal deficit present.     Mental Status: She is alert and oriented to person, place, and time.  Psychiatric:        Mood and Affect: Mood normal.        Behavior: Behavior normal.      UC Treatments / Results  Labs (all labs ordered are listed, but only abnormal results are displayed) Labs Reviewed - No data to display  EKG   Radiology No results found.  Procedures Procedures (including critical care time)  Medications Ordered in UC Medications - No data to display  Initial Impression / Assessment and Plan / UC Course  I have reviewed the triage vital signs and the nursing notes.  Pertinent labs & imaging results that were available during my care of the patient were reviewed by me and considered in my medical decision making (  see chart for details).     Reviewed exam and symptoms with patient.  Discussed limitations and abilities of urgent care.  Given her symptoms I advise she go to the emergency room for further workup/imaging.  Patient declines at this time stating she will go for symptoms get worse.  I reinforced need for emergent evaluation given her abdominal pain and shortness of breath and advised I am unable to workup the symptoms in this setting.  Patient verbalized understanding  again but states she will monitor her symptoms and go if they worsen.  She was discharged in stable condition with stable vital signs and in no acute distress. Final Clinical Impressions(s) / UC Diagnoses   Final diagnoses:  LUQ abdominal pain  Shortness of breath     Discharge Instructions      My medical recommendation is that you go to the emergency room as soon as possible for further workup of your symptoms    ED Prescriptions   None    PDMP not reviewed this encounter.   Radford Pax, NP 07/20/23 1740

## 2023-07-21 ENCOUNTER — Emergency Department (HOSPITAL_BASED_OUTPATIENT_CLINIC_OR_DEPARTMENT_OTHER)
Admission: EM | Admit: 2023-07-21 | Discharge: 2023-07-21 | Disposition: A | Discharge status: Home / Self Care | Payer: 59 | Attending: Emergency Medicine | Admitting: Emergency Medicine

## 2023-07-21 ENCOUNTER — Encounter (HOSPITAL_BASED_OUTPATIENT_CLINIC_OR_DEPARTMENT_OTHER): Payer: Self-pay

## 2023-07-21 ENCOUNTER — Encounter: Payer: Self-pay | Admitting: Cardiology

## 2023-07-21 ENCOUNTER — Emergency Department (HOSPITAL_BASED_OUTPATIENT_CLINIC_OR_DEPARTMENT_OTHER): Payer: 59

## 2023-07-21 DIAGNOSIS — J45909 Unspecified asthma, uncomplicated: Secondary | ICD-10-CM | POA: Insufficient documentation

## 2023-07-21 DIAGNOSIS — Z7951 Long term (current) use of inhaled steroids: Secondary | ICD-10-CM | POA: Insufficient documentation

## 2023-07-21 DIAGNOSIS — Z79899 Other long term (current) drug therapy: Secondary | ICD-10-CM | POA: Diagnosis not present

## 2023-07-21 DIAGNOSIS — R1032 Left lower quadrant pain: Secondary | ICD-10-CM

## 2023-07-21 DIAGNOSIS — I471 Supraventricular tachycardia, unspecified: Secondary | ICD-10-CM | POA: Diagnosis not present

## 2023-07-21 DIAGNOSIS — R0602 Shortness of breath: Secondary | ICD-10-CM | POA: Diagnosis present

## 2023-07-21 DIAGNOSIS — I10 Essential (primary) hypertension: Secondary | ICD-10-CM | POA: Insufficient documentation

## 2023-07-21 DIAGNOSIS — E039 Hypothyroidism, unspecified: Secondary | ICD-10-CM | POA: Diagnosis not present

## 2023-07-21 DIAGNOSIS — R519 Headache, unspecified: Secondary | ICD-10-CM

## 2023-07-21 DIAGNOSIS — R0789 Other chest pain: Secondary | ICD-10-CM

## 2023-07-21 HISTORY — DX: Systemic lupus erythematosus, unspecified: M32.9

## 2023-07-21 LAB — CBC
HCT: 43.4 % (ref 36.0–46.0)
Hemoglobin: 14.7 g/dL (ref 12.0–15.0)
MCH: 28.6 pg (ref 26.0–34.0)
MCHC: 33.9 g/dL (ref 30.0–36.0)
MCV: 84.4 fL (ref 80.0–100.0)
Platelets: 278 10*3/uL (ref 150–400)
RBC: 5.14 MIL/uL — ABNORMAL HIGH (ref 3.87–5.11)
RDW: 12.9 % (ref 11.5–15.5)
WBC: 7.1 10*3/uL (ref 4.0–10.5)
nRBC: 0 % (ref 0.0–0.2)

## 2023-07-21 LAB — TSH: TSH: 8.905 u[IU]/mL — ABNORMAL HIGH (ref 0.350–4.500)

## 2023-07-21 LAB — HEPATIC FUNCTION PANEL
ALT: 24 U/L (ref 0–44)
AST: 20 U/L (ref 15–41)
Albumin: 4.5 g/dL (ref 3.5–5.0)
Alkaline Phosphatase: 38 U/L (ref 38–126)
Bilirubin, Direct: 0.2 mg/dL (ref 0.0–0.2)
Indirect Bilirubin: 0.6 mg/dL (ref 0.3–0.9)
Total Bilirubin: 0.8 mg/dL (ref <1.2)
Total Protein: 7.6 g/dL (ref 6.5–8.1)

## 2023-07-21 LAB — BASIC METABOLIC PANEL
Anion gap: 9 (ref 5–15)
BUN: 11 mg/dL (ref 6–20)
CO2: 22 mmol/L (ref 22–32)
Calcium: 9 mg/dL (ref 8.9–10.3)
Chloride: 105 mmol/L (ref 98–111)
Creatinine, Ser: 1.01 mg/dL — ABNORMAL HIGH (ref 0.44–1.00)
GFR, Estimated: >60 mL/min (ref >60)
Glucose, Bld: 113 mg/dL — ABNORMAL HIGH (ref 70–99)
Potassium: 3.6 mmol/L (ref 3.5–5.1)
Sodium: 136 mmol/L (ref 135–145)

## 2023-07-21 LAB — TROPONIN I (HIGH SENSITIVITY): Troponin I (High Sensitivity): <2 ng/L (ref <18)

## 2023-07-21 LAB — MAGNESIUM: Magnesium: 1.9 mg/dL (ref 1.7–2.4)

## 2023-07-21 LAB — HCG, SERUM, QUALITATIVE: Preg, Serum: NEGATIVE

## 2023-07-21 MED ORDER — SODIUM CHLORIDE 0.9 % IV BOLUS
1000.0000 mL | Freq: Once | INTRAVENOUS | Status: AC
  Administered 2023-07-21: 1000 mL via INTRAVENOUS

## 2023-07-21 MED ORDER — DIPHENHYDRAMINE HCL 50 MG/ML IJ SOLN
12.5000 mg | Freq: Once | INTRAMUSCULAR | Status: AC
  Administered 2023-07-21: 12.5 mg via INTRAVENOUS
  Filled 2023-07-21: qty 1

## 2023-07-21 MED ORDER — KETOROLAC TROMETHAMINE 30 MG/ML IJ SOLN
30.0000 mg | Freq: Once | INTRAMUSCULAR | Status: AC
  Administered 2023-07-21: 30 mg via INTRAVENOUS
  Filled 2023-07-21: qty 1

## 2023-07-21 MED ORDER — METOCLOPRAMIDE HCL 5 MG/ML IJ SOLN
10.0000 mg | Freq: Once | INTRAMUSCULAR | Status: AC
  Administered 2023-07-21: 10 mg via INTRAVENOUS
  Filled 2023-07-21: qty 2

## 2023-07-21 MED ORDER — IOHEXOL 350 MG/ML SOLN
100.0000 mL | Freq: Once | INTRAVENOUS | Status: AC | PRN
  Administered 2023-07-21: 100 mL via INTRAVENOUS

## 2023-07-21 NOTE — ED Notes (Signed)
Pt left 2 silver hoop earrings on the side table. They were placed in a specimen cup and her name was written on the bottle. The pt was called at her mobile phone in the cart without success. VM was left at that time. Earrings were given to Security up front and pt was instructed to go to security in the ER to reclaim.

## 2023-07-21 NOTE — ED Notes (Signed)
Pt wanted to leave before fluids finished. Obliged her.

## 2023-07-21 NOTE — ED Provider Notes (Signed)
[place] [facility] Provider Note   CSN: [id number] Arrival date & time: 07/21/23  4098     History  Chief Complaint  Patient presents with   Chest Pain    Sheri Johnson is a 37 y.o. female.  Pt is a 37 yo with pmhx significant for SVT, htn, asthma, anxiety, hypothyroidism, and lupus.  Pt is here with multiple complaints.  She's had a headache for a month.  She did fall and hit her head.  Tylenol and ibuprofen is not helping.  She has had lower abd pain for a few weeks.  She had a televisit with her pcp on 12/10 and was told she may have diverticulitis, so was rx'd augmentin.  She never took the abx because she did not think it was diverticulitis.  [facility] and was told to come to the ED.  She was tired, so went home.   Pt had a positive ENA and ANA test in November.  She has been referred to rheumatology, but does not have that appt until Jan.  She is not sure if her sx are from possible Lupus or something else.  Pt has also had a prior ablation for SVT, but said she's been going into SVT frequently.  She had an episode this am which lasted about 12 minutes.  She is concerned there is something in her abdomen pushing on her heart.  She is also worried her heart is enlarged.  She has cp and sob now.         Home Medications Prior to Admission medications   Medication Sig Start Date End Date Taking? Authorizing Provider  acetaminophen (TYLENOL) 325 MG tablet Take 2 tablets (650 mg total) by mouth every 6 (six) hours as needed. 06/30/23   Sloan Leiter, DO  albuterol (VENTOLIN HFA) 108 (90 Base) MCG/ACT inhaler Inhale 1-2 puffs into the lungs every 6 (six) hours as needed for wheezing or shortness of breath. 06/05/23   Radford Pax, NP  ALPRAZolam (XANAX XR) 0.5 MG 24 hr tablet Take by mouth. 07/25/22   [provider]  ALPRAZolam (XANAX XR) 1 MG 24 hr tablet Take 1 mg by mouth as needed.    [provider]  Ascorbic Acid (VITAMIN C) 1000 MG tablet Take 1,000 mg by mouth daily.    [provider]  atenolol (TENORMIN) 25 MG tablet Take 1 tablet (25 mg total) by mouth 2 (two) times daily. 07/27/22   Azalee Course, PA  b complex vitamins capsule Take 1 capsule by mouth daily.    [provider]  cetirizine (ZYRTEC) 10 MG tablet Take 10 mg by mouth 2 (two) times daily.    [provider]  clobetasol (TEMOVATE) 0.05 % external solution Apply 1 Application topically at bedtime. Apply to scalp 02/27/22   [provider]  Digestive Enzymes CAPS Take 1 capsule by mouth 2 (two) times daily before a meal.    [provider]  DULoxetine (CYMBALTA) 30 MG capsule Take 1 capsule (30 mg total) by mouth daily. 06/26/23   Levert Feinstein, MD  ferrous sulfate 325 (65 FE) MG EC tablet Take 1 tablet (325 mg total) by mouth daily with breakfast. 10/11/22   Ocie Doyne, MD  fluticasone (FLONASE) 50 MCG/ACT nasal spray Place 1 spray into both nostrils daily as needed for allergies or rhinitis.    [provider]  gabapentin (NEURONTIN) 300 MG capsule Take  300 mg by mouth as needed. 01/11/23   [provider]  ibuprofen (ADVIL) 600 MG tablet Take 1 tablet (600 mg total) by mouth every 6 (six) hours as needed. 06/30/23   Sloan Leiter, DO  levothyroxine (SYNTHROID) 100 MCG tablet Take 100 mcg by mouth every morning.    [provider]  lidocaine (LIDODERM) 5 % Place 1 patch onto the skin daily as needed. Remove & Discard patch within 12 hours or as directed by MD 06/30/23   Sloan Leiter, DO  Magnesium 400 MG CAPS Take 400 mg by mouth daily as needed (Low Magnesium).    [provider]  omeprazole (PRILOSEC) 40 MG capsule Take 40 mg by mouth daily. 01/22/23   [provider]  OVER THE COUNTER MEDICATION Take 2 tablets by mouth daily. Medication: Beet Root Supplement    [provider]  potassium chloride SA (KLOR-CON M) 20  MEQ tablet Take 1 tablet (20 mEq total) by mouth daily. 12/14/21   Tereso Newcomer T, PA-C  Probiotic Product (PROBIOTIC DAILY PO) Take 2 each by mouth at bedtime.    [provider]  QVAR REDIHALER 40 MCG/ACT inhaler Inhale 2 puffs into the lungs at bedtime. 01/15/23   [provider]  TURMERIC CURCUMIN PO Take 2 capsules by mouth daily.    [provider]      Allergies    Metoprolol, Cephalexin, Cipro [ciprofloxacin hcl], Prednisone, and Sulfamethoxazole-trimethoprim    Review of Systems   Review of Systems  Respiratory:  Positive for shortness of breath.   Cardiovascular:  Positive for chest pain and palpitations.  Gastrointestinal:  Positive for abdominal pain.  Neurological:  Positive for headaches.  All other systems reviewed and are negative.   Physical Exam Updated Vital Signs BP 124/74   Pulse 97   Temp 98.7 F (37.1 C)   Resp (!) 24   Ht 5\' 9"  (1.753 m)   Wt 108 kg   LMP 06/25/2023 (Exact Date)   SpO2 93%   BMI 35.16 kg/m  Physical Exam Vitals and nursing note reviewed.  Constitutional:      Appearance: She is well-developed. She is obese.  HENT:     Head: Normocephalic and atraumatic.  Eyes:     Extraocular Movements: Extraocular movements intact.     Pupils: Pupils are equal, round, and reactive to light.  Cardiovascular:     Rate and Rhythm: Regular rhythm. Tachycardia present.     Heart sounds: Normal heart sounds.  Pulmonary:     Effort: Pulmonary effort is normal.     Breath sounds: Normal breath sounds.  Abdominal:     General: Bowel sounds are normal.     Palpations: Abdomen is soft.  Musculoskeletal:        General: Normal range of motion.     Cervical back: Normal range of motion and neck supple.  Skin:    General: Skin is warm.     Capillary Refill: Capillary refill takes less than 2 seconds.  Neurological:     General: No focal deficit present.     Mental Status: She is alert and oriented to person, place, and  time.  Psychiatric:        Mood and Affect: Mood is anxious.     ED Results / Procedures / Treatments   Labs (all labs ordered are listed, but only abnormal results are displayed) Labs Reviewed  BASIC METABOLIC PANEL - Abnormal; Notable for the following components:  Result Value   Glucose, Bld 113 (*)    Creatinine, Ser 1.01 (*)    All other components within normal limits  CBC - Abnormal; Notable for the following components:   RBC 5.14 (*)    All other components within normal limits  HCG, SERUM, QUALITATIVE  HEPATIC FUNCTION PANEL  MAGNESIUM  URINALYSIS, ROUTINE W REFLEX MICROSCOPIC  TSH  TROPONIN I (HIGH SENSITIVITY)  TROPONIN I (HIGH SENSITIVITY)    EKG EKG Interpretation Date/Time:  Saturday July 21 2023 09:21:06 EST Ventricular Rate:  105 PR Interval:  142 QRS Duration:  92 QT Interval:  354 QTC Calculation: 468 R Axis:   56  Text Interpretation: Sinus tachycardia Borderline T wave abnormalities Baseline wander in lead(s) III aVL aVF Since last tracing rate faster Confirmed by Jacalyn Lefevre [phone]) on 07/21/2023 10:13:45 AM  Radiology CT ABDOMEN PELVIS W CONTRAST Result Date: 07/21/2023 CLINICAL DATA:  37 year old female status post SVT. Headache. Left lower quadrant abdominal pain. EXAM: CT ABDOMEN AND PELVIS WITH CONTRAST TECHNIQUE: Multidetector CT imaging of the abdomen and pelvis was performed using the standard protocol following bolus administration of intravenous contrast. RADIATION DOSE REDUCTION: This exam was performed according to the departmental dose-optimization program which includes automated exposure control, adjustment of the mA and/or kV according to patient size and/or use of iterative reconstruction technique. CONTRAST:  OMNIPAQUE IOHEXOL 350 MG/ML SOLN COMPARISON:  CTA chest today.  CTA abdomen and pelvis 01/30/2022. FINDINGS: Lower chest: Mild lung base atelectasis as seen on CTA chest today. Hepatobiliary: Liver and  gallbladder are within normal limits. Pancreas: Negative. Spleen: Negative. Adrenals/Urinary Tract: Normal adrenal glands. Nonobstructed kidneys with symmetric renal enhancement. No evidence of nephrolithiasis or renal inflammation. 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 Decompressed ureters. Diminutive bladder is unremarkable. Incidental pelvic phleboliths, more numerous on the right. Stomach/Bowel: Occasional large bowel diverticula, including containing a fecalith at the hepatic flexure on series 306, image 72. No associated inflammation. Evidence of previous appendectomy. Decompressed terminal ileum. No dilated small bowel. Stomach and duodenum appear negative. No free air, free fluid, mesenteric inflammation identified. Vascular/Lymphatic: Normal caliber abdominal aorta. Suboptimal intravascular contrast timing but the major arterial structures and portal venous system appear to be patent. No calcified atherosclerosis or lymphadenopathy identified. Reproductive: Within normal limits, retroverted uterus. Other: Trace if any pelvis free fluid. Musculoskeletal: Lower lumbar exaggerated lordosis with facet degeneration. No acute osseous abnormality identified. IMPRESSION: 1. No acute or inflammatory process identified in the abdomen or pelvis. 2. Occasional large bowel diverticula, including at the hepatic flexure where a diverticula contains a fecalith. No active inflammation identified. Prior appendectomy. 3. CTA Chest reported separately. Electronically Signed   By: Odessa Fleming M.D.   On: 07/21/2023 10:47   CT Angio Chest PE W and/or Wo Contrast Result Date: 07/21/2023 CLINICAL DATA:  37 year old female status post SVT. Headache. Left lower quadrant abdominal pain. EXAM: CT ANGIOGRAPHY CHEST WITH CONTRAST TECHNIQUE: Multidetector CT imaging of the chest was performed using the standard protocol during bolus administration of intravenous contrast. Multiplanar  CT image reconstructions and MIPs were obtained to evaluate the vascular anatomy. RADIATION DOSE REDUCTION: This exam was performed according to the departmental dose-optimization program which includes automated exposure control, adjustment of the mA and/or kV according to patient size and/or use of iterative reconstruction technique. CONTRAST:  OMNIPAQUE IOHEXOL 350 MG/ML SOLN COMPARISON:  CTA chest 01/03/2023. CT Abdomen and Pelvis today reported separately. FINDINGS: Cardiovascular: Good contrast bolus timing in the pulmonary  arterial tree. No pulmonary artery filling defect. Stable heart size, upper limits of normal. No pericardial effusion. Thoracic aorta appears negative. No calcified coronary artery plaque is evident. Mediastinum/Nodes: Stable, negative. Lungs/Pleura: Mildly lower lung volumes. Symmetric dependent atelectasis. Major airways are patent. Lungs are otherwise clear. No pleural effusion. Upper Abdomen: CT Abdomen and Pelvis reported separately. Musculoskeletal: Thoracic endplate spurring. No acute osseous abnormality identified. Review of the MIP images confirms the above findings. IMPRESSION: 1. Negative for acute pulmonary embolus. No vascular abnormality identified on CTA Chest. 2. Lower lung volumes with mild atelectasis. 3. CT Abdomen and Pelvis today reported separately. Electronically Signed   By: Odessa Fleming M.D.   On: 07/21/2023 10:42   CT Head Wo Contrast Result Date: 07/21/2023 CLINICAL DATA:  37 year old female status post SVT. Headache. Left lower quadrant abdominal pain. EXAM: CT HEAD WITHOUT CONTRAST TECHNIQUE: Contiguous axial images were obtained from the base of the skull through the vertex without intravenous contrast. RADIATION DOSE REDUCTION: This exam was performed according to the departmental dose-optimization program which includes automated exposure control, adjustment of the mA and/or kV according to patient size and/or use of iterative reconstruction technique.  COMPARISON:  Brain MRI 08/05/2022.  Head CT 08/03/2022. FINDINGS: Brain: Normal cerebral volume. No midline shift, ventriculomegaly, mass effect, evidence of mass lesion, intracranial hemorrhage or evidence of cortically based acute infarction. Gray-white matter differentiation is within normal limits throughout the brain. Vascular: No suspicious intracranial vascular hyperdensity. Skull: Intact, negative. Sinuses/Orbits: Small right maxillary sinus mucous retention cyst unchanged. Otherwise visualized paranasal sinuses and mastoids are clear. Other: Visualized orbits and scalp soft tissues are within normal limits. IMPRESSION: Stable and negative noncontrast Head CT. Electronically Signed   By: Odessa Fleming M.D.   On: 07/21/2023 10:39    Procedures Procedures    Medications Ordered in ED Medications  sodium chloride 0.9 % bolus 1,000 mL (1,000 mLs Intravenous New Bag/Given 07/21/23 0944)  ketorolac (TORADOL) 30 MG/ML injection 30 mg (30 mg Intravenous Given 07/21/23 0943)  metoCLOPramide (REGLAN) injection 10 mg (10 mg Intravenous Given 07/21/23 0943)  diphenhydrAMINE (BENADRYL) injection 12.5 mg (12.5 mg Intravenous Given 07/21/23 0942)  iohexol (OMNIPAQUE) 350 MG/ML injection 100 mL (100 mLs Intravenous Contrast Given 07/21/23 1021)    ED Course/ Medical Decision Making/ A&P                                 Medical Decision Making Amount and/or Complexity of Data Reviewed Labs: ordered. Radiology: ordered.  Risk Prescription drug management.   This patient presents to the ED for concern of headache, cp, abd pain, this involves an extensive number of treatment options, and is a complaint that carries with it a high risk of complications and morbidity.  The differential diagnosis includes brain injury, electrolyte abn, anemia, diverticulitis, pregnancy, uti   Co morbidities that complicate the patient evaluation  SVT, htn, asthma, anxiety, hypothyroidism, and lupus   Additional  history obtained:  Additional history obtained from epic chart review  Lab Tests:  I Ordered, and personally interpreted labs.  The pertinent results include:  cbc nl, bmp nl, trop nl, preg neg   Imaging Studies ordered:  I ordered imaging studies including ct head, ct chest, ct abd/pelvis  I independently visualized and interpreted imaging which showed  CT head: Stable and negative noncontrast Head CT.  CT chest:  Negative for acute pulmonary embolus. No vascular abnormality  identified on CTA Chest.  2. Lower lung volumes with mild atelectasis.  3. CT Abdomen and Pelvis today reported separately.  CT abd/pelvis: No acute or inflammatory process identified in the abdomen or  pelvis.  2. Occasional large bowel diverticula, including at the hepatic  flexure where a diverticula contains a fecalith. No active  inflammation identified. Prior appendectomy.  3. CTA Chest reported separately.   I agree with the radiologist interpretation   Cardiac Monitoring:  The patient was maintained on a cardiac monitor.  I personally viewed and interpreted the cardiac monitored which showed an underlying rhythm of: st initially, but now nsr   Medicines ordered and prescription drug management:  I ordered medication including ivfs/toradol/reglan/benadryl  for sx  Reevaluation of the patient after these medicines showed that the patient improved I have reviewed the patients home medicines and have made adjustments as needed   Test Considered:  ct   Critical Interventions:  Pain control   Problem List / ED Course:  CP:  cardiac work up neg.  CT neg for PE.  HR down to the 70s at d/c.  Pt is to f/u with Dr. Jens Som. Abd pain:  labs nl.  Ct nl.  No etiology found.  If sx continue, she will need to f/u with pcp/gi. Headache:  ct neg.  Headache improved.  Pt has been under a lot of stress and not sleeping well.  I think those things are contributing to sx.   Reevaluation:  After  the interventions noted above, I reevaluated the patient and found that they have :improved   Social Determinants of Health:  Lives at home   Dispostion:  After consideration of the diagnostic results and the patients response to treatment, I feel that the patent would benefit from discharge with outpatient f/u.          Final Clinical Impression(s) / ED Diagnoses Final diagnoses:  SVT (supraventricular tachycardia) (HCC)  Left lower quadrant abdominal pain  Atypical chest pain  Acute nonintractable headache, unspecified headache type    Rx / DC Orders ED Discharge Orders     None         Jacalyn Lefevre, MD 07/21/23 1115

## 2023-07-21 NOTE — ED Notes (Signed)
Discharge paperwork reviewed entirely with patient, including follow up care. Pain was under control. No prescriptions were called in, but all questions were addressed.  Pt verbalized understanding as well as all parties involved. No questions or concerns voiced at the time of discharge. No acute distress noted.   Pt ambulated out to PVA without incident or assistance.  Pt advised they will seek followup care with a specialist and followup with their PCP.

## 2023-07-21 NOTE — ED Notes (Signed)
Due to hx of SVT, Paramedic to accompany the pt to CT.

## 2023-07-21 NOTE — ED Triage Notes (Signed)
The patient stated she had an episode of SVT at home today around 1 hour ago. She stated it lasted for around 12 minutes. She stated that has resolved at this time. She is having chest pain since then. She was seen at [facility] yesterday for abd pain and they wanted her to come here for a CT scan.

## 2023-07-23 NOTE — Telephone Encounter (Signed)
Spoke with pt about irregular heart activity message. Pt is not having current symptoms stated in message. Symptoms are present only during runs of SVT. Pt states arrhythmias increase before and during menstruation. Pt is concerned about SVT runs and PVCs becoming more frequent. Pt states she feels PVCs daily. Pt states she was recently diagnosed with Lupus and hasn't been checked for this diagnosis. I advised patient to monitor symptoms and runs of SVT. I informed pt I will fwd message to Dr. Jens Som to review and advise. Pt verbalized understanding.

## 2023-07-25 ENCOUNTER — Telehealth: Payer: Self-pay | Admitting: Neurology

## 2023-07-25 DIAGNOSIS — G43009 Migraine without aura, not intractable, without status migrainosus: Secondary | ICD-10-CM

## 2023-07-25 MED ORDER — TIZANIDINE HCL 4 MG PO TABS: 4.0000 mg | ORAL_TABLET | Freq: Four times a day (QID) | ORAL | 6 refills | Status: DC | PRN

## 2023-07-25 MED ORDER — ONDANSETRON HCL 4 MG PO TABS: 4.0000 mg | ORAL_TABLET | Freq: Three times a day (TID) | ORAL | 6 refills | Status: DC | PRN

## 2023-07-25 MED ORDER — RIZATRIPTAN BENZOATE 5 MG PO TBDP: ORAL_TABLET | ORAL | 6 refills | Status: DC

## 2023-07-25 NOTE — Telephone Encounter (Signed)
Returned call to patient who reports going to the ER last weekend with severe stomach pain and migraines. She says that she got a migraine cocktail in the ER that helped. She is wanting to know if we can prescribe the same thing or give her something for her migraines. She states that 1 week prior to getting her monthly cycle she gets bad migraines. She reports only taking tylenol and advil with no relief.  She reports having a fall last month with a head strike and did not go to the ER but states that her she has headache since then with worsening around her cycle time. Advised I would send to Dr. Terrace Arabia but will follow up tomorrow with her recommendations. Patient verbalized understanding and appreciative of call.

## 2023-07-25 NOTE — Telephone Encounter (Signed)
Returned call to patient, no answer. Left message to return call

## 2023-07-25 NOTE — Telephone Encounter (Signed)
Pt states she has been going to the ER for her migraines recently and they have been giving her a medication that seems to be helping. She would like to know if she can get a prescription for that medication however, she doesn't remember the name. Asking if the Dr can pull it up per her ER visits. Requesting call back to discuss

## 2023-07-25 NOTE — Addendum Note (Signed)
Addended by: Levert Feinstein on: 07/25/2023 06:57 PM   Modules accepted: Orders

## 2023-07-25 NOTE — Telephone Encounter (Signed)
Pt has returned call to RN, she is asking for a call back when RN is available.

## 2023-07-25 NOTE — Telephone Encounter (Signed)
Meds ordered this encounter  Medications   rizatriptan (MAXALT-MLT) 5 MG disintegrating tablet    Sig: May repeat in 2 hours if needed    Dispense:  10 tablet    Refill:  6   tiZANidine (ZANAFLEX) 4 MG tablet    Sig: Take 1 tablet (4 mg total) by mouth every 6 (six) hours as needed for muscle spasms.    Dispense:  30 tablet    Refill:  6   ondansetron (ZOFRAN) 4 MG tablet    Sig: Take 1 tablet (4 mg total) by mouth every 8 (eight) hours as needed for nausea or vomiting.    Dispense:  20 tablet    Refill:  6     I have written above for cocktail treatment, of course she does not have to use all of them at once, may start with Maxalt as needed, if needed, may add Aleve, Benadryl as muscle relaxant, Zofran for nausea, sleep usually help bad migraine

## 2023-08-06 ENCOUNTER — Ambulatory Visit: Payer: 59 | Admitting: Primary Care

## 2023-08-14 ENCOUNTER — Ambulatory Visit: Payer: 59 | Admitting: Neurology

## 2023-08-14 DIAGNOSIS — R4182 Altered mental status, unspecified: Secondary | ICD-10-CM | POA: Diagnosis not present

## 2023-08-14 DIAGNOSIS — G43009 Migraine without aura, not intractable, without status migrainosus: Secondary | ICD-10-CM

## 2023-08-14 DIAGNOSIS — R5383 Other fatigue: Secondary | ICD-10-CM

## 2023-08-15 ENCOUNTER — Telehealth: Payer: Self-pay | Admitting: Cardiology

## 2023-08-15 ENCOUNTER — Encounter: Payer: Self-pay | Admitting: Cardiology

## 2023-08-15 ENCOUNTER — Ambulatory Visit: Payer: 59 | Attending: Cardiology | Admitting: Cardiology

## 2023-08-15 VITALS — BP 128/86 | HR 80 | Ht 69.0 in | Wt 236.2 lb

## 2023-08-15 DIAGNOSIS — I4711 Inappropriate sinus tachycardia, so stated: Secondary | ICD-10-CM

## 2023-08-15 DIAGNOSIS — E876 Hypokalemia: Secondary | ICD-10-CM

## 2023-08-15 DIAGNOSIS — E039 Hypothyroidism, unspecified: Secondary | ICD-10-CM

## 2023-08-15 DIAGNOSIS — I471 Supraventricular tachycardia, unspecified: Secondary | ICD-10-CM | POA: Diagnosis not present

## 2023-08-15 DIAGNOSIS — I10 Essential (primary) hypertension: Secondary | ICD-10-CM | POA: Diagnosis not present

## 2023-08-15 MED ORDER — ATENOLOL 25 MG PO TABS: ORAL_TABLET | ORAL | 3 refills | Status: DC

## 2023-08-15 NOTE — Patient Instructions (Signed)
 Medication Instructions:  Increase Atenolol  to 50 mg (2 tablets) in the morning and 25 mg (1 tablet) at night *If you need a refill on your cardiac medications before your next appointment, please call your pharmacy*  Lab Work: Today we will draw a Bmet and Mag If you have labs (blood work) drawn today and your tests are completely normal, you will receive your results only by: MyChart Message (if you have MyChart) OR A paper copy in the mail If you have any lab test that is abnormal or we need to change your treatment, we will call you to review the results.  Testing/Procedures: No testing  Follow-Up: At [facility], you and your health needs are our priority.  As part of our continuing mission to provide you with exceptional heart care, we have created designated Provider Care Teams.  These Care Teams include your primary Cardiologist (physician) and Advanced Practice Providers (APPs -  Physician Assistants and Nurse Practitioners) who all work together to provide you with the care you need, when you need it.  We recommend signing up for the patient portal called MyChart.  Sign up information is provided on this After Visit Summary.  MyChart is used to connect with patients for Virtual Visits (Telemedicine).  Patients are able to view lab/test results, encounter notes, upcoming appointments, etc.  Non-urgent messages can be sent to your provider as well.   To learn more about what you can do with MyChart, go to [email or web address].    Your next appointment:   2 month(s)  Provider:   Redell Shallow, MD    Other Instructions Next available with Dr. [facility] EP clinic

## 2023-08-15 NOTE — Progress Notes (Signed)
 Cardiology Office Note    Date:  08/15/2023  ID:  Sheri Johnson, DOB August 29, 1985, MRN [id number] PCP:  Juliane Che, PA  Cardiologist:  Redell Shallow, MD  Electrophysiologist:  Danelle Birmingham, MD   Chief Complaint: Palpitations   History of Present Illness: .    Sheri Johnson is a 38 y.o. female with visit-pertinent history of SVT, hypertension, hypothyroidism, anxiety.  Patient had COVID in 08/2018 and subsequently evaluated for palpitations in 01/2021 with a ED visit with SVT, heart rate greater than 200 bpm with diffuse ST depression.  She had previously been treated for inappropriate sinus tachycardia treated with beta-blocker.  Echo in 01/2022 indicated normal LVEF.  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  Coronary CTA in 04/2022 with a coronary calcium  score of 0, dilated main pulmonary artery at 31 mm suggestive of pulmonary hypertension.  She was seen in 05/2022 with recommendations to gradually decrease atenolol  and utilize as needed.  ED visit on 06/2022 with chest pain radiating to the left arm and jaw as well as dizziness and palpitations.  She is left without being seen.  PFTs in 06/13/2022 were normal.  ED visit on 07/19/2022 with arm numbness and palpitations with EKG revealing sinus tach which improved with vagal maneuvers.  She was seen in clinic on 10/09/2022 for lower extremity edema, dyspnea.  Echo on 10/23/2022 indicated normal LVEF, no significant valvular abnormalities.  ABI 12/21/2022 with no evidence of claudication.   She was seen by Dr. Alveta on 11/17/2022 with chest and arm pain more consistent with musculoskeletal pain.  3-day event monitor placed due to palpitations, she wore for less than 24 hours due to scar and irritation.  She presented to the ED on 01/03/2023 with atypical chest pain with chest tenderness to the left upper chest.  CT with no PE and no visible coronary calcification.  No evidence of DVT by ultrasound.  She had normal  troponin x 2.  She was seen in clinic on 01/08/2023 by Reche Finder, NP with left-sided chest pain rating from neck down to shoulder, reassurance provided that CT showed no PE and no coronary atherosclerosis.  She was last seen in clinic by Dr. Shallow on 04/24/2023.  She complains of difficulty swallowing followed by chest pain, heart racing and dizziness.  She felt dyspneic when she lays down and also has heart pounding during these times.  She denied any episodes of syncope.  It was recommended that she continue her beta-blocker and continue to monitor via her Apple Watch.  On chart review patient presented to the ED on 06/30/2023 for atypical chest pain that have been ongoing for over 24 hours, high sensitive troponin was negative.  She recently had pneumonia.  She was given 2 g of magnesium  sulfate and potassium chloride  and discharged with recommended follow-up with her PCP.  She again presented to the ED on 07/21/2023 with multiple complaints of headache, episodes of SVT, fatigue and abdominal pain was felt by her PCP to be diverticulitis.  CT of chest was negative for acute PE, there was no acute inflammatory process in the abdomen or pelvis.  On 08/07/2023 she presented to [facility] following an episode of possible SVT, her heart rate was in the 170s.  She tried vagal maneuvers without improvement called EMS.  EMS was able to bring her heart rate down from the 170s to the 140s with vagal maneuvers, per patient report was sinus taychardia per EMS.    Today  she presents for increased palpitations, lightheadedness, tremors, headaches, and vision changes.  She reports that in the last few months she has noted when she first gets up in the morning her heart rate will be consistently elevated into the 140s and 150s, she notes that her watch indicates this as sinus tachycardia.  Patient is concerned that she has POTS.  She notes that she has been seen by a neurologist and recently a  rheumatologist for a false positive ANA.  Her rheumatologist and neurologist questioned if she has dysautonomia.  Patient reports that she has significantly increased her fluid, sodium and electrolyte intake without significant improvement.  She also reports that she has tried compression stockings and increasing her exercise.  She notes that she has tried to exercise on a treadmill that her heart rate will instantly increase and she is unable to tolerate this.  She notes when her heart rate becomes elevated into the 140s to 150s she does become increasingly lightheaded.  She denies any true syncope although notes she has felt close to this.  She reports that her blood pressure has been higher recently.  ROS: .   Today she denies chest pain, shortness of breath, lower extremity edema, fatigue, palpitations, melena, hematuria, hemoptysis, diaphoresis, weakness, presyncope, syncope, orthopnea, and PND.  All other systems are reviewed and otherwise negative.  Studies Reviewed: SABRA    EKG:  EKG is ordered today, personally reviewed, demonstrating  EKG Interpretation Date/Time:  Wednesday August 15 2023 09:30:17 EST Ventricular Rate:  80 PR Interval:  144 QRS Duration:  86 QT Interval:  380 QTC Calculation: 438 R Axis:   28  Text Interpretation: Normal sinus rhythm Low voltage QRS Confirmed by Tahiry Spicer [phone]) on 08/15/2023 10:19:57 AM    CV Studies:  Cardiac Studies & Procedures      ECHOCARDIOGRAM  ECHOCARDIOGRAM COMPLETE 10/23/2022  Narrative ECHOCARDIOGRAM REPORT    Patient Name:   Sheri Johnson Date of Exam: 10/23/2022 Medical Rec #:  [id number]       Height:       69.0 in Accession #:    [id number]      Weight:       240.0 lb Date of Birth:  [birth year]       BSA:          2.232 m Patient Age:    37 years        BP:           140/71 mmHg Patient Gender: F               HR:           75 bpm. Exam Location:  [address]  Procedure: 2D Echo, Cardiac Doppler, Color Doppler, 3D  Echo and Strain Analysis  Indications:    Dyspnea R06.00; SVT  History:        Patient has prior history of Echocardiogram examinations, most recent 01/31/2022. Risk Factors:Hypertension.  Sonographer:    Augustin Seals RDCS Referring Phys: [id number] CAITLIN S WALKER  IMPRESSIONS   1. Left ventricular ejection fraction, by estimation, is 60 to 65%. Left ventricular ejection fraction by 3D volume is 60 %. The left ventricle has normal function. The left ventricle has no regional wall motion abnormalities. Left ventricular diastolic parameters were normal. The average left ventricular global longitudinal strain is -20.9 %. The global longitudinal strain is normal. 2. Right ventricular systolic function is normal. The right ventricular size is normal. There is normal pulmonary artery systolic pressure.  The estimated right ventricular systolic pressure is 21.1 mmHg. 3. The mitral valve is normal in structure. Trivial mitral valve regurgitation. No evidence of mitral stenosis. 4. The aortic valve is tricuspid. Aortic valve regurgitation is not visualized. No aortic stenosis is present. 5. The inferior vena cava is normal in size with greater than 50% respiratory variability, suggesting right atrial pressure of 3 mmHg.  FINDINGS Left Ventricle: Left ventricular ejection fraction, by estimation, is 60 to 65%. Left ventricular ejection fraction by 3D volume is 60 %. The left ventricle has normal function. The left ventricle has no regional wall motion abnormalities. The average left ventricular global longitudinal strain is -20.9 %. The global longitudinal strain is normal. The left ventricular internal cavity size was normal in size. There is borderline left ventricular hypertrophy. Left ventricular diastolic parameters were normal.  Right Ventricle: The right ventricular size is normal. No increase in right ventricular wall thickness. Right ventricular systolic function is normal. There is normal  pulmonary artery systolic pressure. The tricuspid regurgitant velocity is 2.13 m/s, and with an assumed right atrial pressure of 3 mmHg, the estimated right ventricular systolic pressure is 21.1 mmHg.  Left Atrium: Left atrial size was normal in size.  Right Atrium: Right atrial size was normal in size.  Pericardium: There is no evidence of pericardial effusion.  Mitral Valve: The mitral valve is normal in structure. Trivial mitral valve regurgitation. No evidence of mitral valve stenosis.  Tricuspid Valve: The tricuspid valve is normal in structure. Tricuspid valve regurgitation is trivial. No evidence of tricuspid stenosis.  Aortic Valve: The aortic valve is tricuspid. Aortic valve regurgitation is not visualized. No aortic stenosis is present.  Pulmonic Valve: The pulmonic valve was normal in structure. Pulmonic valve regurgitation is trivial. No evidence of pulmonic stenosis.  Aorta: The aortic root is normal in size and structure.  Venous: The inferior vena cava is normal in size with greater than 50% respiratory variability, suggesting right atrial pressure of 3 mmHg.  IAS/Shunts: No atrial level shunt detected by color flow Doppler.   LEFT VENTRICLE PLAX 2D LVIDd:         5.00 cm         Diastology LVIDs:         3.10 cm         LV e' medial:    8.49 cm/s LV PW:         1.00 cm         LV E/e' medial:  7.2 LV IVS:        1.00 cm         LV e' lateral:   13.50 cm/s LVOT diam:     2.20 cm         LV E/e' lateral: 4.5 LV SV:         55 LV SV Index:   25              2D LVOT Area:     3.80 cm        Longitudinal Strain 2D Strain GLS  -20.9 % Avg:  3D Volume EF LV 3D EF:    Left ventricul ar ejection fraction by 3D volume is 60 %.  3D Volume EF: 3D EF:        60 % LV EDV:       101 ml LV ESV:       41 ml LV SV:        60 ml  RIGHT VENTRICLE  RV Basal diam:  3.10 cm RV Mid diam:    2.80 cm RV S prime:     12.20 cm/s TAPSE (M-mode): 2.6 cm  LEFT ATRIUM              Index        RIGHT ATRIUM           Index LA diam:        3.80 cm 1.70 cm/m   RA Area:     12.20 cm LA Vol (A2C):   74.5 ml 33.37 ml/m  RA Volume:   22.90 ml  10.26 ml/m LA Vol (A4C):   63.4 ml 28.40 ml/m LA Biplane Vol: 75.1 ml 33.64 ml/m AORTIC VALVE LVOT Vmax:   74.70 cm/s LVOT Vmean:  48.400 cm/s LVOT VTI:    0.144 m  AORTA Ao Root diam: 3.10 cm Ao Asc diam:  3.50 cm  MITRAL VALVE               TRICUSPID VALVE MV Area (PHT): 3.12 cm    TR Peak grad:   18.1 mmHg MV Decel Time: 243 msec    TR Vmax:        213.00 cm/s MV E velocity: 60.80 cm/s MV A velocity: 39.40 cm/s  SHUNTS MV E/A ratio:  1.54        Systemic VTI:  0.14 m Systemic Diam: 2.20 cm  Soyla Merck MD Electronically signed by Soyla Merck MD Signature Date/Time: 10/23/2022/4:04:57 PM    Final   MONITORS  LONG TERM MONITOR (3-14 DAYS) 11/16/2021  Narrative Patch Wear Time:  1 days and 10 hours (2023-03-25T12:18:43-0400 to 2023-03-26T22:20:23-0400)  Patient had a min HR of 60 bpm, max HR of 155 bpm, and avg HR of 91 bpm. Predominant underlying rhythm was Sinus Rhythm. Isolated SVEs were rare (<1.0%), and no SVE Couplets or SVE Triplets were present. Isolated VEs were rare (<1.0%), and no VE Couplets or VE Triplets were present.  Summary and conclusions: Normal monitor  CT SCANS  CT CORONARY MORPH W/CTA COR W/SCORE 05/04/2022  Addendum 05/04/2022 10:37 AM ADDENDUM REPORT: 05/04/2022 10:35  ADDENDUM: The following report is an over-read performed by radiologist Dr. Reyes Holder of [facility] on May 04 2022. This over-read does not include interpretation of cardiac or coronary anatomy or pathology. The coronary calcium  score/coronary CTA interpretation by the cardiologist is attached.  COMPARISON:  Chest CT January 30, 2022  FINDINGS: Vascular: No acute non-cardiac vascular finding.  Mediastinum/Nodes: No pathologically enlarged mediastinal, or hilar lymph nodes in  the visualized portions of the thorax. Evaluated portions of the esophagus are grossly unremarkable.  Lungs/Pleura: Within the visualized portions of the thorax there are no suspicious appearing pulmonary nodules or masses, there is no acute consolidative airspace disease, no pleural effusions and no pneumothorax  Upper Abdomen: Visualized portions of the upper abdomen are unremarkable.  Musculoskeletal: No acute osseous abnormality.  IMPRESSION: No significant incidental noncardiac finding noted.   Electronically Signed By: Reyes Holder M.D. On: 05/04/2022 10:35  Narrative HISTORY: 38 yo female with chest pain/anginal equiv, intermediate CAD risk, not treadmill candidate Dyspnea on exertion (DOE)  EXAM: Cardiac/Coronary CTA  TECHNIQUE: The patient was scanned on a [employer].  PROTOCOL: A 90 kV prospective scan was triggered in the descending thoracic aorta at 111 HU's. Axial non-contrast 3 mm slices were carried out through the heart. The data set was analyzed on a dedicated work station and scored using the Agatson method. Gantry rotation  speed was 250 msecs and collimation was .6 mm. Beta blockade and 0.8 mg of sl NTG was given. The 3D data set was reconstructed in 5% intervals of the 35-75 % of the R-R cycle. Diastolic phases were analyzed on a dedicated work station using MPR, MIP and VRT modes. The patient received 95 ml OMNIPAQUE  of contrast.  FINDINGS: Quality: Fair, attenuation artifact, HR 75  Coronary calcium  score: The patient's coronary artery calcium  score is 0, which places the patient in the 0 percentile.  Coronary arteries: Normal coronary origins.  Right dominance.  Right Coronary Artery: Dominant.  Normal vessel.  Left Main Coronary Artery: Normal. Bifurcates into the LAD and LCx arteries.  Left Anterior Descending Coronary Artery: Large anterior artery, reaches around the apex. No disease. 2 diagonal branches  without disease.  Left Circumflex Artery: AV groove vessel, no disease. Large OM branch without disease.  Aorta: Normal size, 31 mm at the mid ascending aorta (level of the PA bifurcation) measured double oblique. No calcifications. No dissection.  Aortic Valve: Trileaflet. No calcifications.  Other findings:  Normal pulmonary vein drainage into the left atrium.  Normal left atrial appendage without a thrombus.  Dilated main pulmonary artery to 31 mm, suggestive of pulmonary hypertension.  IMPRESSION: 1. No evidence of CAD, CADRADS = 0.  2. Coronary calcium  score of 0. This was 0 percentile for age and sex matched control.  3. Normal coronary origin with right dominance.  4. Dilated main pulmonary artery to 31 mm, suggestive of pulmonary hypertension.  5. Consider non-coronary causes of chest pain.  Electronically Signed: By: Vinie JAYSON Maxcy M.D. On: 05/04/2022 10:13            Current Reported Medications:.    Current Meds  Medication Sig   acetaminophen  (TYLENOL ) 325 MG tablet Take 2 tablets (650 mg total) by mouth every 6 (six) hours as needed.   albuterol  (VENTOLIN  HFA) 108 (90 Base) MCG/ACT inhaler Inhale 1-2 puffs into the lungs every 6 (six) hours as needed for wheezing or shortness of breath.   ALPRAZolam  (XANAX  XR) 0.5 MG 24 hr tablet Take by mouth.   ALPRAZolam  (XANAX  XR) 1 MG 24 hr tablet Take 1 mg by mouth as needed.   Ascorbic Acid (VITAMIN C) 1000 MG tablet Take 1,000 mg by mouth daily.   b complex vitamins capsule Take 1 capsule by mouth daily.   baclofen (LIORESAL) 10 MG tablet Take 1 tablet by mouth 3 (three) times daily as needed.   cetirizine (ZYRTEC) 10 MG tablet Take 10 mg by mouth 2 (two) times daily.   clobetasol (TEMOVATE) 0.05 % external solution Apply 1 Application topically at bedtime. Apply to scalp   Digestive Enzymes CAPS Take 1 capsule by mouth 2 (two) times daily before a meal.   fluticasone (FLONASE) 50 MCG/ACT nasal spray Place  1 spray into both nostrils daily as needed for allergies or rhinitis.   ibuprofen  (ADVIL ) 600 MG tablet Take 1 tablet (600 mg total) by mouth every 6 (six) hours as needed.   levothyroxine  (SYNTHROID ) 100 MCG tablet Take 100 mcg by mouth every morning.   Magnesium  400 MG CAPS Take 400 mg by mouth daily as needed (Low Magnesium ).   OVER THE COUNTER MEDICATION Take 2 tablets by mouth daily. Medication: Beet Root Supplement   potassium chloride  SA (KLOR-CON  M) 20 MEQ tablet Take 1 tablet (20 mEq total) by mouth daily.   Probiotic Product (PROBIOTIC DAILY PO) Take 2 each by mouth at bedtime.  QVAR REDIHALER 40 MCG/ACT inhaler Inhale 2 puffs into the lungs at bedtime.   TURMERIC CURCUMIN PO Take 2 capsules by mouth daily.   [DISCONTINUED] atenolol  (TENORMIN ) 25 MG tablet Take 1 tablet (25 mg total) by mouth 2 (two) times daily.    Physical Exam:    VS:  BP 128/86 (BP Location: Left Arm, Patient Position: Sitting, Cuff Size: Normal)   Pulse 80   Ht 5' 9 (1.753 m)   Wt 236 lb 3.2 oz (107.1 kg)   SpO2 97%   BMI 34.88 kg/m    Wt Readings from Last 3 Encounters:  08/15/23 236 lb 3.2 oz (107.1 kg)  07/21/23 238 lb 1.6 oz (108 kg)  06/30/23 238 lb (108 kg)   Orthostatic VS for the past 24 hrs (Last 3 readings):  BP- Lying Pulse- Lying BP- Sitting Pulse- Sitting BP- Standing at 0 minutes Pulse- Standing at 0 minutes BP- Standing at 3 minutes Pulse- Standing at 3 minutes  08/15/23 1106 120/76 73 122/79 83 117/76 92 120/79 110    GEN: Well nourished, well developed in no acute distress NECK: No JVD; No carotid bruits CARDIAC: RRR, no murmurs, rubs, gallops RESPIRATORY:  Clear to auscultation without rales, wheezing or rhonchi  ABDOMEN: Soft, non-tender, non-distended EXTREMITIES:  No edema; No acute deformity   Asessement and Plan:.    Palpitations/SVT/tachycardia: S/p ablation.  Patient notes that in recent months her heart rate has been elevated.  She notes when she gets up in the morning  that her heart rate will increase to 140 to 150 bpm.  She has had frequent ED visits for increased heart rate, questional SVT versus sinus tachycardia.  She is concerned she may have POTS, when obtaining orthostatic vital signs today her heart rate did increase from 73 bpm to 110 bpm from laying down to standing, she had no significant change in blood pressure.  She reports that she has already started increasing her fluid, sodium and electrolyte intake.  She notes she will feel slightly better for a short period of time after drinking and IV fluid but then again feels weak and tachycardic.  She notes that she has tried compression stockings without much improvement.  She also reports that she has tried to increase her exercise however this worsens her palpitations.  Encouraged increased fluid intake, electrolyte intake.  Discussed trying recumbent based exercises and wearing her compression stockings regularly.  Encouraged slow position changes.  She is unable to wear a cardiac monitor as she always has reactions to the adhesive.  She will continue to monitor on her Apple Watch.  Will increase her atenolol  to 50 mg in the morning and 25 mg at night.  She plans to also follow-up with Dr. Waddell she notes she has not seen him in recent years.  Check BMET and Magnesium .   Hypokalemia: Patient notes that she has routinely been hypokalemic.  She is on potassium supplements. Check BMET.   Hypertension: Patient notes that her blood pressure has been elevated recently.  Today 128/86.  Atenolol  increased as noted above.   Hypothyroidism: Last TSH 3.021 on 08/07/2023.  On levothyroxine .    Disposition: F/u with Dr. Pietro in 8 weeks. F/u with Dr. Waddell at next available appointment.   Signed, Levis Nazir D Koral Thaden, NP

## 2023-08-15 NOTE — Telephone Encounter (Signed)
 Pt would like a c/b from nurse to discuss recent ED visit as well as a f/u. Please advise

## 2023-08-15 NOTE — Telephone Encounter (Signed)
 Patient identification verified by 2 forms. Sheri Cooks, RN    Called and spoke to patient  Patient states:   -presenting to ED frequently   -visits due to: BP issues, potassium, chest pain, tachycardia   -believes she could have POTS   -takes atenolol  25mg  BID   -this morning HR was 162 when getting out of shower   -decreased to 90 BPM after deep breathing and laying down   -has history of SVT and previous ablation  Patient scheduled for OV 1/8 at 9:15am  Patient agrees, no further questions at this time

## 2023-08-16 ENCOUNTER — Encounter: Payer: Self-pay | Admitting: Neurology

## 2023-08-16 LAB — BASIC METABOLIC PANEL
BUN/Creatinine Ratio: 13 (ref 9–23)
BUN: 11 mg/dL (ref 6–20)
CO2: 22 mmol/L (ref 20–29)
Calcium: 9.9 mg/dL (ref 8.7–10.2)
Chloride: 102 mmol/L (ref 96–106)
Creatinine, Ser: 0.88 mg/dL (ref 0.57–1.00)
Glucose: 85 mg/dL (ref 70–99)
Potassium: 4.8 mmol/L (ref 3.5–5.2)
Sodium: 141 mmol/L (ref 134–144)
eGFR: 86 mL/min/{1.73_m2} (ref >59)

## 2023-08-16 LAB — MAGNESIUM: Magnesium: 2.3 mg/dL (ref 1.6–2.3)

## 2023-08-23 ENCOUNTER — Telehealth: Payer: Self-pay

## 2023-08-23 NOTE — Telephone Encounter (Signed)
Pt checking status of EEG results. Requesting call back

## 2023-08-23 NOTE — Telephone Encounter (Signed)
Call to patient, advised we have not been sent results yet and I would follow up with Dr. Terrace Arabia. Patient appreciative of call

## 2023-08-27 NOTE — Telephone Encounter (Signed)
Pt calling to confirm if results have been received. Requesting call back

## 2023-08-27 NOTE — Telephone Encounter (Signed)
Phone room: Advise pt not received yet and we will contact her as soon as we get them and provider has the chance to review Thanks,  Breon Diss

## 2023-09-10 NOTE — Procedures (Signed)
   HISTORY: 38 year old female with frequent headaches, fatigue  TECHNIQUE:  This is a routine 16 channel EEG recording with one channel devoted to a limited EKG recording.  It was performed during wakefulness, drowsiness and asleep.  Hyperventilation and photic stimulation were performed as activating procedures.  There are minimum muscle and movement artifact noted.  Upon maximum arousal, posterior dominant waking rhythm consistent of rhythmic alpha range activity. Activities are symmetric over the bilateral posterior derivations and attenuated with eye opening.  Photic stimulation did not alter the tracing.  Hyperventilation produced mild/moderate buildup with higher amplitude and the slower activities noted.  During EEG recording, patient developed drowsiness and no deeper stage of sleep was achieved  During EEG recording, there was no epileptiform discharge noted.  EKG demonstrate normal sinus rhythm.  CONCLUSION: This is a  normal awake EEG.  There is no electrodiagnostic evidence of epileptiform discharge.  Levert Feinstein, M.D. Ph.D.  [facility] [address] [place], [place] [address] Phone: [phone] Fax:      [phone]

## 2023-09-17 ENCOUNTER — Encounter: Payer: Self-pay | Admitting: Neurology

## 2023-09-20 ENCOUNTER — Emergency Department (HOSPITAL_BASED_OUTPATIENT_CLINIC_OR_DEPARTMENT_OTHER)
Admission: EM | Admit: 2023-09-20 | Discharge: 2023-09-20 | Discharge status: Against Medical Advice | Payer: 59 | Attending: Emergency Medicine | Admitting: Emergency Medicine

## 2023-09-20 ENCOUNTER — Emergency Department (HOSPITAL_BASED_OUTPATIENT_CLINIC_OR_DEPARTMENT_OTHER): Payer: 59

## 2023-09-20 ENCOUNTER — Other Ambulatory Visit: Payer: Self-pay

## 2023-09-20 ENCOUNTER — Encounter (HOSPITAL_BASED_OUTPATIENT_CLINIC_OR_DEPARTMENT_OTHER): Payer: Self-pay

## 2023-09-20 DIAGNOSIS — Z5321 Procedure and treatment not carried out due to patient leaving prior to being seen by health care provider: Secondary | ICD-10-CM | POA: Diagnosis not present

## 2023-09-20 DIAGNOSIS — R0602 Shortness of breath: Secondary | ICD-10-CM | POA: Insufficient documentation

## 2023-09-20 DIAGNOSIS — R519 Headache, unspecified: Secondary | ICD-10-CM | POA: Diagnosis not present

## 2023-09-20 DIAGNOSIS — R079 Chest pain, unspecified: Secondary | ICD-10-CM | POA: Diagnosis present

## 2023-09-20 DIAGNOSIS — R1310 Dysphagia, unspecified: Secondary | ICD-10-CM | POA: Diagnosis not present

## 2023-09-20 DIAGNOSIS — H9202 Otalgia, left ear: Secondary | ICD-10-CM | POA: Insufficient documentation

## 2023-09-20 DIAGNOSIS — H538 Other visual disturbances: Secondary | ICD-10-CM | POA: Insufficient documentation

## 2023-09-20 NOTE — ED Triage Notes (Addendum)
Pt reports walking up this morning with balance issues, headaches, difficulty swallowing, blurred vision, fullness in left ear this morning at 6 am. Now reports chest pains and SOB.   Gait normal in triage.

## 2023-10-02 ENCOUNTER — Encounter: Payer: Self-pay | Admitting: Neurology

## 2023-10-23 NOTE — Progress Notes (Deleted)
 HPI: FU SVT. Seen with palpitations 01/08/21; was in SVT (ECG with SVT 231 with diffuse ST depression); EMS terminated arrhythmia with adenosine. 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  Cardiac CTA September 2023 showed calcium score 0 and no coronary disease; dilated main pulmonary artery at 31 mm suggestive of pulmonary hypertension.  Echocardiogram March 2024 showed normal LV function.  ABIs with Doppler May 2024 normal.  Venous Dopplers May 2024 normal.  CTA May 2024 showed no acute chest findings including no pulmonary embolus.  Contacted the office recently with high and low blood pressures.  Added to my schedule today.  Since last seen   Current Outpatient Medications  Medication Sig Dispense Refill   acetaminophen (TYLENOL) 325 MG tablet Take 2 tablets (650 mg total) by mouth every 6 (six) hours as needed. 36 tablet 0   albuterol (VENTOLIN HFA) 108 (90 Base) MCG/ACT inhaler Inhale 1-2 puffs into the lungs every 6 (six) hours as needed for wheezing or shortness of breath. 1 each 0   ALPRAZolam (XANAX XR) 0.5 MG 24 hr tablet Take by mouth.     ALPRAZolam (XANAX XR) 1 MG 24 hr tablet Take 1 mg by mouth as needed.     Ascorbic Acid (VITAMIN C) 1000 MG tablet Take 1,000 mg by mouth daily.     atenolol (TENORMIN) 25 MG tablet Take 50 mg (2 tablets) in the morning and 25 mg (1 tablet) in the evening 270 tablet 3   b complex vitamins capsule Take 1 capsule by mouth daily.     baclofen (LIORESAL) 10 MG tablet Take 1 tablet by mouth 3 (three) times daily as needed.     cetirizine (ZYRTEC) 10 MG tablet Take 10 mg by mouth 2 (two) times daily.     clobetasol (TEMOVATE) 0.05 % external solution Apply 1 Application topically at bedtime. Apply to scalp     Digestive Enzymes CAPS Take 1 capsule by mouth 2 (two) times daily before a meal.     DULoxetine (CYMBALTA) 30 MG capsule Take 1  capsule (30 mg total) by mouth daily. 30 capsule 3   ferrous sulfate 325 (65 FE) MG EC tablet Take 1 tablet (325 mg total) by mouth daily with breakfast. (Patient not taking: Reported on 08/15/2023) 30 tablet 6   fluticasone (FLONASE) 50 MCG/ACT nasal spray Place 1 spray into both nostrils daily as needed for allergies or rhinitis.     gabapentin (NEURONTIN) 300 MG capsule Take 300 mg by mouth as needed. (Patient not taking: Reported on 08/15/2023)     ibuprofen (ADVIL) 600 MG tablet Take 1 tablet (600 mg total) by mouth every 6 (six) hours as needed. 30 tablet 0   levothyroxine (SYNTHROID) 100 MCG tablet Take 100 mcg by mouth every morning.     lidocaine (LIDODERM) 5 % Place 1 patch onto the skin daily as needed. Remove & Discard patch within 12 hours or as directed by MD 15 patch 0   Magnesium 400 MG CAPS Take 400 mg by mouth daily as needed (Low Magnesium).     omeprazole (PRILOSEC) 40 MG capsule Take 40 mg by mouth daily. (Patient not taking: Reported on 08/15/2023)     ondansetron (ZOFRAN) 4 MG tablet Take 1 tablet (4 mg total) by mouth every 8 (eight) hours as needed for nausea or vomiting. 20 tablet 6   OVER THE COUNTER MEDICATION Take 2 tablets  by mouth daily. Medication: Beet Root Supplement     potassium chloride SA (KLOR-CON M) 20 MEQ tablet Take 1 tablet (20 mEq total) by mouth daily. 30 tablet 6   Probiotic Product (PROBIOTIC DAILY PO) Take 2 each by mouth at bedtime.     QVAR REDIHALER 40 MCG/ACT inhaler Inhale 2 puffs into the lungs at bedtime.     rizatriptan (MAXALT-MLT) 5 MG disintegrating tablet May repeat in 2 hours if needed 10 tablet 6   tiZANidine (ZANAFLEX) 4 MG tablet Take 1 tablet (4 mg total) by mouth every 6 (six) hours as needed for muscle spasms. 30 tablet 6   TURMERIC CURCUMIN PO Take 2 capsules by mouth daily.     No current facility-administered medications for this visit.     Past Medical History:  Diagnosis Date   Abortion 11/14/2021   Anxiety    Asthma     Frequent headaches    Hashimoto's disease    Hypertension    Hypothyroid    Lupus (systemic lupus erythematosus) (HCC)    SVT (supraventricular tachycardia) (HCC)     Past Surgical History:  Procedure Laterality Date   CESAREAN SECTION     x2   LAPAROSCOPIC APPENDECTOMY N/A 12/14/2016   Procedure: APPENDECTOMY LAPAROSCOPIC;  Surgeon: Griselda Miner, MD;  Location: [facility] OR;  Service: General;  Laterality: N/A;   SVT ABLATION N/A 03/31/2022   Procedure: SVT ABLATION;  Surgeon: Marinus Maw, MD;  Location: [facility] INVASIVE CV LAB;  Service: Cardiovascular;  Laterality: N/A;    Social History   Socioeconomic History   Marital status: Single    Spouse name: Not on file   Number of children: 3   Years of education: Not on file   Highest education level: Associate degree: academic program  Occupational History   Not on file  Tobacco Use   Smoking status: Never   Smokeless tobacco: Never  Vaping Use   Vaping status: Never Used  Substance and Sexual Activity   Alcohol use: Not Currently    Comment: Rare   Drug use: Never   Sexual activity: Yes  Other Topics Concern   Not on file  Social History Narrative   Caffeine- none   Social Drivers of Health   Financial Resource Strain: Low Risk  (08/10/2023)   Received from [facility]   Overall Financial Resource Strain (CARDIA)    Difficulty of Paying Living Expenses: Not hard at all  Food Insecurity: No Food Insecurity (08/10/2023)   Received from [facility]   Hunger Vital Sign    Worried About Running Out of Food in the Last Year: Never true    Ran Out of Food in the Last Year: Never true  Transportation Needs: No Transportation Needs (08/10/2023)   Received from [facility] - Transportation    Lack of Transportation (Medical): No    Lack of Transportation (Non-Medical): No  Physical Activity: Sufficiently Active (01/29/2023)   Received from [facility], [facility]   Exercise Vital Sign    Days of Exercise  per Week: 5 days    Minutes of Exercise per Session: 60 min  Stress: Stress Concern Present (01/29/2023)   Received from [facility], [facility] - Occupational Stress Questionnaire    Feeling of Stress : Very much  Social Connections: Moderately Integrated (01/29/2023)   Received from [facility], [facility]   Social Network    How would you rate your  social network (family, work, friends)?: Adequate participation with social networks  Intimate Partner Violence: Not At Risk (01/29/2023)   Received from [facility], [facility]   HITS    Over the last 12 months how often did your partner physically hurt you?: Never    Over the last 12 months how often did your partner insult you or talk down to you?: Never    Over the last 12 months how often did your partner threaten you with physical harm?: Never    Over the last 12 months how often did your partner scream or curse at you?: Never    Family History  Problem Relation Age of Onset   Hypertension Mother    Hyperthyroidism Mother     ROS: no fevers or chills, productive cough, hemoptysis, dysphasia, odynophagia, melena, hematochezia, dysuria, hematuria, rash, seizure activity, orthopnea, PND, pedal edema, claudication. Remaining systems are negative.  Physical Exam: Well-developed well-nourished in no acute distress.  Skin is warm and dry.  HEENT is normal.  Neck is supple.  Chest is clear to auscultation with normal expansion.  Cardiovascular exam is regular rate and rhythm.  Abdominal exam nontender or distended. No masses palpated. Extremities show no edema. neuro grossly intact  ECG- personally reviewed  A/P  1 SVT-patient is status post ablation of AV node reentrant tachycardia.  She has had recurrent palpitations since her previous procedure and will therefore continue beta-blockade.  2 palpitations-she continues to have occasional palpitations.  Continue  beta-blocker.  3 chest pain-previous episodes of atypical chest pain but CTA showed no coronary disease.  Electrocardiogram shows no ST changes.  Will continue observation for now.  4 hypertension-blood pressure controlled.  Continue present medical regimen.  5 anxiety-Per primary care.  Sheri Millers, MD

## 2023-10-23 NOTE — Telephone Encounter (Signed)
 Pt called needing to speak to the RN regarding her visit to the Neurosurgeon. She states that they informed her that the neck pain has nothing to do with the symptoms she is experiencing. She states that she spoke to her PCP and they informed her that Neurosurgery can refer her to pain management. Neurosurgery is willing to send her to pain management but insists that the symptoms she is having mentioned in her MyChart message, have nothing to do with the neck pain. Please advise.

## 2023-10-25 ENCOUNTER — Telehealth: Payer: Self-pay

## 2023-10-25 ENCOUNTER — Encounter: Payer: Self-pay | Admitting: Cardiology

## 2023-10-25 NOTE — Telephone Encounter (Signed)
Unable to leave voicemail   Phone just rings.

## 2023-11-02 ENCOUNTER — Telehealth: Payer: Self-pay | Admitting: Cardiology

## 2023-11-02 NOTE — Telephone Encounter (Signed)
 Pt calling in wanting to speak with a nurse in regards to the labs she was suppose to have set up after her last appt. Please advise

## 2023-11-02 NOTE — Telephone Encounter (Signed)
 Called and spoke to pt. DOB and last name verified.  Per last OV in 08/2023, patient was to be scheduled for next available EP visit with Dr. Ladona Ridgel. Will route to scheduling team to schedule appt.

## 2023-11-05 ENCOUNTER — Emergency Department (HOSPITAL_COMMUNITY)
Admission: EM | Admit: 2023-11-05 | Discharge: 2023-11-05 | Disposition: A | Discharge status: Home / Self Care | Attending: Emergency Medicine | Admitting: Emergency Medicine

## 2023-11-05 ENCOUNTER — Encounter (HOSPITAL_COMMUNITY): Payer: Self-pay

## 2023-11-05 ENCOUNTER — Ambulatory Visit: Payer: 59 | Admitting: Cardiology

## 2023-11-05 ENCOUNTER — Other Ambulatory Visit: Payer: Self-pay

## 2023-11-05 ENCOUNTER — Emergency Department (HOSPITAL_COMMUNITY)

## 2023-11-05 DIAGNOSIS — I4581 Long QT syndrome: Secondary | ICD-10-CM | POA: Diagnosis not present

## 2023-11-05 DIAGNOSIS — Z79899 Other long term (current) drug therapy: Secondary | ICD-10-CM | POA: Insufficient documentation

## 2023-11-05 DIAGNOSIS — K2900 Acute gastritis without bleeding: Secondary | ICD-10-CM | POA: Insufficient documentation

## 2023-11-05 DIAGNOSIS — R109 Unspecified abdominal pain: Secondary | ICD-10-CM | POA: Diagnosis present

## 2023-11-05 DIAGNOSIS — I10 Essential (primary) hypertension: Secondary | ICD-10-CM | POA: Diagnosis not present

## 2023-11-05 DIAGNOSIS — R9431 Abnormal electrocardiogram [ECG] [EKG]: Secondary | ICD-10-CM

## 2023-11-05 LAB — TROPONIN I (HIGH SENSITIVITY): Troponin I (High Sensitivity): <2 ng/L (ref <18)

## 2023-11-05 LAB — URINALYSIS, ROUTINE W REFLEX MICROSCOPIC
Bilirubin Urine: NEGATIVE
Glucose, UA: NEGATIVE mg/dL
Hgb urine dipstick: NEGATIVE
Ketones, ur: NEGATIVE mg/dL
Leukocytes,Ua: NEGATIVE
Nitrite: NEGATIVE
Protein, ur: NEGATIVE mg/dL
Specific Gravity, Urine: 1.002 — ABNORMAL LOW (ref 1.005–1.030)
pH: 7 (ref 5.0–8.0)

## 2023-11-05 LAB — COMPREHENSIVE METABOLIC PANEL WITH GFR
ALT: 24 U/L (ref 0–44)
AST: 19 U/L (ref 15–41)
Albumin: 4.4 g/dL (ref 3.5–5.0)
Alkaline Phosphatase: 37 U/L — ABNORMAL LOW (ref 38–126)
Anion gap: 9 (ref 5–15)
BUN: 12 mg/dL (ref 6–20)
CO2: 23 mmol/L (ref 22–32)
Calcium: 9.2 mg/dL (ref 8.9–10.3)
Chloride: 105 mmol/L (ref 98–111)
Creatinine, Ser: 0.73 mg/dL (ref 0.44–1.00)
GFR, Estimated: >60 mL/min (ref >60)
Glucose, Bld: 100 mg/dL — ABNORMAL HIGH (ref 70–99)
Potassium: 3.7 mmol/L (ref 3.5–5.1)
Sodium: 137 mmol/L (ref 135–145)
Total Bilirubin: 0.4 mg/dL (ref 0.0–1.2)
Total Protein: 7.6 g/dL (ref 6.5–8.1)

## 2023-11-05 LAB — CBC
HCT: 43.6 % (ref 36.0–46.0)
Hemoglobin: 14.8 g/dL (ref 12.0–15.0)
MCH: 29.4 pg (ref 26.0–34.0)
MCHC: 33.9 g/dL (ref 30.0–36.0)
MCV: 86.5 fL (ref 80.0–100.0)
Platelets: 290 10*3/uL (ref 150–400)
RBC: 5.04 MIL/uL (ref 3.87–5.11)
RDW: 12.8 % (ref 11.5–15.5)
WBC: 8.1 10*3/uL (ref 4.0–10.5)
nRBC: 0 % (ref 0.0–0.2)

## 2023-11-05 LAB — LIPASE, BLOOD: Lipase: 28 U/L (ref 11–51)

## 2023-11-05 LAB — HCG, SERUM, QUALITATIVE: Preg, Serum: NEGATIVE

## 2023-11-05 MED ORDER — ALUM & MAG HYDROXIDE-SIMETH 200-200-20 MG/5ML PO SUSP
30.0000 mL | Freq: Once | ORAL | Status: AC
  Administered 2023-11-05: 30 mL via ORAL
  Filled 2023-11-05: qty 30

## 2023-11-05 MED ORDER — PANTOPRAZOLE SODIUM 40 MG IV SOLR
40.0000 mg | Freq: Once | INTRAVENOUS | Status: AC
  Administered 2023-11-05: 40 mg via INTRAVENOUS
  Filled 2023-11-05: qty 10

## 2023-11-05 MED ORDER — SODIUM CHLORIDE 0.9 % IV BOLUS
1000.0000 mL | Freq: Once | INTRAVENOUS | Status: AC
  Administered 2023-11-05: 1000 mL via INTRAVENOUS

## 2023-11-05 MED ORDER — LIDOCAINE VISCOUS HCL 2 % MT SOLN
15.0000 mL | Freq: Once | OROMUCOSAL | Status: AC
  Administered 2023-11-05: 15 mL via ORAL
  Filled 2023-11-05: qty 15

## 2023-11-05 MED ORDER — PANTOPRAZOLE SODIUM 40 MG PO TBEC: 40.0000 mg | DELAYED_RELEASE_TABLET | Freq: Every day | ORAL | 1 refills | Status: DC

## 2023-11-05 NOTE — ED Triage Notes (Signed)
 Bilateral lower abdominal pain for 2 weeks. 5 days ago stgarted having left sided rib pain and chest burning. No relief with acid reflux medications. C/o diarrhea for a week and a half.

## 2023-11-05 NOTE — ED Provider Notes (Signed)
 [place] EMERGENCY DEPARTMENT AT [facility] Provider Note   CSN: [id number] Arrival date & time: 11/05/23  1301     History  Chief Complaint  Patient presents with   Abdominal Pain   Gastroesophageal Reflux    Sheri Johnson is a 38 y.o. female with past medical history of SVT, POTS, hypertension, migraine presenting to emergency room with complaint of abdominal pain associated with nausea and feeling like she is going to vomit.  Patient reports she feels that her esophagus hurts and describes the pain as a burning sensation that is worse with eating.  Reports this started approximately a few days after taking doxycycline.  Patient is now finished with doxycycline.  She is aware that doxycycline can cause sensitivity to sunlight as well as pill esophagitis.  She reports she has been taking Pepcid with minimal relief.  Denies any focal abdominal pain, diarrhea, constipation, fever, presyncope or syncope.   Abdominal Pain Gastroesophageal Reflux Associated symptoms include abdominal pain.       Home Medications Prior to Admission medications   Medication Sig Start Date End Date Taking? Authorizing Provider  acetaminophen (TYLENOL) 325 MG tablet Take 2 tablets (650 mg total) by mouth every 6 (six) hours as needed. 06/30/23   Sloan Leiter, DO  albuterol (VENTOLIN HFA) 108 (90 Base) MCG/ACT inhaler Inhale 1-2 puffs into the lungs every 6 (six) hours as needed for wheezing or shortness of breath. 06/05/23   Radford Pax, NP  ALPRAZolam (XANAX XR) 0.5 MG 24 hr tablet Take by mouth. 07/25/22   [provider]  ALPRAZolam (XANAX XR) 1 MG 24 hr tablet Take 1 mg by mouth as needed.    [provider]  Ascorbic Acid (VITAMIN C) 1000 MG tablet Take 1,000 mg by mouth daily.    [provider]  atenolol (TENORMIN) 25 MG tablet Take 50 mg (2 tablets) in the morning and 25 mg (1 tablet) in the evening 08/15/23   Reather Littler D, NP  b complex vitamins  capsule Take 1 capsule by mouth daily.    [provider]  baclofen (LIORESAL) 10 MG tablet Take 1 tablet by mouth 3 (three) times daily as needed. 06/29/23   [provider]  cetirizine (ZYRTEC) 10 MG tablet Take 10 mg by mouth 2 (two) times daily.    [provider]  clobetasol (TEMOVATE) 0.05 % external solution Apply 1 Application topically at bedtime. Apply to scalp 02/27/22   [provider]  Digestive Enzymes CAPS Take 1 capsule by mouth 2 (two) times daily before a meal.    [provider]  DULoxetine (CYMBALTA) 30 MG capsule Take 1 capsule (30 mg total) by mouth daily. 06/26/23   Levert Feinstein, MD  ferrous sulfate 325 (65 FE) MG EC tablet Take 1 tablet (325 mg total) by mouth daily with breakfast. Patient not taking: Reported on 08/15/2023 10/11/22   Ocie Doyne, MD  fluticasone (FLONASE) 50 MCG/ACT nasal spray Place 1 spray into both nostrils daily as needed for allergies or rhinitis.    [provider]  gabapentin (NEURONTIN) 300 MG capsule Take 300 mg by mouth as needed. Patient not taking: Reported on 08/15/2023 01/11/23   [provider]  ibuprofen (ADVIL) 600 MG tablet Take 1 tablet (600 mg total) by mouth every 6 (six) hours as needed. 06/30/23   Sloan Leiter, DO  levothyroxine (SYNTHROID) 100 MCG tablet Take 100 mcg by mouth every morning.    [provider]  lidocaine (LIDODERM) 5 % Place 1 patch onto the skin daily as needed. Remove & Discard patch within 12 hours or as directed by MD 06/30/23   Sloan Leiter, DO  Magnesium 400 MG CAPS Take 400 mg by mouth daily as needed (Low Magnesium).    [provider]  omeprazole (PRILOSEC) 40 MG capsule Take 40 mg by mouth daily. Patient not taking: Reported on 08/15/2023 01/22/23   [provider]  ondansetron (ZOFRAN) 4 MG tablet Take 1 tablet (4 mg total) by mouth every 8 (eight) hours as needed for nausea or vomiting. 07/25/23   Levert Feinstein, MD  OVER THE  COUNTER MEDICATION Take 2 tablets by mouth daily. Medication: Beet Root Supplement    [provider]  potassium chloride SA (KLOR-CON M) 20 MEQ tablet Take 1 tablet (20 mEq total) by mouth daily. 12/14/21   Tereso Newcomer T, PA-C  Probiotic Product (PROBIOTIC DAILY PO) Take 2 each by mouth at bedtime.    [provider]  QVAR REDIHALER 40 MCG/ACT inhaler Inhale 2 puffs into the lungs at bedtime. 01/15/23   [provider]  rizatriptan (MAXALT-MLT) 5 MG disintegrating tablet May repeat in 2 hours if needed 07/25/23   Levert Feinstein, MD  tiZANidine (ZANAFLEX) 4 MG tablet Take 1 tablet (4 mg total) by mouth every 6 (six) hours as needed for muscle spasms. 07/25/23   Levert Feinstein, MD  TURMERIC CURCUMIN PO Take 2 capsules by mouth daily.    [provider]      Allergies    Metoprolol, Cephalexin, Cipro [ciprofloxacin hcl], Prednisone, and Sulfamethoxazole-trimethoprim    Review of Systems   Review of Systems  Gastrointestinal:  Positive for abdominal pain.    Physical Exam Updated Vital Signs BP (!) 140/73 (BP Location: Left Arm)   Pulse 82   Temp 98.5 F (36.9 C) (Oral)   Resp 18   Ht 5\' 9"  (1.753 m)   Wt 105.2 kg   SpO2 98%   BMI 34.26 kg/m  Physical Exam Vitals and nursing note reviewed.  Constitutional:      General: She is not in acute distress.    Appearance: She is not toxic-appearing.  HENT:     Head: Normocephalic and atraumatic.  Eyes:     General: No scleral icterus.    Conjunctiva/sclera: Conjunctivae normal.  Cardiovascular:     Rate and Rhythm: Normal rate and regular rhythm.     Pulses: Normal pulses.     Heart sounds: Normal heart sounds.  Pulmonary:     Effort: Pulmonary effort is normal. No respiratory distress.     Breath sounds: Normal breath sounds.  Abdominal:     General: Abdomen is flat. Bowel sounds are normal. There is no distension.     Palpations: Abdomen is soft. There is no mass.     Tenderness: There is no  abdominal tenderness.  Musculoskeletal:     Right lower leg: No edema.     Left lower leg: No edema.  Skin:    General: Skin is warm and dry.     Findings: No lesion.  Neurological:     General: No focal deficit present.     Mental Status: She is alert and oriented to person, place, and time. Mental status is at baseline.     ED Results / Procedures / Treatments   Labs (all labs ordered are listed, but only abnormal results are displayed) Labs Reviewed  COMPREHENSIVE METABOLIC PANEL WITH GFR - Abnormal; Notable  for the following components:      Result Value   Glucose, Bld 100 (*)    Alkaline Phosphatase 37 (*)    All other components within normal limits  URINALYSIS, ROUTINE W REFLEX MICROSCOPIC - Abnormal; Notable for the following components:   Color, Urine STRAW (*)    Specific Gravity, Urine 1.002 (*)    All other components within normal limits  LIPASE, BLOOD  CBC  HCG, SERUM, QUALITATIVE  TROPONIN I (HIGH SENSITIVITY)    EKG EKG Interpretation Date/Time:  Monday November 05 2023 13:12:21 EDT Ventricular Rate:  84 PR Interval:  150 QRS Duration:  66 QT Interval:  439 QTC Calculation: 519 R Axis:   34  Text Interpretation: Sinus rhythm Low voltage, precordial leads Borderline T abnormalities, anterior leads Prolonged QT interval slt, qt interval prolonged Confirmed by Jacalyn Lefevre [phone]) on 11/05/2023 2:27:29 PM  Radiology DG Chest 2 View Result Date: 11/05/2023 CLINICAL DATA:  Chest pain. EXAM: CHEST - 2 VIEW COMPARISON:  Chest radiograph dated 09/20/2023. FINDINGS: Stable cardiomediastinal silhouette. Similar mild left basilar atelectasis. No focal consolidation, pleural effusion, or pneumothorax. No acute osseous abnormality. IMPRESSION: Similar mild left basilar atelectasis. Otherwise, no acute cardiopulmonary findings. Electronically Signed   By: Hart Robinsons M.D.   On: 11/05/2023 15:35    Procedures Procedures    Medications Ordered in  ED Medications  pantoprazole (PROTONIX) injection 40 mg (40 mg Intravenous Given 11/05/23 1433)  alum & mag hydroxide-simeth (MAALOX/MYLANTA) 200-200-20 MG/5ML suspension 30 mL (30 mLs Oral Given 11/05/23 1429)    And  lidocaine (XYLOCAINE) 2 % viscous mouth solution 15 mL (15 mLs Oral Given 11/05/23 1429)  sodium chloride 0.9 % bolus 1,000 mL (1,000 mLs Intravenous New Bag/Given 11/05/23 1428)    ED Course/ Medical Decision Making/ A&P Clinical Course as of 11/05/23 1720  Mon Nov 05, 2023  1558 Patient feeling better. Tolerating oral intake.  [JB]    Clinical Course User Index [JB] Sianna Garofano, Horald Chestnut, PA-C                                 Medical Decision Making Amount and/or Complexity of Data Reviewed Labs: ordered. Radiology: ordered.  Risk OTC drugs. Prescription drug management.   Braley Luckenbaugh 38 y.o. presented today for chest pain. Working DDx that I considered at this time includes, but not limited to, ACS, GERD, pe, pna, aortic dissection, pneumothorax, MSK path, anemia, esophageal rupture, CHF exacerbation, valvular disorder, myocarditis, pericarditis, endocarditis, pericardial effusion/cardiac tamponade, pulmonary edema, gastritis/PUD, esophagitis.  R/o Dx:  PE: advanced imaging will not be ordered as alternative diagnoses are more likely at this time Aortic Dissection: less likely based on the history of present illness - location, quality, onset, and severity of symptoms in this case.   Review of prior external notes: 10/26/23   Unique Tests and My Interpretation:  EKG: Rate, rhythm, axis, intervals all examined: QT prolongation  CBC without leukocytosis and no anemia.  CMP with no electrolyte abnormality.  No elevation in AST, ALT, normal total bili.  No anion gap.  Lipase 28.  Troponin 2, hcg negative. Urine negative.    Problem List / ED Course / Critical interventions / Medication management  Patient reporting with abdominal pain for 2 weeks after starting  Doxycycline. Reports pain is a burning sensation in her upper abdomen and chest.  Patient reports that she has been trying Pepcid for symptoms and not noted significant  improvement.  She reports she has had 1-2 episodes of loose stool per week.  Has not noticed any blood in stool or mucus.  She denies any specific chest pain however states she is complaining of burning chest pain will check troponin EKG as well as chest x-ray.  She has no focal area of tenderness on exam thus do not feel that CT abd/pelvis is needed at this time.  He is hemodynamically stable and well-appearing.  Will check labs, order Protonix and GI cocktail and will reassess. Patient reports improvement of symptoms after GI cocktail and Protonix.  She is tolerating oral intake.  I will send her home with Protonix.  She is established with GI and will follow-up.  Chest x-ray negative.  EKG without any ST changes however does show QT prolongation which we discussed.  Her CBC without significant leukocytosis and no anemia.  CMP is unremarkable. She is feeling better, repeat abdominal exam continues to show abdomen soft nontender nondistended.  She was given strict return precautions.  She has appropriate follow-up.  Feel she is stable for discharge. I ordered medication including Protonix, gi cocktail  Reevaluation of the patient after these medicines showed that the patient improved Patients vitals assessed. Upon arrival patient is hemodynamically stable.  I have reviewed the patients home medicines and have made adjustments as needed    Plan:  F/u w/ PCP to ensure resolution of sx.  Patient was given return precautions. Patient stable for discharge at this time.  Patient educated on sx/ dx and verbalized understanding of plan.  Will return to ER w/ new or worsening sx.          Final Clinical Impression(s) / ED Diagnoses Final diagnoses:  Acute gastritis without hemorrhage, unspecified gastritis type  QT prolongation     Rx / DC Orders ED Discharge Orders          Ordered    pantoprazole (PROTONIX) 40 MG tablet  Daily        11/05/23 1557              Tashe Purdon, Horald Chestnut, PA-C 11/05/23 1837    Jacalyn Lefevre, MD 11/06/23 [phone]

## 2023-11-05 NOTE — Discharge Instructions (Signed)
 Follow up with PCP for todays visit.   Follow up with GI for gastritis. Start Protonix, avoid triggers.   Return with new or worsening symptoms.

## 2023-11-06 ENCOUNTER — Telehealth: Payer: Self-pay

## 2023-11-06 NOTE — Telephone Encounter (Signed)
 Call to patient, no answer. Left message to call back and mychart message sent

## 2023-11-07 ENCOUNTER — Encounter: Payer: Self-pay | Admitting: Cardiology

## 2023-12-06 NOTE — Telephone Encounter (Signed)
 error

## 2023-12-24 ENCOUNTER — Ambulatory Visit: Admitting: Internal Medicine

## 2023-12-29 NOTE — Progress Notes (Deleted)
 HPI: FU SVT. Seen with palpitations 01/08/21; was in SVT (ECG with SVT 231 with diffuse ST depression); EMS terminated arrhythmia with adenosine . 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  Cardiac CTA September 2023 showed calcium  score 0 and no coronary disease; dilated main pulmonary artery at 31 mm suggestive of pulmonary hypertension.  Echocardiogram March 2024 showed normal LV function.  ABIs with Doppler May 2024 normal.  Venous Dopplers May 2024 normal.  CTA December 2024 showed no pulmonary embolus.  Since last seen   Current Outpatient Medications  Medication Sig Dispense Refill   acetaminophen  (TYLENOL ) 325 MG tablet Take 2 tablets (650 mg total) by mouth every 6 (six) hours as needed. 36 tablet 0   albuterol  (VENTOLIN  HFA) 108 (90 Base) MCG/ACT inhaler Inhale 1-2 puffs into the lungs every 6 (six) hours as needed for wheezing or shortness of breath. 1 each 0   ALPRAZolam  (XANAX  XR) 0.5 MG 24 hr tablet Take by mouth.     ALPRAZolam  (XANAX  XR) 1 MG 24 hr tablet Take 1 mg by mouth as needed.     Ascorbic Acid (VITAMIN C) 1000 MG tablet Take 1,000 mg by mouth daily.     atenolol  (TENORMIN ) 25 MG tablet Take 50 mg (2 tablets) in the morning and 25 mg (1 tablet) in the evening 270 tablet 3   b complex vitamins capsule Take 1 capsule by mouth daily.     baclofen (LIORESAL) 10 MG tablet Take 1 tablet by mouth 3 (three) times daily as needed.     cetirizine (ZYRTEC) 10 MG tablet Take 10 mg by mouth 2 (two) times daily.     clobetasol (TEMOVATE) 0.05 % external solution Apply 1 Application topically at bedtime. Apply to scalp     Digestive Enzymes CAPS Take 1 capsule by mouth 2 (two) times daily before a meal.     DULoxetine  (CYMBALTA ) 30 MG capsule Take 1 capsule (30 mg total) by mouth daily. 30 capsule 3   ferrous sulfate  325 (65 FE) MG EC tablet Take 1 tablet (325 mg total) by  mouth daily with breakfast. (Patient not taking: Reported on 08/15/2023) 30 tablet 6   fluticasone (FLONASE) 50 MCG/ACT nasal spray Place 1 spray into both nostrils daily as needed for allergies or rhinitis.     gabapentin (NEURONTIN) 300 MG capsule Take 300 mg by mouth as needed. (Patient not taking: Reported on 08/15/2023)     ibuprofen  (ADVIL ) 600 MG tablet Take 1 tablet (600 mg total) by mouth every 6 (six) hours as needed. 30 tablet 0   levothyroxine  (SYNTHROID ) 100 MCG tablet Take 100 mcg by mouth every morning.     lidocaine  (LIDODERM ) 5 % Place 1 patch onto the skin daily as needed. Remove & Discard patch within 12 hours or as directed by MD 15 patch 0   Magnesium  400 MG CAPS Take 400 mg by mouth daily as needed (Low Magnesium ).     ondansetron  (ZOFRAN ) 4 MG tablet Take 1 tablet (4 mg total) by mouth every 8 (eight) hours as needed for nausea or vomiting. 20 tablet 6   OVER THE COUNTER MEDICATION Take 2 tablets by mouth daily. Medication: Beet Root Supplement     pantoprazole  (PROTONIX ) 40 MG tablet Take 1 tablet (40 mg total) by mouth daily. 30 tablet 1   potassium chloride  SA (KLOR-CON  M) 20 MEQ tablet Take 1 tablet (20 mEq  total) by mouth daily. 30 tablet 6   Probiotic Product (PROBIOTIC DAILY PO) Take 2 each by mouth at bedtime.     QVAR REDIHALER 40 MCG/ACT inhaler Inhale 2 puffs into the lungs at bedtime.     rizatriptan  (MAXALT -MLT) 5 MG disintegrating tablet May repeat in 2 hours if needed 10 tablet 6   tiZANidine  (ZANAFLEX ) 4 MG tablet Take 1 tablet (4 mg total) by mouth every 6 (six) hours as needed for muscle spasms. 30 tablet 6   TURMERIC CURCUMIN PO Take 2 capsules by mouth daily.     No current facility-administered medications for this visit.     Past Medical History:  Diagnosis Date   Abortion 11/14/2021   Anxiety    Asthma    Frequent headaches    Hashimoto's disease    Hypertension    Hypothyroid    Lupus (systemic lupus erythematosus) (HCC)    SVT  (supraventricular tachycardia) (HCC)     Past Surgical History:  Procedure Laterality Date   CESAREAN SECTION     x2   LAPAROSCOPIC APPENDECTOMY N/A 12/14/2016   Procedure: APPENDECTOMY LAPAROSCOPIC;  Surgeon: Caralyn Chandler, MD;  Location: [facility] OR;  Service: General;  Laterality: N/A;   SVT ABLATION N/A 03/31/2022   Procedure: SVT ABLATION;  Surgeon: Tammie Fall, MD;  Location: [facility];  Service: Cardiovascular;  Laterality: N/A;    Social History   Socioeconomic History   Marital status: Single    Spouse name: Not on file   Number of children: 3   Years of education: Not on file   Highest education level: Associate degree: academic program  Occupational History   Not on file  Tobacco Use   Smoking status: Never   Smokeless tobacco: Never  Vaping Use   Vaping status: Never Used  Substance and Sexual Activity   Alcohol use: Not Currently    Comment: Rare   Drug use: Never   Sexual activity: Yes  Other Topics Concern   Not on file  Social History Narrative   Caffeine- none   Social Drivers of Health   Financial Resource Strain: Low Risk  (08/10/2023)   Received from [facility]   Overall Financial Resource Strain (CARDIA)    Difficulty of Paying Living Expenses: Not hard at all  Food Insecurity: No Food Insecurity (08/10/2023)   Received [facility]   Hunger Vital Sign    Worried About Running Out of Food in the Last Year: Never true    Ran Out of Food in the Last Year: Never true  Transportation Needs: No Transportation Needs (08/10/2023)   [facility] - Transportation    Lack of Transportation (Medical): No    Lack of Transportation (Non-Medical): No  Physical Activity: Sufficiently Active (01/29/2023)   Received from [facility]   Exercise Vital Sign    Days of Exercise per Week: 5 days    Minutes of Exercise per Session: 60 min  Stress: Stress Concern Present (01/29/2023)   Received from [facility] of Occupational Health - Occupational Stress Questionnaire    Feeling of Stress : Very much  Social Connections: Moderately Integrated (01/29/2023)   Received from [facility], [facility]   Social Network    How would you rate your social network (family, work, friends)?: Adequate participation with social networks  Intimate Partner Violence: Not At Risk (01/29/2023)   [facility]   HITS    Over the last 12 months how often did your partner physically hurt you?: Never    Over the last 12 months how often did your partner insult you or talk down to you?: Never    Over the last 12 months how often did your partner threaten you with physical harm?: Never    Over the last 12 months how often did your partner scream or curse at you?: Never    Family History  Problem Relation Age of Onset   Hypertension Mother    Hyperthyroidism Mother     ROS: no fevers or chills, productive cough, hemoptysis, dysphasia, odynophagia, melena, hematochezia, dysuria, hematuria, rash, seizure activity, orthopnea, PND, pedal edema, claudication. Remaining systems are negative.  Physical Exam: Well-developed well-nourished in no acute distress.  Skin is warm and dry.  HEENT is normal.  Neck is supple.  Chest is clear to auscultation with normal expansion.  Cardiovascular exam is regular rate and rhythm.  Abdominal exam nontender or distended. No masses palpated. Extremities show no edema. neuro grossly intact  ECG- personally reviewed  A/P  1 history of supraventricular tachycardia-patient has had previous AV nodal reentrant tachycardia ablated.  She has had problems with palpitations since.  Will continue beta-blocker.  2 hypertension-blood pressure controlled.  Continue present medications.  3 history of chest pain-patient continues to have occasional chest pain.  Previous CTA showed no coronary disease and electrocardiogram  shows no diagnostic ST changes.  No plans for further ischemia evaluation at this point.  4 palpitations-  5 anxiety-Per primary care.  Alexandria Angel, MD

## 2024-01-09 ENCOUNTER — Other Ambulatory Visit: Payer: Self-pay

## 2024-01-09 MED ORDER — FERROUS SULFATE 325 (65 FE) MG PO TBEC: 325.0000 mg | DELAYED_RELEASE_TABLET | Freq: Every day | ORAL | 6 refills | Status: AC

## 2024-01-11 ENCOUNTER — Ambulatory Visit: Admitting: Cardiology

## 2024-02-04 ENCOUNTER — Encounter: Payer: Self-pay | Admitting: Cardiology

## 2024-02-25 ENCOUNTER — Encounter: Payer: Self-pay | Admitting: Internal Medicine

## 2024-02-25 ENCOUNTER — Ambulatory Visit: Attending: Internal Medicine | Admitting: Internal Medicine

## 2024-02-25 VITALS — BP 112/76 | HR 80 | Ht 69.0 in | Wt 245.6 lb

## 2024-02-25 DIAGNOSIS — R002 Palpitations: Secondary | ICD-10-CM

## 2024-02-25 NOTE — Patient Instructions (Addendum)
 Medication Instructions:  Your physician has recommended you make the following change in your medication:   For 1 month- Take atenolol  1 1/2 tablets every other day in the morning. Take 2 tablets in the morning on other days. Take 1 tablet at night.  After a month- Take atenolol  1 1/2 tablets in the morning. Take 1 tablet at night.  Lab Work: None ordered.  You may go to any [facility] Location for your lab work:  [facility] - [address] ([facility]) - [address] [phone] [address]  [place] - [address]   [place]  - [address]   [place] - [address] - [address] [employer]  - [address] ([facility]   If you have labs (blood work) drawn today and your tests are completely normal, you will receive your results only by: [employer] (if you have MyChart)  If you have any lab test that is abnormal or we need to change your treatment, we will call you or send a MyChart message to review the results.  Testing/Procedures: None ordered.  Follow-Up: At [facility], you and your health needs are our priority.  As part of our continuing mission to provide you with exceptional heart care, we have created designated Provider Care Teams.  These Care Teams include your primary Cardiologist (physician) and Advanced Practice Providers (APPs -  Physician Assistants and Nurse Practitioners) who all work together to provide you with the care you need, when you need it.  Your next appointment:   1 year(s)  The format for your next appointment:   In Person  Provider:   Donnice Primus, MD or one of the following Advanced Practice Providers on your designated Care Team:   Charlies Arthur, [place] Ozell Jodie Passey, [place] Leotis Barrack, NP  Note: Remote monitoring is used to monitor your Pacemaker/ ICD from home. This monitoring  reduces the number of office visits required to check your device to one time per year. It allows us  to keep an eye on the functioning of your device to ensure it is working properly.

## 2024-02-25 NOTE — Progress Notes (Signed)
 HPI [name] returns today for followup. She is a pleasant 38 yo woman with a h/o SVT s/p ablation of AVNRT. She had an episode of palpitations but has not had any documented recurrent SVT since her ablation 2 years ago. 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 She has many questions and comments today. Sounds like her biggest problem is not sleeping well. She has an APP on her phone that suggests she is not getting much deep sleep and this is quite concerning to her. She has not had any documented SVT. Her palpitations are much improved with a beta blocker. She thinks her PVC's are better with Mg supplementation. Allergies  Allergen Reactions   Metoprolol  Other (See Comments)    dizziness   Cephalexin Diarrhea   Cipro [Ciprofloxacin Hcl] Palpitations   Prednisone Palpitations   Sulfamethoxazole -Trimethoprim  Diarrhea     Current Outpatient Medications  Medication Sig Dispense Refill   acetaminophen  (TYLENOL ) 325 MG tablet Take 2 tablets (650 mg total) by mouth every 6 (six) hours as needed. 36 tablet 0   albuterol  (VENTOLIN  HFA) 108 (90 Base) MCG/ACT inhaler Inhale 1-2 puffs into the lungs every 6 (six) hours as needed for wheezing or shortness of breath. 1 each 0   ALPRAZolam  (XANAX  XR) 0.5 MG 24 hr tablet Take by mouth.     ALPRAZolam  (XANAX  XR) 1 MG 24 hr tablet Take 1 mg by mouth as needed.     Ascorbic Acid (VITAMIN C) 1000 MG tablet Take 1,000 mg by mouth daily.     atenolol  (TENORMIN ) 25 MG tablet Take 50 mg (2 tablets) in the morning and 25 mg (1 tablet) in the evening 270 tablet 3   b complex vitamins capsule Take 1 capsule by mouth daily.     baclofen (LIORESAL) 10 MG tablet Take 1 tablet by mouth 3 (three) times daily as needed.     cetirizine (ZYRTEC) 10 MG tablet Take 10 mg by mouth 2 (two) times daily.     clobetasol (TEMOVATE) 0.05 % external solution Apply 1 Application topically at bedtime. Apply to scalp     Digestive Enzymes CAPS Take 1 capsule by mouth 2 (two) times daily  before a meal.     ferrous sulfate  325 (65 FE) MG EC tablet Take 1 tablet (325 mg total) by mouth daily with breakfast. 30 tablet 6   fluticasone (FLONASE) 50 MCG/ACT nasal spray Place 1 spray into both nostrils daily as needed for allergies or rhinitis.     gabapentin (NEURONTIN) 300 MG capsule Take 300 mg by mouth as needed.     ibuprofen  (ADVIL ) 600 MG tablet Take 1 tablet (600 mg total) by mouth every 6 (six) hours as needed. 30 tablet 0   levothyroxine  (SYNTHROID ) 100 MCG tablet Take 100 mcg by mouth every morning.     Magnesium  400 MG CAPS Take 400 mg by mouth daily as needed (Low Magnesium ).     OVER THE COUNTER MEDICATION Take 2 tablets by mouth daily. Medication: Beet Root Supplement     potassium chloride  SA (KLOR-CON  M) 20 MEQ tablet Take 1 tablet (20 mEq total) by mouth daily. 30 tablet 6   Probiotic Product (PROBIOTIC DAILY PO) Take 2 each by mouth at bedtime.     QVAR REDIHALER 40 MCG/ACT inhaler Inhale 2 puffs into the lungs at bedtime.     TURMERIC CURCUMIN PO Take 2 capsules by mouth daily.     tiZANidine  (ZANAFLEX ) 4 MG tablet Take 1 tablet (4 mg  total) by mouth every 6 (six) hours as needed for muscle spasms. 30 tablet 6   No current facility-administered medications for this visit.     Past Medical History:  Diagnosis Date   Abortion 11/14/2021   Anxiety    Asthma    Frequent headaches    Hashimoto's disease    Hypertension    Hypothyroid    Lupus (systemic lupus erythematosus) (HCC)    SVT (supraventricular tachycardia) (HCC)     ROS:   All systems reviewed and negative except as noted in the HPI.   Past Surgical History:  Procedure Laterality Date   CESAREAN SECTION     x2   LAPAROSCOPIC APPENDECTOMY N/A 12/14/2016   Procedure: APPENDECTOMY LAPAROSCOPIC;  Surgeon: Curvin Deward MOULD, MD;  Location[facility] OR;  Service: General;  Laterality: N/A;   SVT ABLATION N/A 03/31/2022   Procedure: SVT ABLATION;  Surgeon: Waddell Danelle ORN, MD;  Location: [facility];  Service: Cardiovascular;  Laterality: N/A;     Family History  Problem Relation Age of Onset   Hypertension Mother    Hyperthyroidism Mother      Social History   Socioeconomic History   Marital status: Single    Spouse name: Not on file   Number of children: 3   Years of education: Not on file   Highest education level: Associate degree: academic program  Occupational History   Not on file  Tobacco Use   Smoking status: Never   Smokeless tobacco: Never  Vaping Use   Vaping status: Never Used  Substance and Sexual Activity   Alcohol use: Not Currently    Comment: Rare   Drug use: Never   Sexual activity: Yes  Other Topics Concern   Not on file  Social History Narrative   Caffeine- none   Social Drivers of Health   Financial Resource Strain: Low Risk  (02/06/2024)   Received from [facility]   Overall Financial Resource Strain (CARDIA)    Difficulty of Paying Living Expenses: Not very hard  Food Insecurity: No Food Insecurity (02/06/2024)   Received from [facility]   Hunger Vital Sign    Within the past 12 months, you worried that your food would run out before you got the money to buy more.: Never true    Within the past 12 months, the food you bought just didn't last and you didn't have money to get more.: Never true  Transportation Needs: No Transportation Needs (02/06/2024)   Received from [facility] - Transportation    Lack of Transportation (Medical): No    Lack of Transportation (Non-Medical): No  Physical Activity: Sufficiently Active (02/06/2024)   Received from [facility]   Exercise Vital Sign    On average, how many days per week do you engage in moderate to strenuous exercise (like a brisk walk)?: 5 days    On average, how many minutes do you engage in exercise at this level?: 60 min  Stress: Stress Concern Present (02/06/2024)   Received from [facility] of Occupational Health - Occupational Stress  Questionnaire    Feeling of Stress : To some extent  Social Connections: Moderately Integrated (02/06/2024)   Received from [facility]   Social Network    How would you rate your social network (family, work, friends)?: Adequate participation with social networks  Intimate Partner Violence: Not At Risk (02/06/2024)   Received from [facility]   HITS  Over the last 12 months how often did your partner physically hurt you?: Never    Over the last 12 months how often did your partner insult you or talk down to you?: Never    Over the last 12 months how often did your partner threaten you with physical harm?: Never    Over the last 12 months how often did your partner scream or curse at you?: Never     BP 112/76   Pulse 80   Ht 5' 9 (1.753 m)   Wt 245 lb 9.6 oz (111.4 kg)   SpO2 98%   BMI 36.27 kg/m   Physical Exam:  Well appearing NAD HEENT: Unremarkable Neck:  No JVD, no thyromegally Lymphatics:  No adenopathy Back:  No CVA tenderness Lungs:  Clear with no wheezes HEART:  Regular rate rhythm, no murmurs, no rubs, no clicks Abd:  soft, positive bowel sounds, no organomegally, no rebound, no guarding Ext:  2 plus pulses, no edema, no cyanosis, no clubbing Skin:  No rashes no nodules Neuro:  CN II through XII intact, motor grossly intact  Assess/Plan: Palpitations - these are improved. I asked her to try again to slowly wean off the toprol  and I have encouraged this by reducing her a.m. dose. She will continue her Mg.

## 2024-03-09 IMAGING — CR DG CHEST 2V
2 series · 2 of 2 positions shown · non-contrast
Comparison: PA Lat 09/29/2021

CLINICAL DATA: Chest pain, tachycardia and shortness of breath.

EXAM:
CHEST - 2 VIEW

[chest pa]
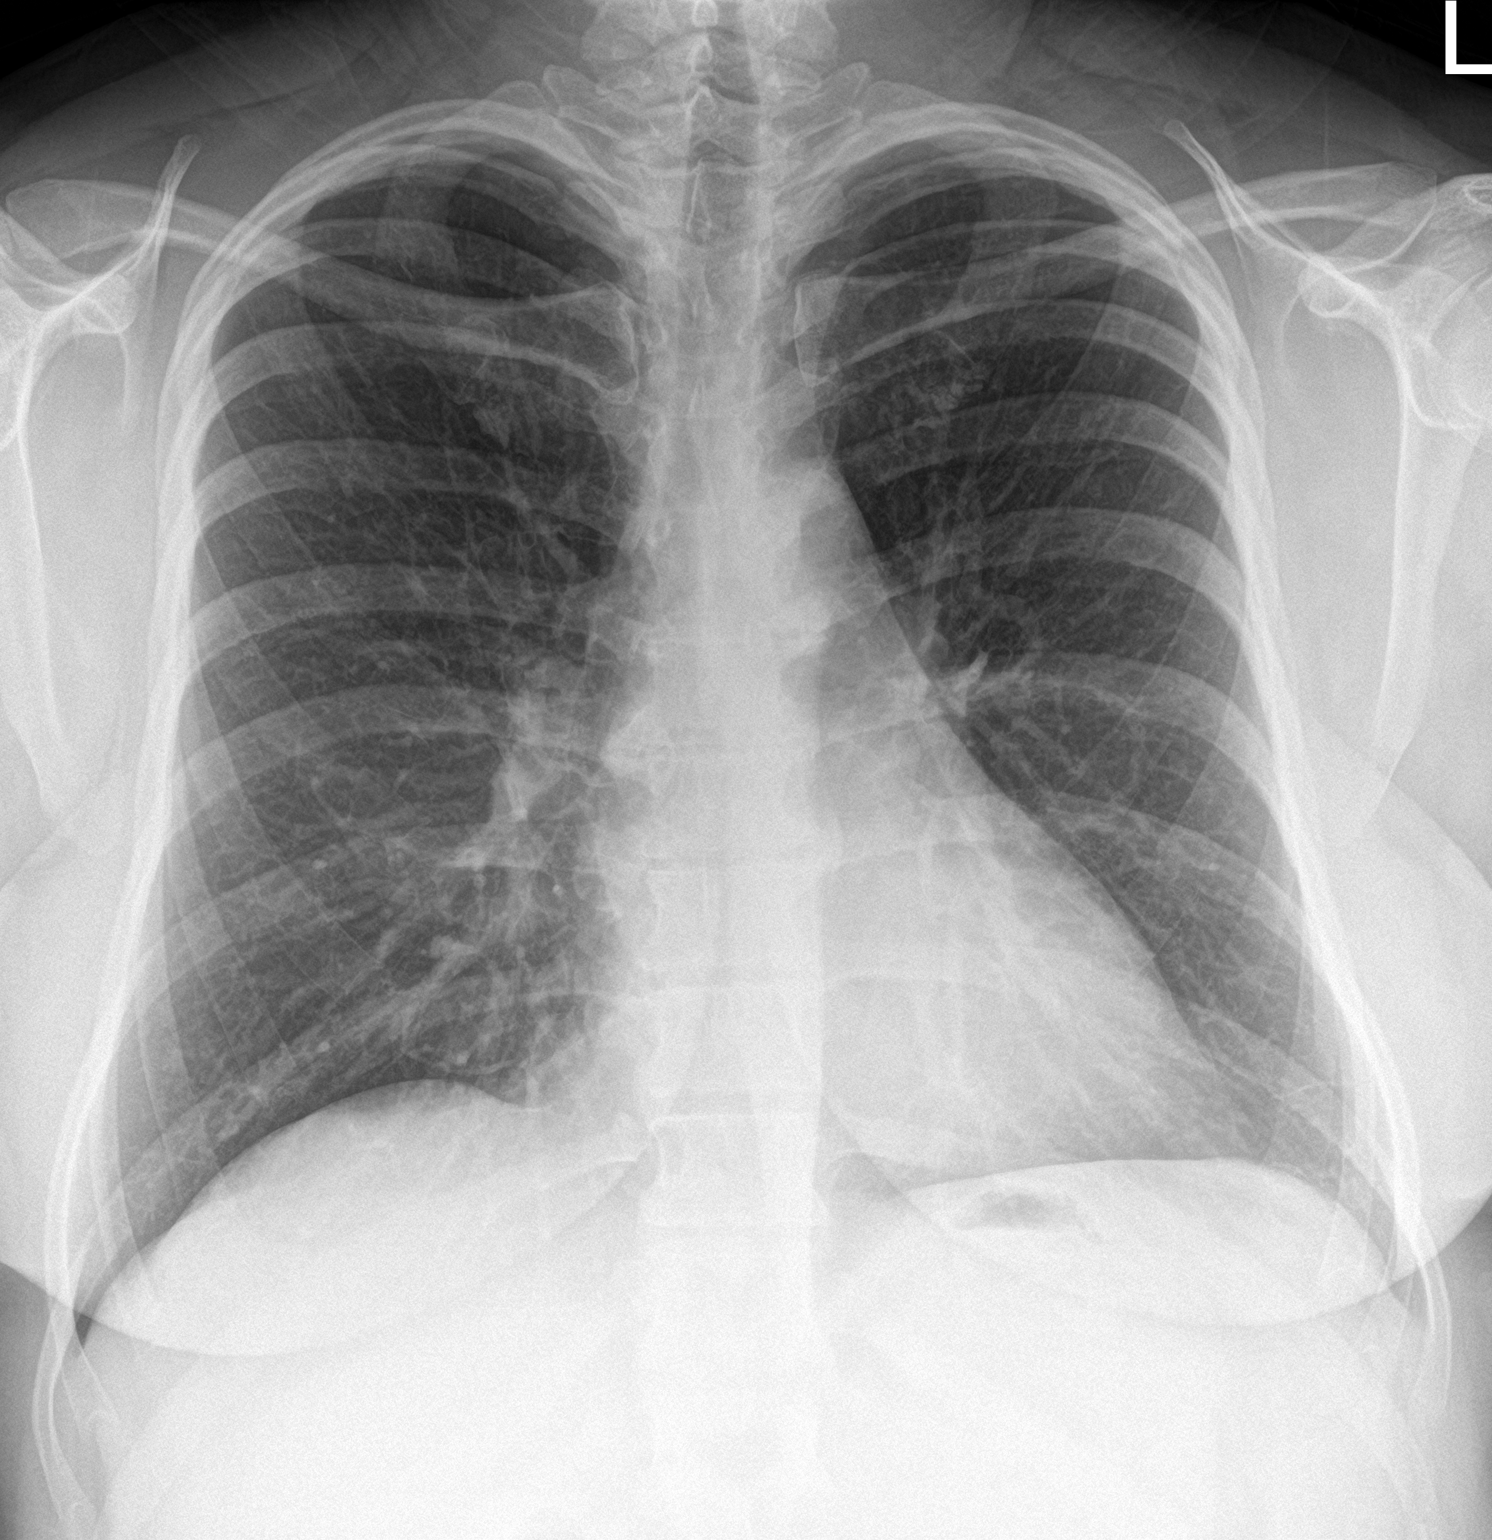

[chest lat]
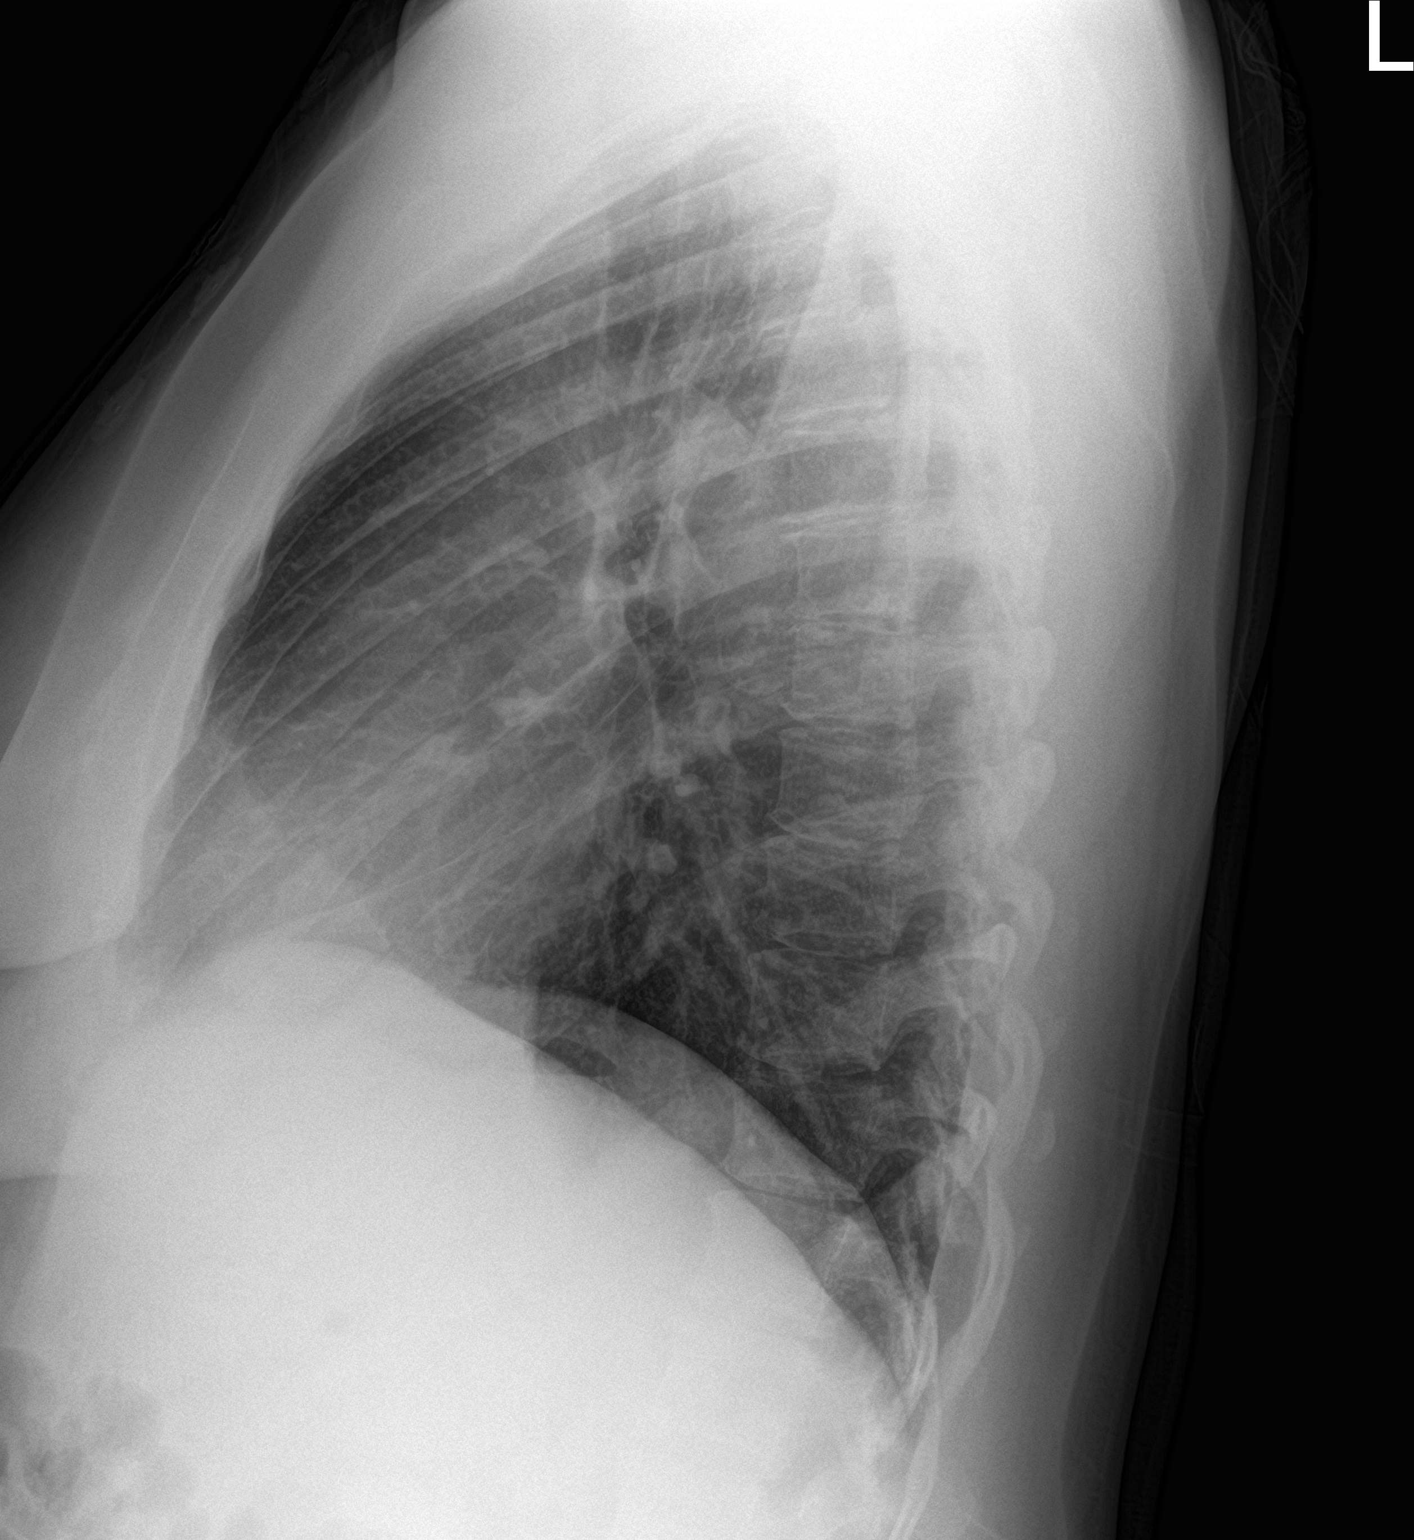

[2 of 2 positions shown; findings below may reference images not displayed]

FINDINGS: The heart size and mediastinal contours are within normal limits.
Both lungs are hyperinflated but clear. The visualized skeletal
structures are unremarkable.
IMPRESSION: No active cardiopulmonary disease.  Stable hyperinflated chest.

## 2024-03-10 IMAGING — MR MR HEAD W/O CM
10 of 11 series · 42 of 48 positions shown · non-contrast
Comparison: None.

CLINICAL DATA: Increasing tachycardia with chest pain. Left-sided
unilateral weakness since January 2021.

EXAM:
MRI HEAD WITHOUT CONTRAST
TECHNIQUE: Multiplanar, multiecho pulse sequences of the brain and surrounding
structures were obtained without intravenous contrast.

[Series 5: DWI · axial · 3.0mm · 0.88mm/px · z∈[-96,+43]mm · 9 of 96 slices shown (1 of 4)]
[im 1/96]
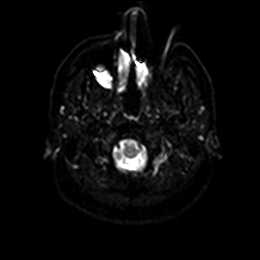
[im 12/96]
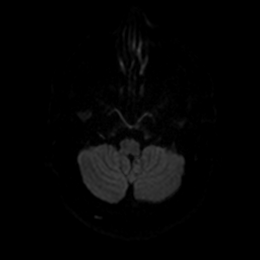
[im 24/96]
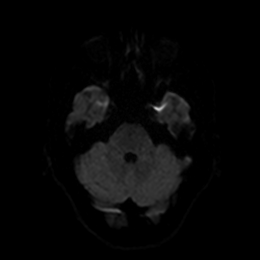
[im 36/96]
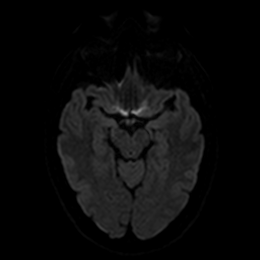
[im 48/96]
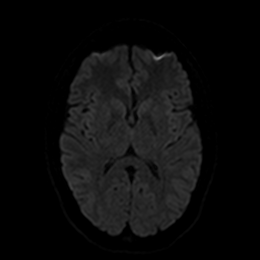
[im 60/96]
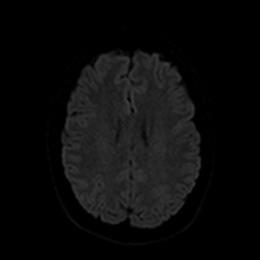
[im 72/96]
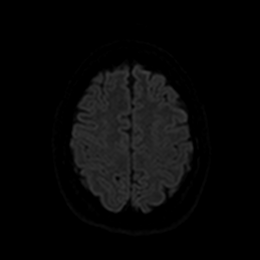
[im 84/96]
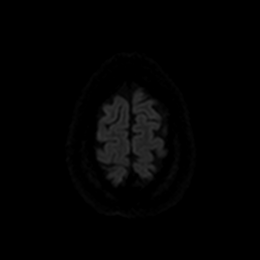
[im 96/96]
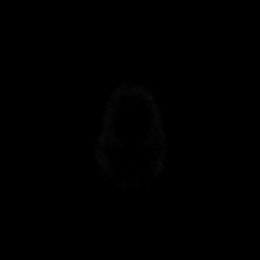

[Series 6: DWI · axial · 3.0mm · 0.88mm/px · z∈[-96,+43]mm · 4 of 48 slices shown (2 of 4)]
[im 1/48]
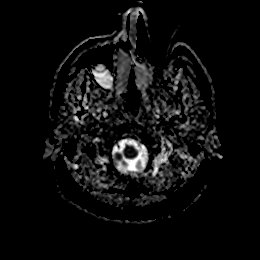
[im 16/48]
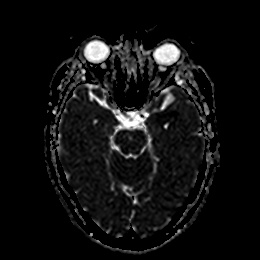
[im 32/48]
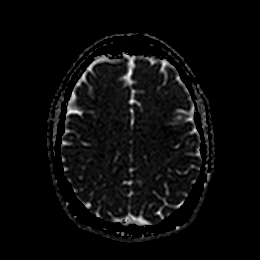
[im 48/48]
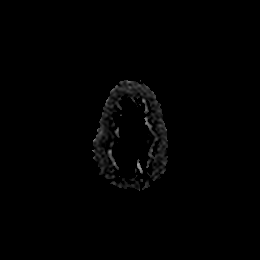

[Series 7: DWI · coronal · 4.0mm · 0.88mm/px · 6 of 64 slices shown (3 of 4)]
[im 1/64]
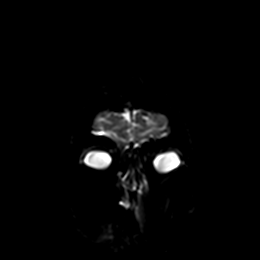
[im 13/64]
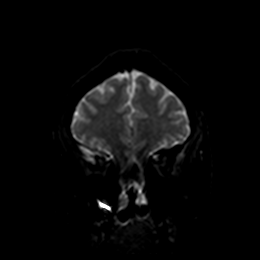
[im 26/64]
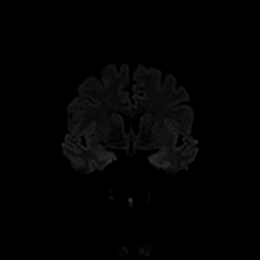
[im 38/64]
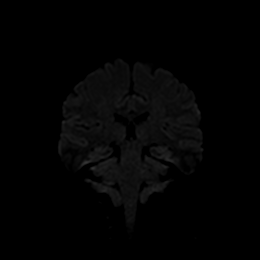
[im 51/64]
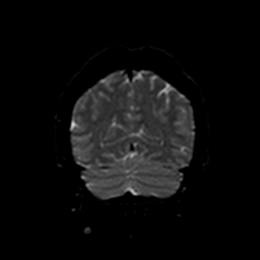
[im 64/64]
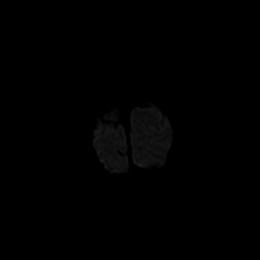

[Series 8: DWI · coronal · 4.0mm · 0.88mm/px · 3 of 32 slices shown (4 of 4)]
[im 1/32]
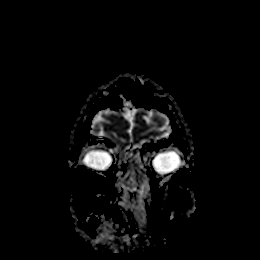
[im 16/32]
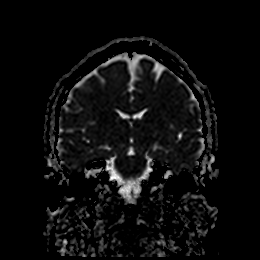
[im 32/32]
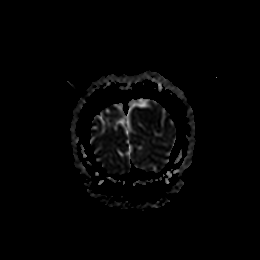

[Series 9: T1 · sagittal · 5.0mm · 0.75mm/px · 2 of 23 slices shown]
[im 1/23]
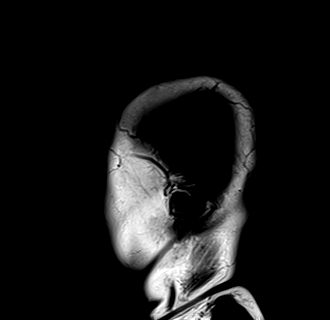
[im 23/23]
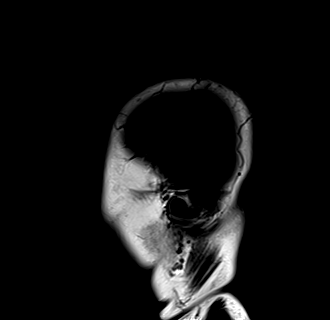

[Series 10: T2 · axial · 5.0mm · 0.72mm/px · z∈[-97,+46]mm · 2 of 25 slices shown (1 of 2)]
[im 1/25]
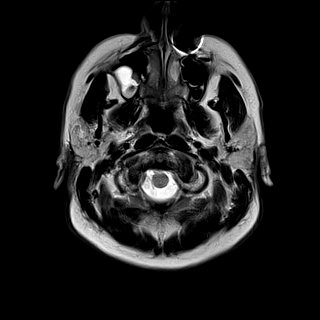
[im 25/25]
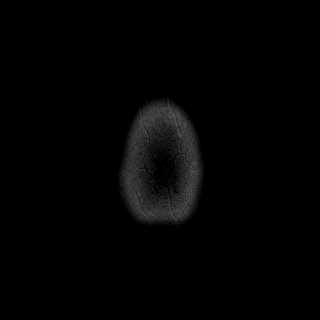

[Series 11: FLAIR · axial · 5.0mm · 0.45mm/px · z∈[-98,+44]mm · 2 of 25 slices shown]
[im 1/25]
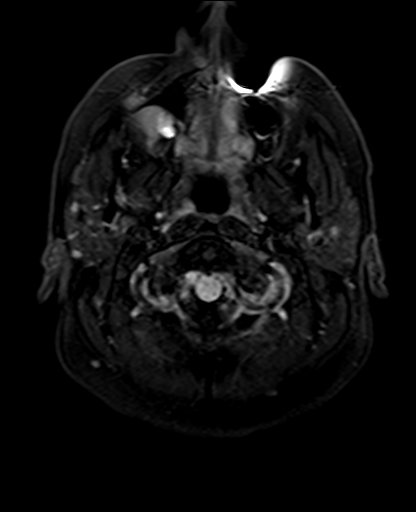
[im 25/25]
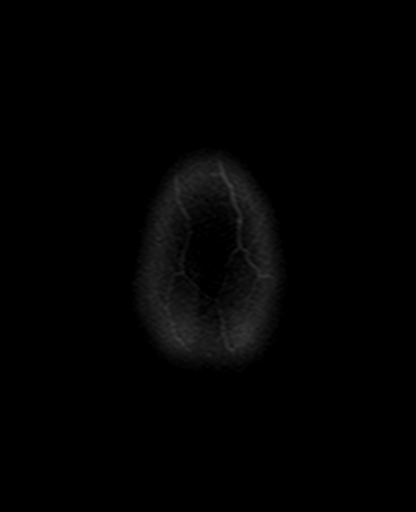

[Series 13: pha_images · axial · 3.0mm · 0.90mm/px · z∈[-115,+49]mm · 5 of 56 slices shown]
[im 1/56]
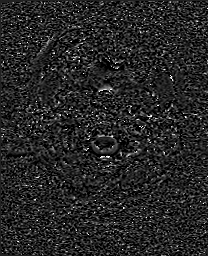
[im 14/56]
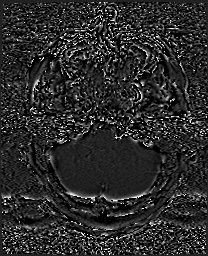
[im 28/56]
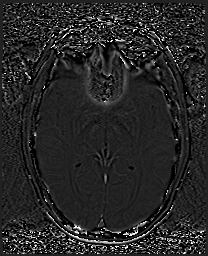
[im 42/56]
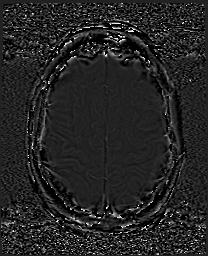
[im 56/56]
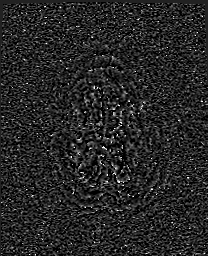

[Series 14: swi_images · axial · 3.0mm · 0.90mm/px · z∈[-115,+61]mm · 6 of 60 slices shown]
[im 1/60]
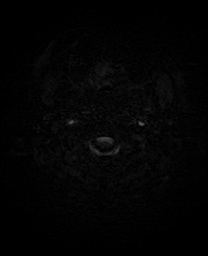
[im 12/60]
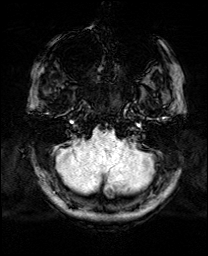
[im 24/60]
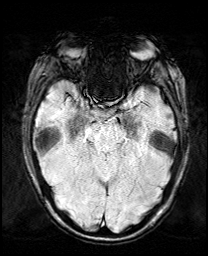
[im 36/60]
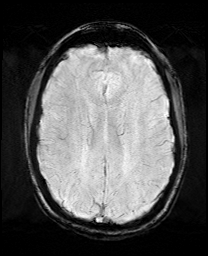
[im 48/60]
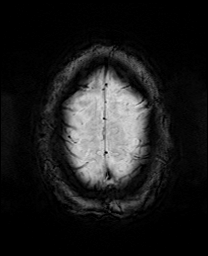
[im 60/60]
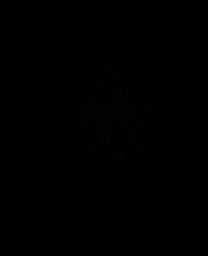

[Series 17: T2 · coronal · 5.0mm · 0.34mm/px · 3 of 29 slices shown (2 of 2)]
[im 1/29]
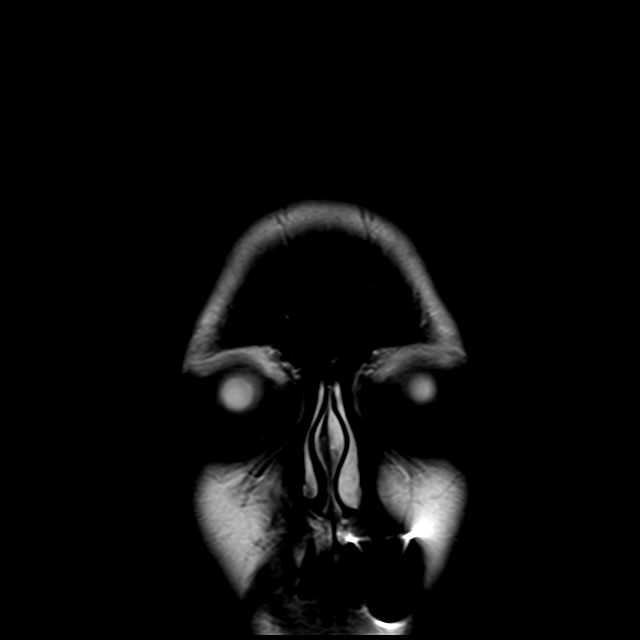
[im 15/29]
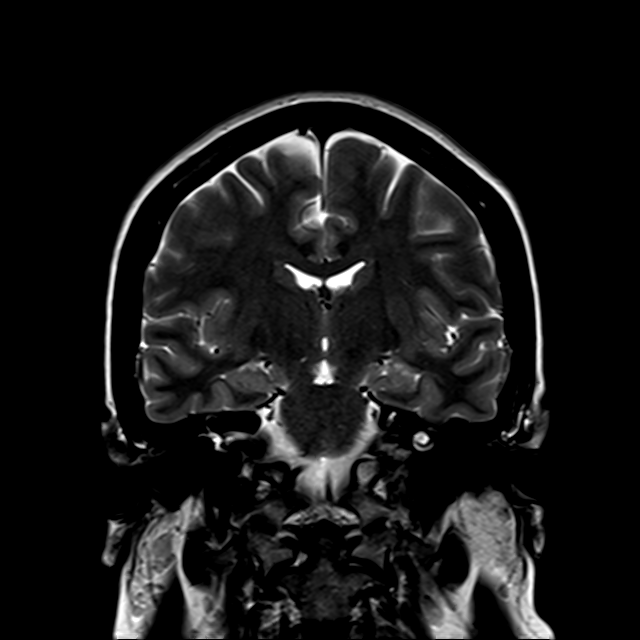
[im 29/29]
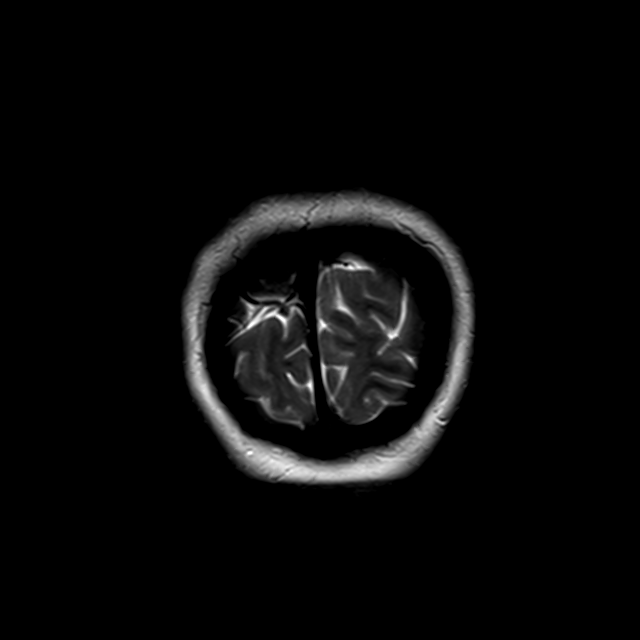

[42 of 48 positions shown; findings below may reference images not displayed]

FINDINGS: Brain: No infarction, hemorrhage, hydrocephalus, extra-axial
collection or mass lesion. No white matter disease or brain atrophy

Vascular: Normal flow voids.

Skull and upper cervical spine: Retention cysts in the inferior
right maxillary sinus.

Sinuses/Orbits: Negative
IMPRESSION: Normal MRI of the brain.

## 2024-03-20 ENCOUNTER — Encounter: Payer: Self-pay | Admitting: Internal Medicine

## 2024-03-20 ENCOUNTER — Ambulatory Visit (INDEPENDENT_AMBULATORY_CARE_PROVIDER_SITE_OTHER): Admitting: Internal Medicine

## 2024-03-20 VITALS — BP 120/80 | HR 84 | Ht 69.0 in | Wt 244.0 lb

## 2024-03-20 DIAGNOSIS — R5383 Other fatigue: Secondary | ICD-10-CM

## 2024-03-20 DIAGNOSIS — E010 Iodine-deficiency related diffuse (endemic) goiter: Secondary | ICD-10-CM | POA: Diagnosis not present

## 2024-03-20 DIAGNOSIS — E063 Autoimmune thyroiditis: Secondary | ICD-10-CM

## 2024-03-20 NOTE — Progress Notes (Addendum)
 Patient ID: Sheri Johnson, female   DOB: 15-Nov-1985, 38 y.o.   MRN: [id number]  HPI  Sheri Johnson is a 38 y.o.-year-old female, referred by her PCP, Juliane Che, PA, for management of Hashimoto's hypothyroidism.   Patient saw Dr. Beryl before last visit 06/2023.  Pt. was dx with hypothyroidism in 2017 when she experienced weight gain and mild fatigue.  She was started on levothyroxine  then.  The dose was fluctuating due to variable thyroid  tests.  At today's visit, she mentions: - She was diagnosed with POTS and developed SVT 3 years ago, for which she had an ablation and she is also on beta-blockers.  No significant palpitations lately. She is trying to decrease the dose slowly, currently on atenolol  25 mg twice a day.  She is also taking many electrolyte supplements. - Heavy menses and feeling drained especially during menstrual cycles.  She tried oral contraceptives before, but she could not tolerate them due to SVT (seen by Dr. Waddell) - She feels that she is losing potassium (he had a low potassium previously) and possibly other electrolytes during her menstrual cycles. She is also taking a multivitamin and a B complex.  Of note, an aldosterone/renin ratio was not elevated on 10/15/2023 (6.6/3.159 for a potassium of 4.2) - She has iron deficiency and is taking the iron supplement. - She has seen rheumatology for a positive ANA.  No clear rheumatologic disorder was found but she was told that she may have fibromyalgia.  She started to improve her diet, now low-carb, gluten-free.  Her GERD improved after she changed her diet so she could come off the PPI and H2 blocker. - She is very frustrated about inability to lose weight despite the above diet and also exercising 1.5 to 2 hours a day.  She feels that this may be the root problems, as she reportedly gained 40 pounds in the last 3 years. - she has hair loss and rash on her face and chest, for which she saw dermatology.  She was recommended  clobetasol cream, but this is not helping.  She also tried minoxidil without results.  She suspects that this may be related to her Hashimoto's hypothyroidism, however, she was told by the dermatologist that this is not the case. - she complains of numbness and also tremors in her arms and legs.  She saw neurology and Parkinson disease was ruled out - She feels a lump in her neck and has occasional dysphagia.  She did not have imaging of her thyroid  and wonders if this could be done. - She also mentions fatigue and a sensation of being drained even between menstrual cycles  Pt. is on levothyroxine  100 mcg daily, taken: - in am - fasting - at least 2h from b'fast - no calcium  - + iron 65 mg daily at 3 pm - + multivitamins at night - stopped PPIs (Protonix ), also H2 blockers (Pepcid ) - as GERD improved after changing diet - She is on a B complex - w/o Biotin - On digestive enzymes and probiotics  She was advised to increase the dose 112 mcg >> did not increase b/c of previous  palpitations with >100 mcg daily.  I reviewed pt's thyroid  tests:  08/07/2023: TSH 3.021 Lab Results  Component Value Date   TSH 8.905 (H) 07/21/2023   TSH 10.500 (H) 03/14/2023   TSH 7.761 (H) 04/21/2022   TSH 4.704 (H) 02/11/2022   TSH 10.935 (H) 01/30/2022   TSH 10.755 (H) 01/25/2022   FREET4 1.04  04/21/2022   FREET4 0.68 01/30/2022   Antithyroid antibodies: No results found for: THGAB No components found for: TPOAB  She has + FH of thyroid disorders in: Mother. No FH of thyroid cancer. No h/o radiation tx to head or neck. No recent use of iodine supplements.  No herbal supplements.  No recent steroid use.  Pt. also has a history of GAD - panic attacks, migraines, cervical radiculopathy, low back pain, polyarthralgia, TMJ, constipation, asthma, globus sensation.  She has a complicated 3rd pregnancy (04/2013 - HELLP). She was pregnant again earlier this year, but OB/GYN suggested termination 2/2 SVT  and the uncontrolled hypothyroidism.  Also, per review from Dr. Karoline note: PMH: Acute appendicitis 12/14/2016  Allergic rhinitis 12/01/2015  ASCUS with positive high risk HPV cervical 12/06/2017  Asthma 12/01/2015  Depression  Depressive disorder 12/01/2015  Essential hypertension 07/05/2018  Opioid dependence (*) 07/05/2018  Panic attack 02/03/2016  Pre-eclampsia 07/05/2018  Previous cesarean delivery, delivered 07/05/2018  SVT (supraventricular tachycardia) (*) 01/2021  Tachycardia  Thrombocytopenic disorder (*) 07/05/2018   Past Surgical History:  Procedure Laterality Date  Appendectomy 12/2016  Cesarean section  Colonoscopy 08/20/2020   ROS: + see HPI  Past Medical History:  Diagnosis Date   Abortion 11/14/2021   Anxiety    Asthma    Frequent headaches    Hashimoto's disease    Hypertension    Hypothyroid    Lupus (systemic lupus erythematosus) (HCC)    SVT (supraventricular tachycardia) (HCC)    Past Surgical History:  Procedure Laterality Date   CESAREAN SECTION     x2   LAPAROSCOPIC APPENDECTOMY N/A 12/14/2016   Procedure: APPENDECTOMY LAPAROSCOPIC;  Surgeon: Curvin Deward MOULD, MD;  Location: [facility];  Service: General;  Laterality: N/A;   SVT ABLATION N/A 03/31/2022   Procedure: SVT ABLATION;  Surgeon: Waddell Danelle ORN, MD;  Location: [facility];  Service: Cardiovascular;  Laterality: N/A;   Social History   Socioeconomic History   Marital status: Single    Spouse name: Not on file   Number of children: 3   Years of education: Not on file   Highest education level: Associate degree: academic program  Occupational History   Not on file  Tobacco Use   Smoking status: Never   Smokeless tobacco: Never  Vaping Use   Vaping status: Never Used  Substance and Sexual Activity   Alcohol use: Not Currently    Comment: Rare   Drug use: Never   Sexual activity: Yes  Other Topics Concern   Not on file  Social History Narrative   Caffeine- none    Social Drivers of Health   Financial Resource Strain: Low Risk  (02/06/2024)   Received from [facility]   Overall Financial Resource Strain (CARDIA)    Difficulty of Paying Living Expenses: Not very hard  Food Insecurity: No Food Insecurity (02/06/2024)   Received from [facility]   Hunger Vital Sign    Within the past 12 months, you worried that your food would run out before you got the money to buy more.: Never true    Within the past 12 months, the food you bought just didn't last and you didn't have money to get more.: Never true  Transportation Needs: No Transportation Needs (02/06/2024)   Received from [facility] - Transportation    Lack of Transportation (Medical): No    Lack of Transportation (Non-Medical): No  Physical Activity: Sufficiently Active (02/06/2024)   Received from [facility]  Exercise Vital Sign    On average, how many days per week do you engage in moderate to strenuous exercise (like a brisk walk)?: 5 days    On average, how many minutes do you engage in exercise at this level?: 60 min  Stress: Stress Concern Present (02/06/2024)   Received from [facility] - Occupational Stress Questionnaire    Feeling of Stress : To some extent  Social Connections: Moderately Integrated (02/06/2024)   Received from [facility]   Social Network    How would you rate your social network (family, work, friends)?: Adequate participation with social networks  Intimate Partner Violence: Not At Risk (02/06/2024)   Received from [facility]   HITS    Over the last 12 months how often did your partner physically hurt you?: Never    Over the last 12 months how often did your partner insult you or talk down to you?: Never    Over the last 12 months how often did your partner threaten you with physical harm?: Never    Over the last 12 months how often did your partner scream or curse at you?: Never   Current Outpatient  Medications on File Prior to Visit  Medication Sig Dispense Refill   acetaminophen  (TYLENOL ) 325 MG tablet Take 2 tablets (650 mg total) by mouth every 6 (six) hours as needed. 36 tablet 0   albuterol  (VENTOLIN  HFA) 108 (90 Base) MCG/ACT inhaler Inhale 1-2 puffs into the lungs every 6 (six) hours as needed for wheezing or shortness of breath. 1 each 0   ALPRAZolam  (XANAX  XR) 0.5 MG 24 hr tablet Take by mouth.     ALPRAZolam  (XANAX  XR) 1 MG 24 hr tablet Take 1 mg by mouth as needed.     Ascorbic Acid (VITAMIN C) 1000 MG tablet Take 1,000 mg by mouth daily.     atenolol  (TENORMIN ) 25 MG tablet Take 50 mg (2 tablets) in the morning and 25 mg (1 tablet) in the evening 270 tablet 3   b complex vitamins capsule Take 1 capsule by mouth daily.     baclofen (LIORESAL) 10 MG tablet Take 1 tablet by mouth 3 (three) times daily as needed.     cetirizine (ZYRTEC) 10 MG tablet Take 10 mg by mouth 2 (two) times daily.     clobetasol (TEMOVATE) 0.05 % external solution Apply 1 Application topically at bedtime. Apply to scalp     Digestive Enzymes CAPS Take 1 capsule by mouth 2 (two) times daily before a meal.     ferrous sulfate  325 (65 FE) MG EC tablet Take 1 tablet (325 mg total) by mouth daily with breakfast. 30 tablet 6   fluticasone (FLONASE) 50 MCG/ACT nasal spray Place 1 spray into both nostrils daily as needed for allergies or rhinitis.     gabapentin (NEURONTIN) 300 MG capsule Take 300 mg by mouth as needed.     ibuprofen  (ADVIL ) 600 MG tablet Take 1 tablet (600 mg total) by mouth every 6 (six) hours as needed. 30 tablet 0   levothyroxine  (SYNTHROID ) 100 MCG tablet Take 100 mcg by mouth every morning.     Magnesium  400 MG CAPS Take 400 mg by mouth daily as needed (Low Magnesium ).     OVER THE COUNTER MEDICATION Take 2 tablets by mouth daily. Medication: Beet Root Supplement     potassium chloride  SA (KLOR-CON  M) 20 MEQ tablet Take 1 tablet (20 mEq total) by mouth  daily. 30 tablet 6   Probiotic Product  (PROBIOTIC DAILY PO) Take 2 each by mouth at bedtime.     QVAR REDIHALER 40 MCG/ACT inhaler Inhale 2 puffs into the lungs at bedtime.     TURMERIC CURCUMIN PO Take 2 capsules by mouth daily.     levothyroxine (SYNTHROID) 112 MCG tablet Take 112 mcg by mouth daily before breakfast. (Patient not taking: Reported on 03/20/2024)     tiZANidine (ZANAFLEX) 4 MG tablet Take 1 tablet (4 mg total) by mouth every 6 (six) hours as needed for muscle spasms. 30 tablet 6   No current facility-administered medications on file prior to visit.   Allergies  Allergen Reactions   Metoprolol Other (See Comments)    dizziness   Cephalexin Diarrhea   Cipro [Ciprofloxacin Hcl] Palpitations   Prednisone Palpitations   Sulfamethoxazole-Trimethoprim Diarrhea   Family History  Problem Relation Age of Onset   Hypertension Mother    Hyperthyroidism Mother    PE: BP 120/80 (BP Location: Left Arm, Patient Position: Sitting, Cuff Size: Normal)   Pulse 84   Ht 5' 9 (1.753 m)   Wt 244 lb (110.7 kg)   BMI 36.03 kg/m  Wt Readings from Last 20 Encounters:  03/20/24 244 lb (110.7 kg)  02/25/24 245 lb 9.6 oz (111.4 kg)  11/05/23 232 lb (105.2 kg)  08/15/23 236 lb 3.2 oz (107.1 kg)  07/21/23 238 lb 1.6 oz (108 kg)  06/30/23 238 lb (108 kg)  06/26/23 239 lb (108.4 kg)  06/08/23 230 lb (104.3 kg)  04/24/23 242 lb 12.8 oz (110.1 kg)  03/14/23 240 lb (108.9 kg)  02/12/23 247 lb 12.8 oz (112.4 kg)  01/16/23 240 lb (108.9 kg)  01/08/23 242 lb (109.8 kg)  01/03/23 240 lb (108.9 kg)  11/17/22 245 lb 3.2 oz (111.2 kg)  10/14/22 240 lb (108.9 kg)  10/10/22 242 lb 6.4 oz (110 kg)  10/09/22 245 lb (111.1 kg)  08/05/22 238 lb (108 kg)  07/27/22 245 lb 6.4 oz (111.3 kg)   Constitutional: overweight, in NAD, + rosacea on face and chest Eyes:  EOMI, no exophthalmos ENT: + Thyromegaly, no cervical lymphadenopathy Cardiovascular: RRR, No MRG Respiratory: CTA B Musculoskeletal: no deformities Skin:no  rashes Neurological: no tremor with outstretched hands  ASSESSMENT: 1.  Uncontrolled Hashimoto's hypothyroidism  2. Fatigue  PLAN:  1. Patient with long-standing hypothyroidism, on levothyroxine therapy. - we discussed about  Hashimoto thyroiditis  as an autoimmune disorder, in which the immune system attacks her own thyroid. The antibodies bind to the thyroid tissue and cause inflammation, and, eventually, destruction of the gland and hypothyroidism. We don't know how long this process can be, it can last from months to years.  In pts with a family member with Hashimoto's thyroiditis the risk for developing the disease is higher. I explained that thyroid enlargement especially at the beginning of Hashimoto thyroiditis course is not uncommon, and it has a waxing and waning character.  She does have thyromegaly on palpation of her neck today and she also has globus sensation - we can go ahead and check a thyroid ultrasound. - We discussed about treatment for Hashimoto thyroiditis which is actually limited to thyroid hormones in case the TFTs are abnormal.  - She is currently on levothyroxine 100 mcg daily and, upon questioning, we discussed about other methods of treatment, for example with desiccated thyroid hormones.  Her mother takes Armour and is very happy with it.  She is wondering whether she would be  a candidate for this.  We did discuss about benefits and side effects of Armour especially in her case, but she feels that her SVT is now well-controlled.  She does have anxiety and we discussed that the LT3 component of Armour may cause an increase anxiety.  We can try Armour, but I advised her to let me know if she has more palpitations or anxiety on it. - latest TSH was elevated few days ago at 5.81, previously normal, 3.021 in 07/2024 - we discussed about taking the thyroid hormone every day, with water, >30 minutes before breakfast, separated by >4 hours from acid reflux medications, calcium,  iron, multivitamins. Pt. is taking it correctly. - will check thyroid tests now - Otherwise, I will see her back in 3 months  2.  Fatigue  - This is associated with many generalized symptoms: - She does have POTS for which she is taking electrolytes and other supplements.  We also discussed about the dysautonomia clinic at [facility] - For heavy menses, we discussed about the possibility of using the NuvaRing and replace it every 3 months. - Her potassium levels may improve after reducing her menstrual cycles, since she mentions that it is worse during menstruation.  She is taking a multivitamin.  She had normal aldosterone and PRA in 10/2023 per review of [facility] records, so there is no suspicion for hyperaldosteronism. - She has a history of iron deficiency reportedly, for which she takes iron.  A CBC showed a normal hemoglobin when checked earlier in the year.  At today's visit we will check a ferritin.  However, this was normal when checked in 08/2023 per review of [facility] records. - Regarding her autoimmune condition, we will check her ATA and TPO antibodies.  Discussed about improving diet and given examples of foods that will decrease inflammation in the gut and therefore improve immune system. - Regarding weight gain, I am hoping that normalizing her thyroid tests can help with this.  She has been with an elevated TSH for quite a long time but now after her cardiac ablation, she may be able to tolerate a higher dose of thyroid medication - Regarding hair loss, we discussed about possibly trying minoxidil at the higher concentration, and possibly starting biotin 1000 mcg daily, however, will leave the ultimate management for dermatology.  I advised her to stay off biotin whenever we need to check thyroid tests. - She also mentions fatigue and a sensation of being drained even between menstrual cycles -she would like us  to evaluate her vitamins.  We can check B vitamins and also a vitamin D level today.  She would also be interested in checking her electrolytes, but I would defer to PCP.  Orders Placed This Encounter  Procedures   US  THYROID   TSH   T4, free   T3, free   Ferritin   Vitamin B12   Vitamin B1   Vitamin B6   Vitamin D, 25-hydroxy   Thyroglobulin antibody   Thyroid peroxidase antibody   - time spent for the visit: 1 hour, in precharting, post charting, reviewing her previous labs, imaging evaluations, office visit notes, and previous treatments, counseling her about her endocrine conditions (please see the discussed topics above), and developing a plan to investigate and treat them. She had a number of questions which I addressed.    Office Visit on 03/20/2024  Component Date Value Ref Range Status   TSH 03/20/2024 10.93 (H)  mIU/L Final   Comment:  Reference Range .           > or = 20 Years  0.40-4.50 .                Pregnancy Ranges           First trimester    0.26-2.66           Second trimester   0.55-2.73           Third trimester    0.43-2.91    Free T4 03/20/2024 0.9  0.8 - 1.8 ng/dL Final   T3, Free 91/85/7974 2.8  2.3 - 4.2 pg/mL Final   Ferritin 03/20/2024 15 (L)  16 - 154 ng/mL Final   Vitamin B-12 03/20/2024 884  200 - 1,100 pg/mL Final   Vit D, 25-Hydroxy 03/20/2024 38  30 - 100 ng/mL Final   Comment: Vitamin D Status         25-OH Vitamin D: . Deficiency:                    <20 ng/mL Insufficiency:             20 - 29 ng/mL Optimal:                 > or = 30 ng/mL . For 25-OH Vitamin D testing on patients on  D2-supplementation and patients for whom quantitation  of D2 and D3 fractions is required, the [employer]ure[facility](TM) 25-OH VIT D, (D2,D3), LC/MS/MS is recommended: order  code 07111 (patients >80yrs). . See Note 1 . Note 1 . For additional information, please refer to  http://education.[facility].com/faq/FAQ199  (This link is being provided for informational/ educational purposes only.)    Not all the labs are back,  however, vitamin D and B12 are normal. Ferritin is still low.  I will advise her to try to take the iron supplement every other day for better absorption. TSH is even higher than 10 days prior.  I will suggest to start with Armour 60 mg + half of the 15 mg of Armour, to make up the 112 mcg dose.  However, I will advised her to start with only 60 mg of Armour and add the extra dose in a week, if she tolerates it well.  Plan to have her back for repeat thyroid labs in 5 weeks.  Thyroid U/S (03/24/2024): Parenchymal Echotexture: Markedly heterogenous  Isthmus: 0.7 cm  Right lobe: 4.9 x 1.5 x 1.7 cm  Left lobe: 5.5 x 1.3 x 1.7 cm  _________________________________________________________   Estimated total number of nodules >/= 1 cm: 0 _________________________________________________________   No discrete nodules are seen within the thyroid gland.   IMPRESSION: Marked diffuse heterogeneity of thyroid parenchyma without discrete nodule.    Lela Fendt, MD PhD [facility] Endocrinology

## 2024-03-20 NOTE — Patient Instructions (Addendum)
 Please continue levothyroxine 100 mcg daily.  Take the thyroid hormone every day, with water, at least 30 minutes before breakfast, separated by at least 4 hours from: - acid reflux medications - calcium - iron - multivitamins  Please return to see me in 3 months.  Hypothyroidism  Hypothyroidism is when the thyroid gland does not make enough of certain hormones. This is called an underactive thyroid. The thyroid gland is a small gland located in the lower front part of the neck, just in front of the windpipe (trachea). This gland makes hormones that help control how the body uses food for energy (metabolism) as well as how the heart and brain function. These hormones also play a role in keeping your bones strong. When the thyroid is underactive, it produces too little of the hormones thyroxine (T4) and triiodothyronine (T3). What are the causes? This condition may be caused by: Hashimoto's disease. This is a disease in which the body's disease-fighting system (immune system) attacks the thyroid gland. This is the most common cause. Viral infections. Pregnancy. Certain medicines. Birth defects. Problems with a gland in the center of the brain (pituitary gland). Lack of enough iodine in the diet. Other causes may include: Past radiation treatments to the head or neck for cancer. Past treatment with radioactive iodine. Past exposure to radiation in the environment. Past surgical removal of part or all of the thyroid. What increases the risk? You are more likely to develop this condition if: You are female. You have a family history of thyroid conditions. You use a medicine called lithium. You take medicines that affect the immune system (immunosuppressants). What are the signs or symptoms? Common symptoms of this condition include: Not being able to tolerate cold. Feeling as though you have no energy (lethargy). Lack of appetite. Constipation. Sadness or depression. Weight gain  that is not explained by a change in diet or exercise habits. Menstrual irregularity. Dry skin, coarse hair, or brittle nails. Other symptoms may include: Muscle pain. Slowing of thought processes. Poor memory. How is this diagnosed? This condition may be diagnosed based on: Your symptoms, your medical history, and a physical exam. Blood tests. You may also have imaging tests, such as an ultrasound or MRI. How is this treated? This condition is treated with medicine that replaces the thyroid hormones that your body does not make. After you begin treatment, it may take several weeks for symptoms to go away. Follow these instructions at home: Take over-the-counter and prescription medicines only as told by your health care provider. If you start taking any new medicines, tell your health care provider. Keep all follow-up visits as told by your health care provider. This is important. As your condition improves, your dosage of thyroid hormone medicine may change. You will need to have blood tests regularly so that your health care provider can monitor your condition. Contact a health care provider if: Your symptoms do not get better with treatment. You are taking thyroid hormone replacement medicine and you: Sweat a lot. Have tremors. Feel anxious. Lose weight rapidly. Cannot tolerate heat. Have emotional swings. Have diarrhea. Feel weak. Get help right away if: You have chest pain. You have an irregular heartbeat. You have a rapid heartbeat. You have difficulty breathing. These symptoms may be an emergency. Get help right away. Call 911. Do not wait to see if the symptoms will go away. Do not drive yourself to the hospital. Summary Hypothyroidism is when the thyroid gland does not make enough of certain  hormones (it is underactive). When the thyroid is underactive, it produces too little of the hormones thyroxine (T4) and triiodothyronine (T3). The most common cause is  Hashimoto's disease, a disease in which the body's disease-fighting system (immune system) attacks the thyroid gland. The condition can also be caused by viral infections, medicine, pregnancy, or past radiation treatment to the head or neck. Symptoms may include weight gain, dry skin, constipation, feeling as though you do not have energy, and not being able to tolerate cold. This condition is treated with medicine to replace the thyroid hormones that your body does not make. This information is not intended to replace advice given to you by your health care provider. Make sure you discuss any questions you have with your health care provider. Document Revised: 07/26/2021 Document Reviewed: 07/26/2021 Elsevier Patient Education  2024 [employer].

## 2024-03-21 ENCOUNTER — Encounter: Payer: Self-pay | Admitting: Internal Medicine

## 2024-03-21 ENCOUNTER — Ambulatory Visit: Admitting: Internal Medicine

## 2024-03-21 ENCOUNTER — Ambulatory Visit: Payer: Self-pay | Admitting: Internal Medicine

## 2024-03-21 MED ORDER — ARMOUR THYROID 15 MG PO TABS: 7.5000 mg | ORAL_TABLET | Freq: Every day | ORAL | 1 refills | Status: DC

## 2024-03-21 MED ORDER — ARMOUR THYROID 60 MG PO TABS: 60.0000 mg | ORAL_TABLET | Freq: Every day | ORAL | 1 refills | Status: DC

## 2024-03-21 NOTE — Addendum Note (Signed)
 Addended by: TRIXIE FILE on: 03/21/2024 12:58 PM   Modules accepted: Orders

## 2024-03-24 ENCOUNTER — Inpatient Hospital Stay
Admission: RE | Admit: 2024-03-24 | Discharge: 2024-03-24 | Discharge status: Home / Self Care | Source: Ambulatory Visit | Attending: Internal Medicine | Admitting: Internal Medicine

## 2024-03-24 DIAGNOSIS — E010 Iodine-deficiency related diffuse (endemic) goiter: Secondary | ICD-10-CM

## 2024-03-27 LAB — THYROGLOBULIN ANTIBODY: Thyroglobulin Ab: 1 [IU]/mL (ref <=1)

## 2024-03-27 LAB — TSH: TSH: 10.93 m[IU]/L — ABNORMAL HIGH

## 2024-03-27 LAB — THYROID PEROXIDASE ANTIBODY: Thyroperoxidase Ab SerPl-aCnc: 264 [IU]/mL — ABNORMAL HIGH (ref <9)

## 2024-03-27 LAB — T4, FREE: Free T4: 0.9 ng/dL (ref 0.8–1.8)

## 2024-03-27 LAB — FERRITIN: Ferritin: 15 ng/mL — ABNORMAL LOW (ref 16–154)

## 2024-03-27 LAB — VITAMIN B1: Vitamin B1 (Thiamine): 158 nmol/L — ABNORMAL HIGH (ref 8–30)

## 2024-03-27 LAB — VITAMIN B12: Vitamin B-12: 884 pg/mL (ref 200–1100)

## 2024-03-27 LAB — VITAMIN D 25 HYDROXY (VIT D DEFICIENCY, FRACTURES): Vit D, 25-Hydroxy: 38 ng/mL (ref 30–100)

## 2024-03-27 LAB — VITAMIN B6: Vitamin B6: 94.2 ng/mL — ABNORMAL HIGH (ref 2.1–21.7)

## 2024-03-27 LAB — T3, FREE: T3, Free: 2.8 pg/mL (ref 2.3–4.2)

## 2024-03-28 ENCOUNTER — Ambulatory Visit: Admitting: Internal Medicine

## 2024-04-04 ENCOUNTER — Other Ambulatory Visit: Payer: Self-pay | Admitting: Internal Medicine

## 2024-04-04 ENCOUNTER — Encounter: Payer: Self-pay | Admitting: Internal Medicine

## 2024-04-04 MED ORDER — ARMOUR THYROID 15 MG PO TABS: 15.0000 mg | ORAL_TABLET | Freq: Every day | ORAL | 3 refills | Status: DC

## 2024-04-08 ENCOUNTER — Ambulatory Visit: Admitting: Cardiology

## 2024-04-08 ENCOUNTER — Other Ambulatory Visit: Payer: Self-pay | Admitting: Internal Medicine

## 2024-04-08 MED ORDER — LEVOTHYROXINE SODIUM 100 MCG PO TABS
100.0000 ug | ORAL_TABLET | Freq: Every day | ORAL | Status: DC
Start: 2024-04-08 — End: 2024-05-19

## 2024-04-08 MED ORDER — LIOTHYRONINE SODIUM 5 MCG PO TABS: 5.0000 ug | ORAL_TABLET | Freq: Every day | ORAL | 3 refills | Status: DC

## 2024-04-18 IMAGING — CT CT ABD-PELV W/ CM
3 of 11 series · 11 of 46 positions shown, 17 images · IV contrast (agent unspecified)
Comparison: December 14, 2016.

CLINICAL DATA: Abdominal pain, acute nonlocalized abdominal pain
and difficulty swallowing for 5 days. Also with suspected pulmonary
embolism given history of chest pain.



[Series 7: pe thins · axial · 0.81mm/px · z∈[+1194,+1332]mm · 5 of 276 slices shown]
[im 20/276  soft-tissue]
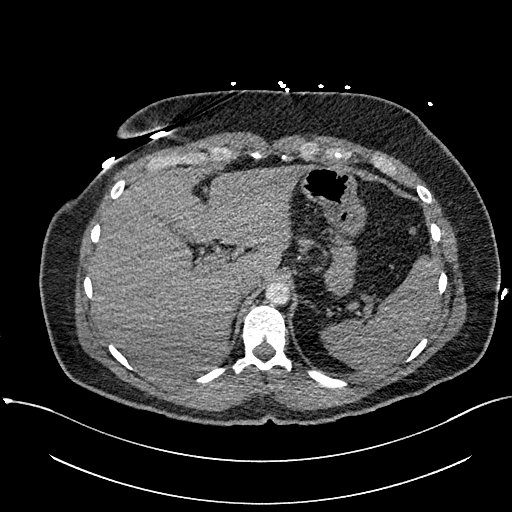
[im 59/276  soft-tissue]
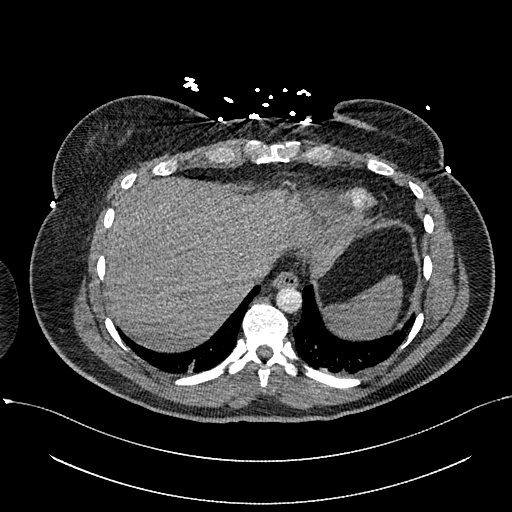
[im 99/276  soft-tissue]
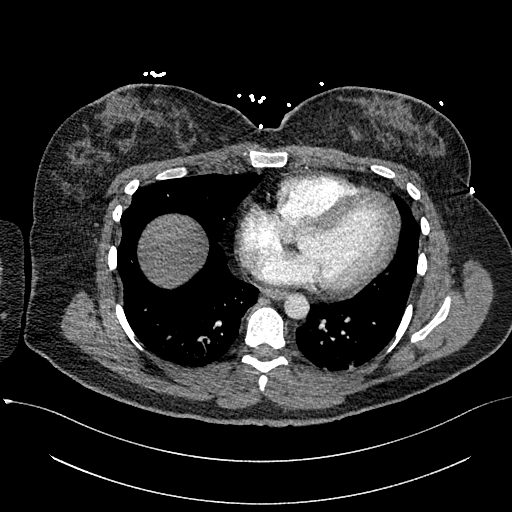
[im 118/276  soft-tissue]
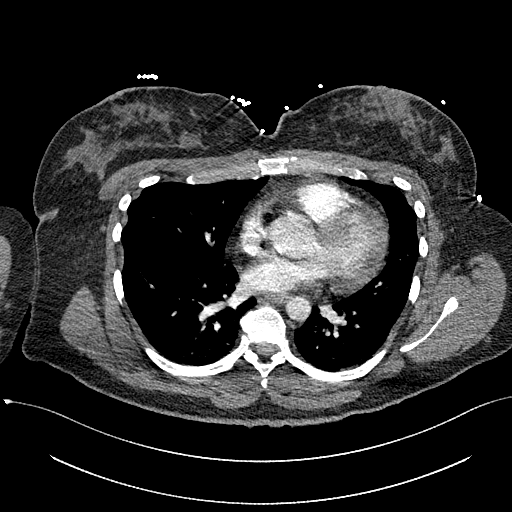
[im 158/276  soft-tissue]
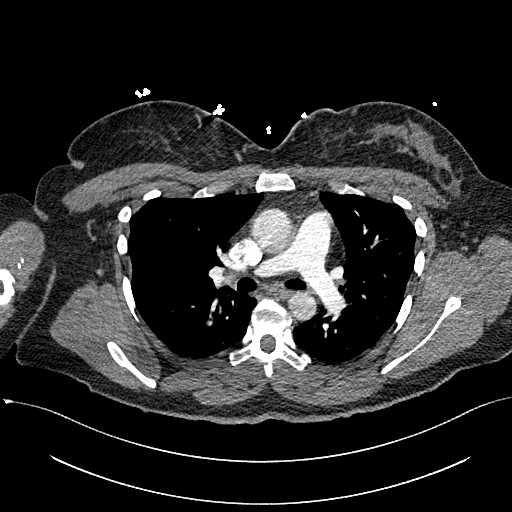

[Series 8: pe 2mm cor · coronal · 0.59mm/px · 2 of 151 slices shown, 3 images]
[im 51/151  soft-tissue]
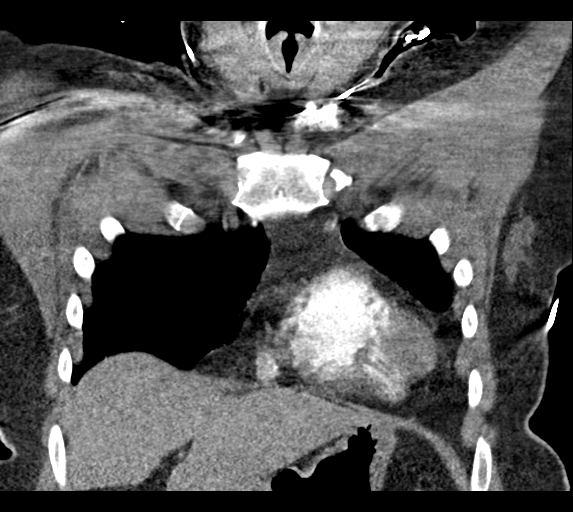
[im 51/151  bone]
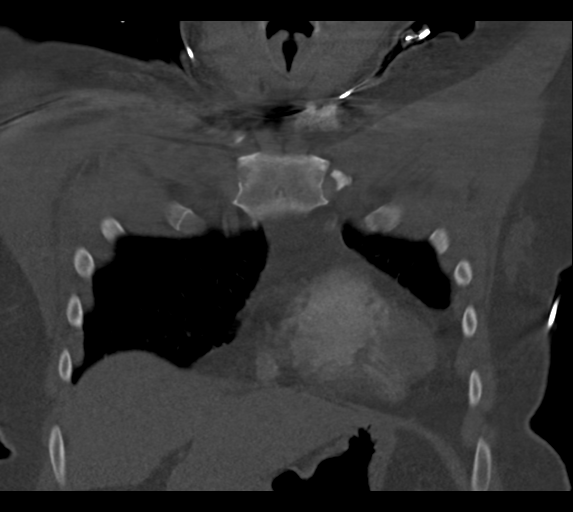
[im 101/151  soft-tissue]
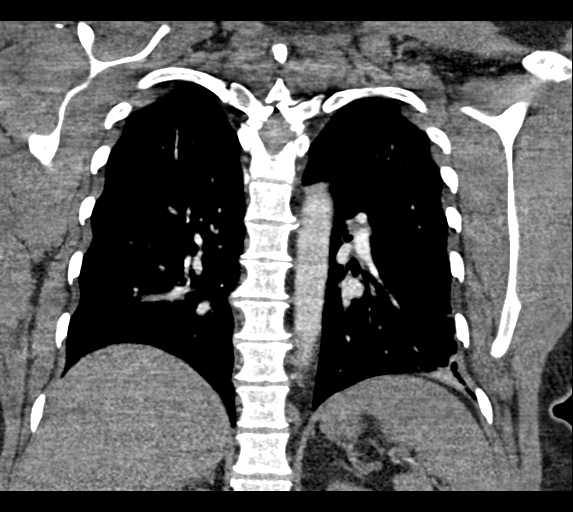

[Series 12: a/p w/ 5mm · axial · 0.86mm/px · z∈[+874,+1219]mm · 4 of 115 slices shown, 9 images]
[im 23/115  soft-tissue]
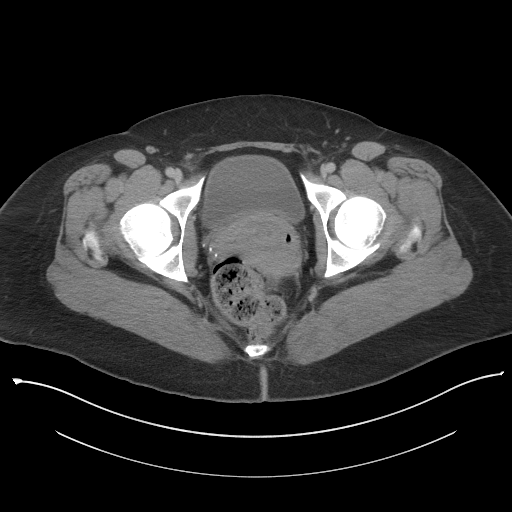
[im 23/115  lung]
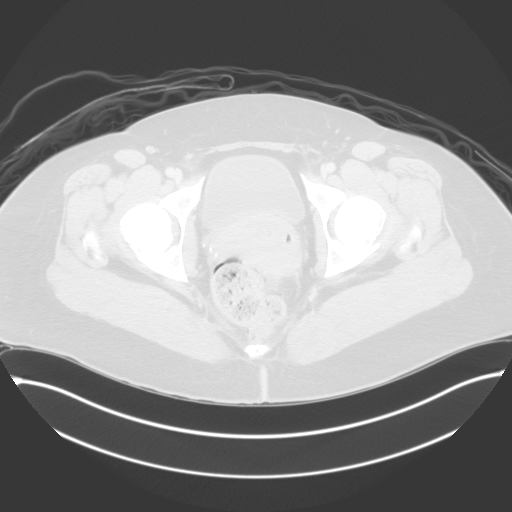
[im 23/115  bone]
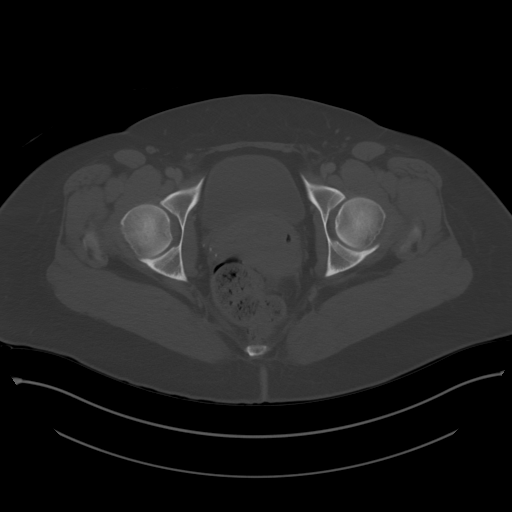
[im 46/115  soft-tissue]
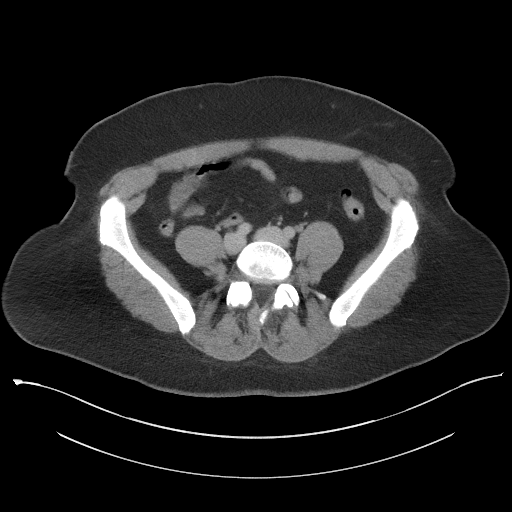
[im 46/115  lung]
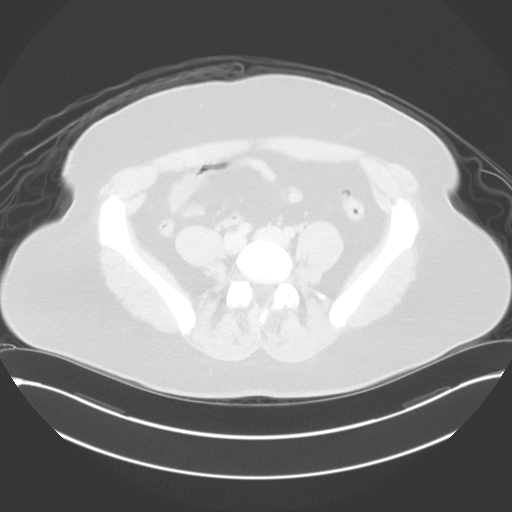
[im 69/115  soft-tissue]
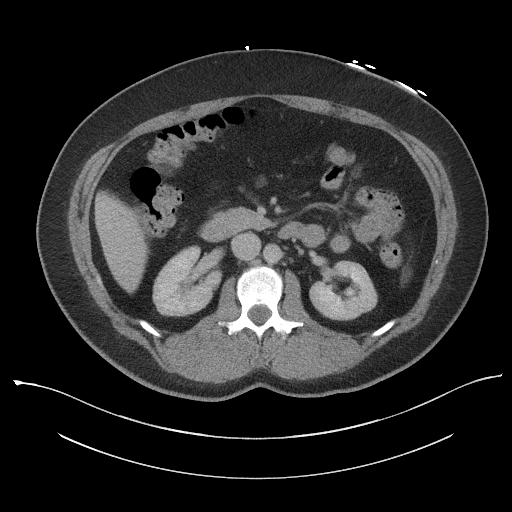
[im 69/115  lung]
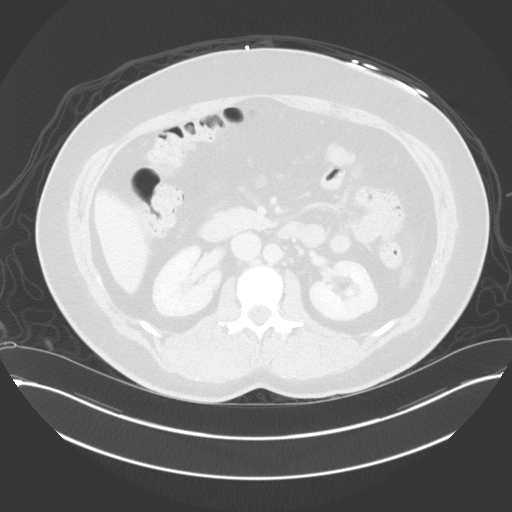
[im 92/115  soft-tissue]
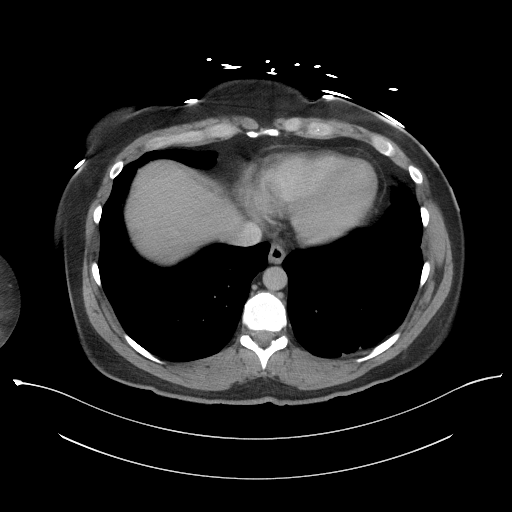
[im 92/115  lung]
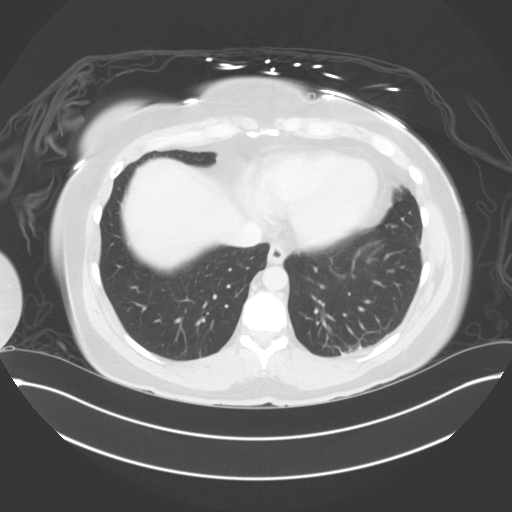

[11 of 46 positions shown; findings below may reference images not displayed]

RADIATION DOSE REDUCTION: This exam was performed according to the
departmental dose-optimization program which includes automated
exposure control, adjustment of the mA and/or kV according to
patient size and/or use of iterative reconstruction technique.

CONTRAST:  100mL OMNIPAQUE IOHEXOL 350 MG/ML SOLN
FINDINGS: CTA CHEST FINDINGS

Cardiovascular: Normal caliber of the thoracic aorta. Aorta not as
well assessed on venous phase due to optimization of bolus timing
for pulmonary arterial evaluation. Central pulmonary vascular
opacification is mild-to-moderately compromised at 234 Hounsfield
unit density. No signs of central or lobar level embolism. Artifact
from dense contrast in the SVC limiting assessment further in the
upper lobe on the RIGHT. Heart size is normal. No pericardial
effusion.

Mediastinum/Nodes: No thoracic inlet, axillary, mediastinal or hilar
adenopathy. Esophagus grossly normal.

Lungs/Pleura: Basilar atelectasis. No effusion or consolidation. No
pneumothorax. Airways are patent.

Musculoskeletal: No chest wall mass. Spinal degenerative changes
without acute or destructive bone process

Review of the MIP images confirms the above findings.

CT ABDOMEN and PELVIS FINDINGS

Hepatobiliary: No focal, suspicious hepatic lesion. No
pericholecystic stranding. No biliary duct dilation. Portal vein is
patent.

Pancreas: Normal, without mass, inflammation or ductal dilatation.

Spleen: Spleen normal in size and contour without focal lesion.

Adrenals/Urinary Tract:

Adrenal glands are unremarkable. Symmetric renal enhancement. No
sign of hydronephrosis. No suspicious renal lesion or perinephric
stranding.

Urinary bladder is grossly unremarkable.

Duplicated collecting systems on the LEFT at least to the level of
the mid LEFT ureter

Stomach/Bowel: No acute gastrointestinal process. Signs of
appendectomy.

Vascular/Lymphatic:

Aorta with smooth contours. IVC with smooth contours. No aneurysmal
dilation of the abdominal aorta. There is no gastrohepatic or
hepatoduodenal ligament lymphadenopathy. No retroperitoneal or
mesenteric lymphadenopathy.

No pelvic sidewall lymphadenopathy.

Reproductive: Globular appearance of the uterus which is
retroflexed. No stranding about the uterus or adnexa. No free pelvic
fluid.

Other: No pneumoperitoneum, no ascites.

Musculoskeletal: No acute bone finding. No destructive bone process.
Spinal degenerative changes.

Review of the MIP images confirms the above findings.
IMPRESSION: 1. No central or lobar embolus with artifact, quality mildly
compromised by body habitus and further compromised by bolus timing.
2. No acute cardiopulmonary process.
3. No acute findings in the abdomen or pelvis.
4. Signs of appendectomy.
5. Retroverted uterus.

## 2024-04-24 ENCOUNTER — Encounter: Payer: Self-pay | Admitting: Cardiology

## 2024-04-24 DIAGNOSIS — E876 Hypokalemia: Secondary | ICD-10-CM

## 2024-04-29 ENCOUNTER — Other Ambulatory Visit: Payer: Self-pay | Admitting: Internal Medicine

## 2024-04-29 DIAGNOSIS — E063 Autoimmune thyroiditis: Secondary | ICD-10-CM

## 2024-04-29 DIAGNOSIS — R5383 Other fatigue: Secondary | ICD-10-CM

## 2024-05-12 ENCOUNTER — Emergency Department (HOSPITAL_BASED_OUTPATIENT_CLINIC_OR_DEPARTMENT_OTHER)
Admission: EM | Admit: 2024-05-12 | Discharge: 2024-05-12 | Disposition: A | Discharge status: Home / Self Care | Attending: Emergency Medicine | Admitting: Emergency Medicine

## 2024-05-12 ENCOUNTER — Emergency Department (HOSPITAL_BASED_OUTPATIENT_CLINIC_OR_DEPARTMENT_OTHER)

## 2024-05-12 ENCOUNTER — Other Ambulatory Visit: Payer: Self-pay

## 2024-05-12 ENCOUNTER — Encounter (HOSPITAL_BASED_OUTPATIENT_CLINIC_OR_DEPARTMENT_OTHER): Payer: Self-pay | Admitting: Emergency Medicine

## 2024-05-12 DIAGNOSIS — R1032 Left lower quadrant pain: Secondary | ICD-10-CM | POA: Diagnosis present

## 2024-05-12 DIAGNOSIS — D72829 Elevated white blood cell count, unspecified: Secondary | ICD-10-CM | POA: Insufficient documentation

## 2024-05-12 LAB — URINALYSIS, ROUTINE W REFLEX MICROSCOPIC
Bilirubin Urine: NEGATIVE
Glucose, UA: NEGATIVE mg/dL
Ketones, ur: NEGATIVE mg/dL
Leukocytes,Ua: NEGATIVE
Nitrite: NEGATIVE
Protein, ur: NEGATIVE mg/dL
Specific Gravity, Urine: 1.02 (ref 1.005–1.030)
pH: 7 (ref 5.0–8.0)

## 2024-05-12 LAB — COMPREHENSIVE METABOLIC PANEL WITH GFR
ALT: 23 U/L (ref 0–44)
AST: 21 U/L (ref 15–41)
Albumin: 4.5 g/dL (ref 3.5–5.0)
Alkaline Phosphatase: 49 U/L (ref 38–126)
Anion gap: 12 (ref 5–15)
BUN: 14 mg/dL (ref 6–20)
CO2: 24 mmol/L (ref 22–32)
Calcium: 9.3 mg/dL (ref 8.9–10.3)
Chloride: 104 mmol/L (ref 98–111)
Creatinine, Ser: 0.85 mg/dL (ref 0.44–1.00)
GFR, Estimated: >60 mL/min (ref >60)
Glucose, Bld: 114 mg/dL — ABNORMAL HIGH (ref 70–99)
Potassium: 3.8 mmol/L (ref 3.5–5.1)
Sodium: 139 mmol/L (ref 135–145)
Total Bilirubin: 0.2 mg/dL (ref 0.0–1.2)
Total Protein: 7.3 g/dL (ref 6.5–8.1)

## 2024-05-12 LAB — URINALYSIS, MICROSCOPIC (REFLEX)

## 2024-05-12 LAB — CBC
HCT: 41.9 % (ref 36.0–46.0)
Hemoglobin: 14.7 g/dL (ref 12.0–15.0)
MCH: 29.2 pg (ref 26.0–34.0)
MCHC: 35.1 g/dL (ref 30.0–36.0)
MCV: 83.3 fL (ref 80.0–100.0)
Platelets: 261 K/uL (ref 150–400)
RBC: 5.03 MIL/uL (ref 3.87–5.11)
RDW: 12.9 % (ref 11.5–15.5)
WBC: 12.5 K/uL — ABNORMAL HIGH (ref 4.0–10.5)
nRBC: 0 % (ref 0.0–0.2)

## 2024-05-12 LAB — LIPASE, BLOOD: Lipase: 108 U/L — ABNORMAL HIGH (ref 11–51)

## 2024-05-12 LAB — PREGNANCY, URINE: Preg Test, Ur: NEGATIVE

## 2024-05-12 MED ORDER — ONDANSETRON HCL 4 MG/2ML IJ SOLN
4.0000 mg | Freq: Once | INTRAMUSCULAR | Status: AC
Start: 2024-05-12 — End: 2024-05-12
  Administered 2024-05-12: 4 mg via INTRAVENOUS
  Filled 2024-05-12: qty 2

## 2024-05-12 MED ORDER — LACTATED RINGERS IV BOLUS
1000.0000 mL | Freq: Once | INTRAVENOUS | Status: AC
Start: 2024-05-12 — End: 2024-05-12
  Administered 2024-05-12: 1000 mL via INTRAVENOUS

## 2024-05-12 MED ORDER — PROMETHAZINE HCL 25 MG PO TABS: 25.0000 mg | ORAL_TABLET | Freq: Four times a day (QID) | ORAL | 0 refills | Status: DC | PRN

## 2024-05-12 MED ORDER — IOHEXOL 300 MG/ML  SOLN
100.0000 mL | Freq: Once | INTRAMUSCULAR | Status: AC | PRN
  Administered 2024-05-12: 100 mL via INTRAVENOUS

## 2024-05-12 MED ORDER — ACETAMINOPHEN 500 MG PO TABS
1000.0000 mg | ORAL_TABLET | Freq: Once | ORAL | Status: AC
  Administered 2024-05-12: 1000 mg via ORAL
  Filled 2024-05-12: qty 2

## 2024-05-12 MED ORDER — PROMETHAZINE HCL 25 MG RE SUPP: 25.0000 mg | Freq: Four times a day (QID) | RECTAL | 0 refills | Status: DC | PRN

## 2024-05-12 MED ORDER — DROPERIDOL 2.5 MG/ML IJ SOLN
1.2500 mg | Freq: Once | INTRAMUSCULAR | Status: AC
  Administered 2024-05-12: 1.25 mg via INTRAVENOUS
  Filled 2024-05-12: qty 2

## 2024-05-12 NOTE — ED Notes (Signed)
 ED Provider at bedside.

## 2024-05-12 NOTE — ED Provider Notes (Signed)
 [place] [facility] Provider Note   CSN: [id number] Arrival date & time: 05/12/24  0040     Patient presents with: Abdominal Pain   Sheri Johnson is a 38 y.o. female.   The patient presents with severe abdominal pain that began abruptly, waking them from sleep. The pain is described as the worst they have ever experienced, accompanied by diaphoresis and flushing. The pain is primarily located across the upper abdomen and extends down the left side, wrapping around to the left side of the back. The patient reports that the pain is exacerbated by movement and is relieved only by lying in a fetal position. They have a history of diverticulitis and diverticulosis but state that the current pain does not resemble previous episodes. The patient denies vomiting but reports significant nausea and some chest pain, which they attribute to the abdominal discomfort. They have not consumed alcohol or tobacco and have a history of two cesarean sections. The patient is on low-dose naltrexone for anti-inflammatory purposes and avoids opioids. They were seen in the ER last week for similar but less severe symptoms and have a history of elevated white blood cell count and low potassium, which is monitored due to cardiac arrhythmias. History was obtained from the patient.   Abdominal Pain      Prior to Admission medications   Medication Sig Start Date End Date Taking? Authorizing Provider  promethazine  (PHENERGAN ) 25 MG suppository Place 1 suppository (25 mg total) rectally every 6 (six) hours as needed for nausea or vomiting. 05/12/24  Yes Thuan Tippett, Selinda, MD  promethazine  (PHENERGAN ) 25 MG tablet Take 1 tablet (25 mg total) by mouth every 6 (six) hours as needed for nausea or vomiting. 05/12/24  Yes Kelise Kuch, Selinda, MD  acetaminophen  (TYLENOL ) 325 MG tablet Take 2 tablets (650 mg total) by mouth every 6 (six) hours as needed. 06/30/23   Elnor Jayson LABOR, DO  albuterol   (VENTOLIN  HFA) 108 (90 Base) MCG/ACT inhaler Inhale 1-2 puffs into the lungs every 6 (six) hours as needed for wheezing or shortness of breath. 06/05/23   Mayer, Jodi R, NP  ALPRAZolam  (XANAX  XR) 0.5 MG 24 hr tablet Take by mouth. 07/25/22   [provider]  ALPRAZolam  (XANAX  XR) 1 MG 24 hr tablet Take 1 mg by mouth as needed.    [provider]  Ascorbic Acid (VITAMIN C) 1000 MG tablet Take 1,000 mg by mouth daily.    [provider]  atenolol  (TENORMIN ) 25 MG tablet Take 50 mg (2 tablets) in the morning and 25 mg (1 tablet) in the evening 08/15/23   West, Katlyn D, NP  b complex vitamins capsule Take 1 capsule by mouth daily.    [provider]  baclofen (LIORESAL) 10 MG tablet Take 1 tablet by mouth 3 (three) times daily as needed. 06/29/23   [provider]  cetirizine (ZYRTEC) 10 MG tablet Take 10 mg by mouth 2 (two) times daily.    [provider]  clobetasol (TEMOVATE) 0.05 % external solution Apply 1 Application topically at bedtime. Apply to scalp 02/27/22   [provider]  Digestive Enzymes CAPS Take 1 capsule by mouth 2 (two) times daily before a meal.    [provider]  ferrous sulfate  325 (65 FE) MG EC tablet Take 1 tablet (325 mg total) by mouth daily with breakfast. 01/09/24   Onita Duos, MD  fluticasone (FLONASE) 50 MCG/ACT nasal spray Place 1 spray into both nostrils daily as  needed for allergies or rhinitis.    [provider]  gabapentin (NEURONTIN) 300 MG capsule Take 300 mg by mouth as needed. 01/11/23   [provider]  ibuprofen  (ADVIL ) 600 MG tablet Take 1 tablet (600 mg total) by mouth every 6 (six) hours as needed. 06/30/23   Elnor Jayson LABOR, DO  levothyroxine  (SYNTHROID ) 100 MCG tablet Take 1 tablet (100 mcg total) by mouth daily. 04/08/24   Trixie File, MD  liothyronine  (CYTOMEL ) 5 MCG tablet Take 1 tablet (5 mcg total) by mouth daily. 04/08/24   Trixie File, MD  Magnesium  400 MG  CAPS Take 400 mg by mouth daily as needed (Low Magnesium ).    [provider]  OVER THE COUNTER MEDICATION Take 2 tablets by mouth daily. Medication: Beet Root Supplement    [provider]  potassium chloride  SA (KLOR-CON  M) 20 MEQ tablet Take 1 tablet (20 mEq total) by mouth daily. 12/14/21   Lelon Hamilton T, PA-C  Probiotic Product (PROBIOTIC DAILY PO) Take 2 each by mouth at bedtime.    [provider]  QVAR REDIHALER 40 MCG/ACT inhaler Inhale 2 puffs into the lungs at bedtime. 01/15/23   [provider]  TURMERIC CURCUMIN PO Take 2 capsules by mouth daily.    [provider]    Allergies: Metoprolol , Opium , Cephalexin, Cipro [ciprofloxacin hcl], Prednisone, and Sulfamethoxazole -trimethoprim     Review of Systems  Gastrointestinal:  Positive for abdominal pain.    Updated Vital Signs BP (!) 97/53   Pulse 64   Temp 98.2 F (36.8 C) (Oral)   Resp 20   Ht 5' 9 (1.753 m)   Wt 108.9 kg   LMP 04/21/2024 (Exact Date)   SpO2 96%   BMI 35.44 kg/m   Physical Exam Vitals and nursing note reviewed.  Constitutional:      Appearance: She is well-developed.  HENT:     Head: Normocephalic and atraumatic.  Cardiovascular:     Rate and Rhythm: Normal rate and regular rhythm.  Pulmonary:     Effort: No respiratory distress.     Breath sounds: No stridor.  Abdominal:     General: There is no distension.     Tenderness: There is abdominal tenderness in the left lower quadrant. There is no guarding or rebound.  Musculoskeletal:     Cervical back: Normal range of motion.  Neurological:     Mental Status: She is alert.     (all labs ordered are listed, but only abnormal results are displayed) Labs Reviewed  LIPASE, BLOOD - Abnormal; Notable for the following components:      Result Value   Lipase 108 (*)    All other components within normal limits  COMPREHENSIVE METABOLIC PANEL WITH GFR - Abnormal; Notable for the following components:    Glucose, Bld 114 (*)    All other components within normal limits  CBC - Abnormal; Notable for the following components:   WBC 12.5 (*)    All other components within normal limits  URINALYSIS, ROUTINE W REFLEX MICROSCOPIC - Abnormal; Notable for the following components:   Hgb urine dipstick TRACE (*)    All other components within normal limits  URINALYSIS, MICROSCOPIC (REFLEX) - Abnormal; Notable for the following components:   Bacteria, UA RARE (*)    All other components within normal limits  PREGNANCY, URINE    EKG: None  Radiology: CT ABDOMEN PELVIS W CONTRAST Result Date: 05/12/2024 EXAM: CT ABDOMEN AND PELVIS WITH CONTRAST 05/12/2024 02:06:00 AM TECHNIQUE:  CT of the abdomen and pelvis was performed with the administration of intravenous contrast, 100mL of iohexol  (OMNIPAQUE ) 300 MG/ML solution. Multiplanar reformatted images are provided for review. Automated exposure control, iterative reconstruction, and/or weight-based adjustment of the mA/kV was utilized to reduce the radiation dose to as low as reasonably achievable. COMPARISON: Comparison is made to July 21, 2023. CLINICAL HISTORY: Abdominal pain, acute, nonlocalized. Patient via POV c/o severe left upper quadrant abdominal pain radiating around to left flank x 15 minutes PTA. Patient is sweating and nauseated and reports dark-colored urine. No prior history of kidney stones and history of appendicitis and c-section. Pain rated 10/10. FINDINGS: LOWER CHEST: No acute abnormality. LIVER: The liver is unremarkable. GALLBLADDER AND BILE DUCTS: Gallbladder is unremarkable. No biliary ductal dilatation. SPLEEN: No acute abnormality. PANCREAS: No acute abnormality. ADRENAL GLANDS: No acute abnormality. KIDNEYS, URETERS AND BLADDER: No stones in the kidneys or ureters. No hydronephrosis. No perinephric or periureteral stranding. Urinary bladder is unremarkable. GI AND BOWEL: Stomach demonstrates no acute abnormality. PERITONEUM AND  RETROPERITONEUM: Trace free fluid within the pelvis, possibly physiologic. VASCULATURE: Aorta is normal in caliber. LYMPH NODES: No lymphadenopathy. REPRODUCTIVE ORGANS: Status post appendectomy. Corpus luteum noted within the right ovary. Pelvic organs are otherwise unremarkable. BONES AND SOFT TISSUES: Osseous structures are age appropriate. No acute bone abnormality. No lytic or blastic bone lesion. IMPRESSION: 1. No acute findings in the abdomen or pelvis. Electronically signed by: Dorethia Molt MD 05/12/2024 02:25 AM EDT RP Workstation: [id number]     Procedures   Medications Ordered in the ED  acetaminophen  (TYLENOL ) tablet 1,000 mg (1,000 mg Oral Given 05/12/24 0151)  lactated ringers  bolus 1,000 mL (0 mLs Intravenous Stopped 05/12/24 0304)  ondansetron  (ZOFRAN ) injection 4 mg (4 mg Intravenous Given 05/12/24 0151)  iohexol  (OMNIPAQUE ) 300 MG/ML solution 100 mL (100 mLs Intravenous Contrast Given 05/12/24 0205)  droperidol (INAPSINE) 2.5 MG/ML injection 1.25 mg (1.25 mg Intravenous Given 05/12/24 0300)                                    Medical Decision Making Amount and/or Complexity of Data Reviewed Labs: ordered. Radiology: ordered.  Risk OTC drugs. Prescription drug management.   Patient slightly elevated lipase and white blood cell count along with focal left lower quadrant tenderness concerning for possible diverticulitis versus kidney stone versus pancreatitis.  CT scan done and negative for these etiologies also no evidence of bowel obstruction.  Patient did not want opiates secondary to her medications and other medical problems so am trying to manage her symptoms with nonopiate medications.  Patients improved significantly with droperidol. Will follow up with GI. Phenergan  sent in. Patient asked about her eosinophils beign high on previous labs. These werent checked today, I suggested pcp follow up for the same.   Final diagnoses:  Left lower quadrant abdominal pain     ED Discharge Orders          Ordered    promethazine  (PHENERGAN ) 25 MG tablet  Every 6 hours PRN        05/12/24 0513    promethazine  (PHENERGAN ) 25 MG suppository  Every 6 hours PRN        05/12/24 0513               Lynnann Knudsen, Selinda, MD 05/12/24 [phone]

## 2024-05-12 NOTE — ED Notes (Signed)
 Pt notes that after last med administration she is feeling better.  Denies any further nausea at this time.  She is resting comfortably supine in bed.

## 2024-05-12 NOTE — ED Triage Notes (Signed)
 Pt via POV c/o severe LUQ abdominal pain radiating around to left flank x 15 mins PTA. Pt is sweating and nauseated and reports dark-colored urine. No prior hx kidney stones and hx appendicitis and c-section. Pain rated 10/10

## 2024-05-12 NOTE — ED Notes (Signed)
 Patient transported to CT

## 2024-05-13 ENCOUNTER — Other Ambulatory Visit

## 2024-05-16 ENCOUNTER — Other Ambulatory Visit

## 2024-05-17 ENCOUNTER — Ambulatory Visit: Payer: Self-pay | Admitting: Internal Medicine

## 2024-05-19 ENCOUNTER — Other Ambulatory Visit: Payer: Self-pay | Admitting: Internal Medicine

## 2024-05-19 MED ORDER — LEVOTHYROXINE SODIUM 88 MCG PO TABS: 88.0000 ug | ORAL_TABLET | Freq: Every day | ORAL | 3 refills | Status: DC

## 2024-05-21 LAB — ACTH: C206 ACTH: 16 pg/mL (ref 6–50)

## 2024-05-21 LAB — T3, FREE: T3, Free: 4 pg/mL (ref 2.3–4.2)

## 2024-05-21 LAB — CORTISOL: Cortisol, Plasma: 6.2 ug/dL

## 2024-05-21 LAB — TSH: TSH: 1.76 m[IU]/L

## 2024-05-21 LAB — T4, FREE: Free T4: 1.2 ng/dL (ref 0.8–1.8)

## 2024-05-21 MED ORDER — POTASSIUM CHLORIDE CRYS ER 20 MEQ PO TBCR: 20.0000 meq | EXTENDED_RELEASE_TABLET | Freq: Three times a day (TID) | ORAL | 3 refills | Status: AC

## 2024-05-23 NOTE — Telephone Encounter (Signed)
A/P: see below

## 2024-05-24 ENCOUNTER — Emergency Department (HOSPITAL_BASED_OUTPATIENT_CLINIC_OR_DEPARTMENT_OTHER)

## 2024-05-24 ENCOUNTER — Other Ambulatory Visit: Payer: Self-pay

## 2024-05-24 ENCOUNTER — Emergency Department (HOSPITAL_BASED_OUTPATIENT_CLINIC_OR_DEPARTMENT_OTHER)
Admission: EM | Admit: 2024-05-24 | Discharge: 2024-05-24 | Disposition: A | Discharge status: Home / Self Care | Attending: Emergency Medicine | Admitting: Emergency Medicine

## 2024-05-24 ENCOUNTER — Encounter (HOSPITAL_BASED_OUTPATIENT_CLINIC_OR_DEPARTMENT_OTHER): Payer: Self-pay | Admitting: Emergency Medicine

## 2024-05-24 DIAGNOSIS — I10 Essential (primary) hypertension: Secondary | ICD-10-CM | POA: Insufficient documentation

## 2024-05-24 DIAGNOSIS — Z79899 Other long term (current) drug therapy: Secondary | ICD-10-CM | POA: Insufficient documentation

## 2024-05-24 DIAGNOSIS — R1084 Generalized abdominal pain: Secondary | ICD-10-CM | POA: Insufficient documentation

## 2024-05-24 LAB — CBC WITH DIFFERENTIAL/PLATELET
Abs Immature Granulocytes: 0.02 K/uL (ref 0.00–0.07)
Basophils Absolute: 0 K/uL (ref 0.0–0.1)
Basophils Relative: 0 %
Eosinophils Absolute: 1.8 K/uL — ABNORMAL HIGH (ref 0.0–0.5)
Eosinophils Relative: 22 %
HCT: 43 % (ref 36.0–46.0)
Hemoglobin: 14.7 g/dL (ref 12.0–15.0)
Immature Granulocytes: 0 %
Lymphocytes Relative: 23 %
Lymphs Abs: 1.9 K/uL (ref 0.7–4.0)
MCH: 29 pg (ref 26.0–34.0)
MCHC: 34.2 g/dL (ref 30.0–36.0)
MCV: 84.8 fL (ref 80.0–100.0)
Monocytes Absolute: 0.6 K/uL (ref 0.1–1.0)
Monocytes Relative: 8 %
Neutro Abs: 4 K/uL (ref 1.7–7.7)
Neutrophils Relative %: 47 %
Platelets: 254 K/uL (ref 150–400)
RBC: 5.07 MIL/uL (ref 3.87–5.11)
RDW: 12.8 % (ref 11.5–15.5)
WBC: 8.4 K/uL (ref 4.0–10.5)
nRBC: 0 % (ref 0.0–0.2)

## 2024-05-24 LAB — URINALYSIS, W/ REFLEX TO CULTURE (INFECTION SUSPECTED)
Bilirubin Urine: NEGATIVE
Glucose, UA: NEGATIVE mg/dL
Ketones, ur: NEGATIVE mg/dL
Leukocytes,Ua: NEGATIVE
Nitrite: NEGATIVE
Protein, ur: NEGATIVE mg/dL
Specific Gravity, Urine: <=1.005 (ref 1.005–1.030)
pH: 5.5 (ref 5.0–8.0)

## 2024-05-24 LAB — COMPREHENSIVE METABOLIC PANEL WITH GFR
ALT: 15 U/L (ref 0–44)
AST: 16 U/L (ref 15–41)
Albumin: 4.4 g/dL (ref 3.5–5.0)
Alkaline Phosphatase: 45 U/L (ref 38–126)
Anion gap: 11 (ref 5–15)
BUN: 12 mg/dL (ref 6–20)
CO2: 24 mmol/L (ref 22–32)
Calcium: 9.2 mg/dL (ref 8.9–10.3)
Chloride: 104 mmol/L (ref 98–111)
Creatinine, Ser: 0.88 mg/dL (ref 0.44–1.00)
GFR, Estimated: >60 mL/min (ref >60)
Glucose, Bld: 101 mg/dL — ABNORMAL HIGH (ref 70–99)
Potassium: 4.1 mmol/L (ref 3.5–5.1)
Sodium: 139 mmol/L (ref 135–145)
Total Bilirubin: 0.3 mg/dL (ref 0.0–1.2)
Total Protein: 6.7 g/dL (ref 6.5–8.1)

## 2024-05-24 LAB — HCG, SERUM, QUALITATIVE: Preg, Serum: NEGATIVE

## 2024-05-24 LAB — TROPONIN T, HIGH SENSITIVITY
Troponin T High Sensitivity: <15 ng/L (ref 0–19)
Troponin T High Sensitivity: <15 ng/L (ref 0–19)

## 2024-05-24 LAB — LIPASE, BLOOD: Lipase: 262 U/L — ABNORMAL HIGH (ref 11–51)

## 2024-05-24 MED ORDER — IOHEXOL 300 MG/ML  SOLN
100.0000 mL | Freq: Once | INTRAMUSCULAR | Status: AC | PRN
  Administered 2024-05-24: 100 mL via INTRAVENOUS

## 2024-05-24 MED ORDER — KETOROLAC TROMETHAMINE 30 MG/ML IJ SOLN
30.0000 mg | Freq: Once | INTRAMUSCULAR | Status: AC
  Administered 2024-05-24: 30 mg via INTRAVENOUS
  Filled 2024-05-24: qty 1

## 2024-05-24 MED ORDER — DROPERIDOL 2.5 MG/ML IJ SOLN
2.5000 mg | Freq: Once | INTRAMUSCULAR | Status: AC
  Administered 2024-05-24: 2.5 mg via INTRAVENOUS
  Filled 2024-05-24: qty 2

## 2024-05-24 MED ORDER — ONDANSETRON HCL 4 MG/2ML IJ SOLN
4.0000 mg | Freq: Once | INTRAMUSCULAR | Status: AC
  Administered 2024-05-24: 4 mg via INTRAVENOUS
  Filled 2024-05-24: qty 2

## 2024-05-24 MED ORDER — SODIUM CHLORIDE 0.9 % IV BOLUS
1000.0000 mL | Freq: Once | INTRAVENOUS | Status: AC
  Administered 2024-05-24: 1000 mL via INTRAVENOUS

## 2024-05-24 NOTE — Discharge Instructions (Signed)
 He was seen in the emergency room today with abdominal pain.  Your pancreas labs are mildly elevated.  Otherwise, your labs are reassuring.  You may have some mild or early pancreatitis but your CT scan did not show any evidence of severe pancreatitis.  Please stick to mainly liquids today and advance your diet slowly as tolerated with bland foods such as crackers or toast.  Please keep your follow-up appointment with the gastroenterology doctors on Tuesday.

## 2024-05-24 NOTE — ED Provider Notes (Signed)
 Blood pressure (!) 160/84, pulse 78, temperature 98.3 F (36.8 C), resp. rate 18, height 5' 9 (1.753 m), weight 105.2 kg, last menstrual period 05/21/2024, SpO2 100%.  Assuming care from Dr. Geroldine.  In short, Sheri Johnson is a 38 y.o. female with a chief complaint of Abdominal Pain .  Refer to the original H&P for additional details.  The current plan of care is to follow up CT imaging.  08:36 AM  CT chest, abdomen, pelvis without any acute findings.  Patient's lipase is mildly elevated to 260 but she is tolerating PO here and feeling better.  Biliary labs are normal.  Troponin negative x 2.  Plan for discharge home with mainly liquids today and transition her diet as tolerated.  She has a follow-up appointment with GI on Tuesday and I encouraged her to keep this appointment.  Discussed ED return precautions.  Patient is comfortable with the plan at discharge.   Darra Fonda MATSU, MD 05/24/24 [phone]

## 2024-05-24 NOTE — ED Provider Notes (Signed)
 [place] [facility] Provider Note   CSN: [id number] Arrival date & time: 05/24/24  9546     Patient presents with: Abdominal Pain   Sheri Johnson is a 38 y.o. female.   Patient is a 38 year old female with past medical history of hypertension, SVT, migraines, and recent episodes of abdominal pain that have been thus far unexplained.  Patient presenting today with complaints of abdominal pain.  She describes severe pain to the left side of her abdomen that is preventing her from sleeping this evening.  She denies any fevers or chills.  She does report nausea, vomiting, and diarrhea and reports having noticed some blood in her vomit this evening.  She has been seen in the ER, by gastroenterology, by GYN, and by her allergist, however no explanation for the symptoms has been given.       Prior to Admission medications   Medication Sig Start Date End Date Taking? Authorizing Provider  acetaminophen  (TYLENOL ) 325 MG tablet Take 2 tablets (650 mg total) by mouth every 6 (six) hours as needed. 06/30/23   Elnor Jayson LABOR, DO  albuterol  (VENTOLIN  HFA) 108 (90 Base) MCG/ACT inhaler Inhale 1-2 puffs into the lungs every 6 (six) hours as needed for wheezing or shortness of breath. 06/05/23   Mayer, Jodi R, NP  ALPRAZolam  (XANAX  XR) 0.5 MG 24 hr tablet Take by mouth. 07/25/22   [provider]  ALPRAZolam  (XANAX  XR) 1 MG 24 hr tablet Take 1 mg by mouth as needed.    [provider]  Ascorbic Acid (VITAMIN C) 1000 MG tablet Take 1,000 mg by mouth daily.    [provider]  atenolol  (TENORMIN ) 25 MG tablet Take 50 mg (2 tablets) in the morning and 25 mg (1 tablet) in the evening 08/15/23   West, Katlyn D, NP  b complex vitamins capsule Take 1 capsule by mouth daily.    [provider]  baclofen (LIORESAL) 10 MG tablet Take 1 tablet by mouth 3 (three) times daily as needed. 06/29/23   [provider]  cetirizine (ZYRTEC)  10 MG tablet Take 10 mg by mouth 2 (two) times daily.    [provider]  clobetasol (TEMOVATE) 0.05 % external solution Apply 1 Application topically at bedtime. Apply to scalp 02/27/22   [provider]  Digestive Enzymes CAPS Take 1 capsule by mouth 2 (two) times daily before a meal.    [provider]  ferrous sulfate  325 (65 FE) MG EC tablet Take 1 tablet (325 mg total) by mouth daily with breakfast. 01/09/24   Onita Duos, MD  fluticasone (FLONASE) 50 MCG/ACT nasal spray Place 1 spray into both nostrils daily as needed for allergies or rhinitis.    [provider]  gabapentin (NEURONTIN) 300 MG capsule Take 300 mg by mouth as needed. 01/11/23   [provider]  ibuprofen  (ADVIL ) 600 MG tablet Take 1 tablet (600 mg total) by mouth every 6 (six) hours as needed. 06/30/23   Elnor Jayson LABOR, DO  levothyroxine  (SYNTHROID ) 88 MCG tablet Take 1 tablet (88 mcg total) by mouth daily. 05/19/24   Trixie File, MD  liothyronine  (CYTOMEL ) 5 MCG tablet Take 1 tablet (5 mcg total) by mouth daily. 04/08/24   Trixie File, MD  Magnesium  400 MG CAPS Take 400 mg by mouth daily as needed (Low Magnesium ).    [provider]  OVER THE COUNTER MEDICATION Take 2 tablets by mouth daily. Medication: Beet Root Supplement  [provider]  potassium chloride  SA (KLOR-CON  M) 20 MEQ tablet Take 1 tablet (20 mEq total) by mouth 3 (three) times daily. 05/21/24   Pietro Redell RAMAN, MD  Probiotic Product (PROBIOTIC DAILY PO) Take 2 each by mouth at bedtime.    [provider]  promethazine  (PHENERGAN ) 25 MG suppository Place 1 suppository (25 mg total) rectally every 6 (six) hours as needed for nausea or vomiting. 05/12/24   Mesner, Selinda, MD  promethazine  (PHENERGAN ) 25 MG tablet Take 1 tablet (25 mg total) by mouth every 6 (six) hours as needed for nausea or vomiting. 05/12/24   Mesner, Selinda, MD  QVAR REDIHALER 40 MCG/ACT inhaler Inhale 2 puffs into the  lungs at bedtime. 01/15/23   [provider]  TURMERIC CURCUMIN PO Take 2 capsules by mouth daily.    [provider]    Allergies: Metoprolol , Opium , Cephalexin, Cipro [ciprofloxacin hcl], Prednisone, and Sulfamethoxazole -trimethoprim     Review of Systems  All other systems reviewed and are negative.   Updated Vital Signs BP (!) 160/84   Pulse 78   Temp 98.3 F (36.8 C)   Resp 18   Ht 5' 9 (1.753 m)   Wt 105.2 kg   LMP 05/21/2024 (Exact Date)   SpO2 100%   BMI 34.26 kg/m   Physical Exam Vitals and nursing note reviewed.  Constitutional:      General: She is not in acute distress.    Appearance: She is well-developed. She is not diaphoretic.  HENT:     Head: Normocephalic and atraumatic.  Cardiovascular:     Rate and Rhythm: Normal rate and regular rhythm.     Heart sounds: No murmur heard.    No friction rub. No gallop.  Pulmonary:     Effort: Pulmonary effort is normal. No respiratory distress.     Breath sounds: Normal breath sounds. No wheezing.  Abdominal:     General: Bowel sounds are normal. There is no distension.     Palpations: Abdomen is soft.     Tenderness: There is abdominal tenderness in the left upper quadrant and left lower quadrant. There is left CVA tenderness. There is no right CVA tenderness, guarding or rebound.  Musculoskeletal:        General: Normal range of motion.     Cervical back: Normal range of motion and neck supple.  Skin:    General: Skin is warm and dry.  Neurological:     General: No focal deficit present.     Mental Status: She is alert and oriented to person, place, and time.     (all labs ordered are listed, but only abnormal results are displayed) Labs Reviewed  COMPREHENSIVE METABOLIC PANEL WITH GFR  LIPASE, BLOOD  CBC WITH DIFFERENTIAL/PLATELET  URINALYSIS, W/ REFLEX TO CULTURE (INFECTION SUSPECTED)  HCG, SERUM, QUALITATIVE  TROPONIN T, HIGH SENSITIVITY    EKG: EKG  Interpretation Date/Time:  Saturday May 24 2024 06:14:32 EDT Ventricular Rate:  69 PR Interval:  160 QRS Duration:  100 QT Interval:  419 QTC Calculation: 449 R Axis:   69  Text Interpretation: Sinus rhythm Low voltage, precordial leads Confirmed by Geroldine Berg ([id number]) on 05/24/2024 6:17:19 AM  Radiology: No results found.   Procedures   Medications Ordered in the ED  ketorolac  (TORADOL ) 30 MG/ML injection 30 mg (has no administration in time range)  ondansetron  (ZOFRAN ) injection 4 mg (has no administration in time range)  sodium chloride  0.9 % bolus 1,000 mL (has no administration in  time range)                                    Medical Decision Making Amount and/or Complexity of Data Reviewed Labs: ordered. Radiology: ordered.  Risk Prescription drug management.   Patient presenting here with complaints of left upper quadrant pain as described in the HPI.  She has been evaluated on previous occasions, but no definitive cause has been found.  She returns with ongoing discomfort, vomiting, and loose stools.  Laboratory studies obtained including CBC, CMP, and lipase.  She has no leukocytosis, no elevation of liver enzymes, and no significant electrolyte derangement.  She does however have an elevation of her lipase at 262 which is new.  CT scan of the abdomen and pelvis has been ordered and is currently pending.  Care will be signed out to oncoming provider at shift change.  Patient has been given droperidol and Toradol  for pain as she has requested no opioids.     Final diagnoses:  None    ED Discharge Orders     None          Geroldine Berg, MD 05/24/24 [phone]

## 2024-05-24 NOTE — ED Triage Notes (Signed)
 Pt c/o left sided abdominal pain with N/V/D x 2 weeks. Seen here and by GI for the same. Endorses blood in her vomit.

## 2024-05-24 NOTE — ED Notes (Signed)
 Patient transported to CT

## 2024-05-30 ENCOUNTER — Telehealth: Payer: Self-pay | Admitting: Cardiology

## 2024-05-30 LAB — BASIC METABOLIC PANEL WITH GFR
BUN/Creatinine Ratio: 7 — ABNORMAL LOW (ref 9–23)
BUN: 7 mg/dL (ref 6–24)
CO2: 23 mmol/L (ref 20–29)
Calcium: 9.8 mg/dL (ref 8.7–10.2)
Chloride: 102 mmol/L (ref 96–106)
Creatinine, Ser: 0.96 mg/dL (ref 0.57–1.00)
Glucose: 88 mg/dL (ref 70–99)
Potassium: 4.9 mmol/L (ref 3.5–5.2)
Sodium: 139 mmol/L (ref 134–144)
eGFR: 77 mL/min/1.73 (ref >59)

## 2024-05-30 NOTE — Telephone Encounter (Signed)
 Left voice message to call back 10/24

## 2024-05-30 NOTE — Telephone Encounter (Signed)
 Patient is calling to see if she can get an EKG done. Please advise

## 2024-06-01 ENCOUNTER — Ambulatory Visit: Payer: Self-pay | Admitting: Cardiology

## 2024-06-03 NOTE — Telephone Encounter (Signed)
 Left detailed message advising pt to call back to the office if she still needed to discuss.  Also explained that I see she has an appt scheduled for next week and to call office back if she still needed to discuss.

## 2024-06-03 NOTE — Progress Notes (Signed)
 HPI: FU SVT. Seen with palpitations 01/08/21; was in SVT (ECG with SVT 231 with diffuse ST depression); EMS terminated arrhythmia with adenosine . 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  Cardiac CTA September 2023 showed calcium  score 0 and no coronary disease; dilated main pulmonary artery at 31 mm suggestive of pulmonary hypertension.  Echocardiogram March 2024 showed normal LV function.  ABIs with Doppler May 2024 normal.  Venous Dopplers May 2024 normal.  CTA May 2024 showed no acute chest findings including no pulmonary embolus. Has been seen with recurrent palpitations without document recurrence of SVT; also has been seen with atypical CP. Since last seen patient complains of dyspnea on exertion, continued difficulties with chest pain, elevated heart rate.  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.  Current Outpatient Medications  Medication Sig Dispense Refill   acetaminophen  (TYLENOL ) 325 MG tablet Take 2 tablets (650 mg total) by mouth every 6 (six) hours as needed. 36 tablet 0   albuterol  (VENTOLIN  HFA) 108 (90 Base) MCG/ACT inhaler Inhale 1-2 puffs into the lungs every 6 (six) hours as needed for wheezing or shortness of breath. 1 each 0   ALPRAZolam  (XANAX  XR) 0.5 MG 24 hr tablet Take by mouth.     ALPRAZolam  (XANAX  XR) 1 MG 24 hr tablet Take 1 mg by mouth as needed.     atenolol  (TENORMIN ) 25 MG tablet Take 50 mg (2 tablets) in the morning and 25 mg (1 tablet) in the evening (Patient taking differently: Take 25 mg by mouth 2 (two) times daily.) 270 tablet 3   azelastine (ASTELIN) 0.1 % nasal spray Place 1 spray into both nostrils 2 (two) times daily.     baclofen (LIORESAL) 10 MG tablet Take 1 tablet by mouth 3 (three) times daily as needed.     cetirizine (ZYRTEC) 10 MG tablet Take 10 mg by  mouth 2 (two) times daily.     clobetasol (TEMOVATE) 0.05 % external solution Apply 1 Application topically at bedtime. Apply to scalp     Digestive Enzymes CAPS Take 1 capsule by mouth 2 (two) times daily before a meal.     ferrous sulfate  325 (65 FE) MG EC tablet Take 1 tablet (325 mg total) by mouth daily with breakfast. 30 tablet 6   fluticasone (FLONASE) 50 MCG/ACT nasal spray Place 1 spray into both nostrils daily as needed for allergies or rhinitis.     fluticasone-salmeterol (ADVAIR HFA) 45-21 MCG/ACT inhaler Inhale 2 puffs into the lungs 2 (two) times daily.     gabapentin (NEURONTIN) 300 MG capsule Take 300 mg by mouth as needed.     ibuprofen  (ADVIL ) 600 MG tablet Take 1 tablet (600 mg total) by mouth every 6 (six) hours as needed. 30 tablet 0   levothyroxine  (SYNTHROID ) 88 MCG tablet Take 1 tablet (88 mcg total) by mouth daily. 45 tablet 3   linaclotide (LINZESS) 72 MCG capsule Take 72 mcg by mouth daily before breakfast.     liothyronine  (CYTOMEL ) 5 MCG tablet Take 0.5 tablets (2.5 mcg total) by mouth daily.     Magnesium  400 MG CAPS Take 400 mg by mouth daily as needed (Low Magnesium ).     Naltrexone HCl, Pain, 1.5 MG CAPS Take 1 capsule by mouth daily.     omeprazole (PRILOSEC) 40 MG capsule Take  20 mg by mouth daily.     potassium chloride  SA (KLOR-CON  M) 20 MEQ tablet Take 1 tablet (20 mEq total) by mouth 3 (three) times daily. 270 tablet 3   Probiotic Product (PROBIOTIC DAILY PO) Take 2 each by mouth at bedtime.     valACYclovir (VALTREX) 500 MG tablet Take 500 mg by mouth 2 (two) times daily.     No current facility-administered medications for this visit.     Past Medical History:  Diagnosis Date   Abortion 11/14/2021   Anxiety    Asthma    Frequent headaches    Hashimoto's disease    Hypertension    Hypothyroid    Lupus (systemic lupus erythematosus) (HCC)    SVT (supraventricular tachycardia)     Past Surgical History:  Procedure Laterality Date   CESAREAN  SECTION     x2   LAPAROSCOPIC APPENDECTOMY N/A 12/14/2016   Procedure: APPENDECTOMY LAPAROSCOPIC;  Surgeon: Curvin Deward MOULD, MD;  Location: [facility] OR;  Service: General;  Laterality: N/A;   SVT ABLATION N/A 03/31/2022   Procedure: SVT ABLATION;  Surgeon: Waddell Danelle ORN, MD;  Location: [facility] INVASIVE CV LAB;  Service: Cardiovascular;  Laterality: N/A;    Social History   Socioeconomic History   Marital status: Single    Spouse name: Not on file   Number of children: 3   Years of education: Not on file   Highest education level: Associate degree: academic program  Occupational History   Not on file  Tobacco Use   Smoking status: Never   Smokeless tobacco: Never  Vaping Use   Vaping status: Never Used  Substance and Sexual Activity   Alcohol use: Not Currently    Comment: Rare   Drug use: Never   Sexual activity: Yes  Other Topics Concern   Not on file  Social History Narrative   Caffeine- none   Social Drivers of Health   Financial Resource Strain: Low Risk  (02/06/2024)   Received from [facility]   Overall Financial Resource Strain (CARDIA)    Difficulty of Paying Living Expenses: Not very hard  Food Insecurity: No Food Insecurity (02/06/2024)   Received from [facility]   Hunger Vital Sign    Within the past 12 months, you worried that your food would run out before you got the money to buy more.: Never true    Within the past 12 months, the food you bought just didn't last and you didn't have money to get more.: Never true  Transportation Needs: No Transportation Needs (02/06/2024)   Received from [facility] - Transportation    Lack of Transportation (Medical): No    Lack of Transportation (Non-Medical): No  Physical Activity: Sufficiently Active (02/06/2024)   Received from [facility]   Exercise Vital Sign    On average, how many days per week do you engage in moderate to strenuous exercise (like a brisk walk)?: 5 days    On average, how many minutes do  you engage in exercise at this level?: 60 min  Stress: Stress Concern Present (02/06/2024)   Received from [facility] - Occupational Stress Questionnaire    Feeling of Stress : To some extent  Social Connections: Moderately Integrated (02/06/2024)   Received from [facility]   Social Network    How would you rate your social network (family, work, friends)?: Adequate participation with social networks  Intimate Partner Violence: Not At Risk (05/08/2024)  Received from [facility]   HITS    Over the last 12 months how often did your partner physically hurt you?: Never    Over the last 12 months how often did your partner insult you or talk down to you?: Never    Over the last 12 months how often did your partner threaten you with physical harm?: Never    Over the last 12 months how often did your partner scream or curse at you?: Never    Family History  Problem Relation Age of Onset   Hypertension Mother    Hyperthyroidism Mother     ROS: no fevers or chills, productive cough, hemoptysis, dysphasia, odynophagia, melena, hematochezia, dysuria, hematuria, rash, seizure activity, orthopnea, PND, pedal edema, claudication. Remaining systems are negative.  Physical Exam: Well-developed well-nourished in no acute distress.  Skin is warm and dry.  HEENT is normal.  Neck is supple.  Chest is clear to auscultation with normal expansion.  Cardiovascular exam is regular rate and rhythm.  Abdominal exam nontender or distended. No masses palpated. Extremities show no edema. neuro grossly intact   May 24, 2024-normal sinus rhythm with no ST changes.   A/P  1 supraventricular tachycardia-history of AV node reentrant tachycardia ablation.  Will continue beta-blocker.  2 hypertension-controlled; continue present meds and follow.   3 history of chest pain-problems with recurrent atypical CP. Previous CTA showed no coronary disease.   Electrocardiogram October 2025 shows no ST changes.  Continue observation.   4 palpitations-patient continues to have episodes of palpitations.  We discussed a smart watch to record her rhythm at time of symptoms.  We will treat accordingly.  Will repeat echocardiogram as she is also complaining of dyspnea on exertion.  5 anxiety-managed by primary care.  Redell Shallow, MD

## 2024-06-07 ENCOUNTER — Encounter: Payer: Self-pay | Admitting: Internal Medicine

## 2024-06-09 ENCOUNTER — Other Ambulatory Visit

## 2024-06-09 ENCOUNTER — Other Ambulatory Visit: Payer: Self-pay | Admitting: Internal Medicine

## 2024-06-09 DIAGNOSIS — E063 Autoimmune thyroiditis: Secondary | ICD-10-CM

## 2024-06-10 ENCOUNTER — Ambulatory Visit: Payer: Self-pay | Admitting: Internal Medicine

## 2024-06-10 LAB — T3, FREE: T3, Free: 3.8 pg/mL (ref 2.3–4.2)

## 2024-06-10 LAB — T4, FREE: Free T4: 1.3 ng/dL (ref 0.8–1.8)

## 2024-06-10 LAB — TSH: TSH: 0.28 m[IU]/L — ABNORMAL LOW

## 2024-06-10 MED ORDER — LIOTHYRONINE SODIUM 5 MCG PO TABS: 2.5000 ug | ORAL_TABLET | Freq: Every day | ORAL | Status: DC

## 2024-06-13 ENCOUNTER — Encounter: Payer: Self-pay | Admitting: Cardiology

## 2024-06-13 ENCOUNTER — Ambulatory Visit: Attending: Cardiology | Admitting: Cardiology

## 2024-06-13 VITALS — BP 106/70 | HR 70 | Ht 69.0 in | Wt 227.0 lb

## 2024-06-13 DIAGNOSIS — R002 Palpitations: Secondary | ICD-10-CM

## 2024-06-13 DIAGNOSIS — I471 Supraventricular tachycardia, unspecified: Secondary | ICD-10-CM

## 2024-06-13 DIAGNOSIS — I10 Essential (primary) hypertension: Secondary | ICD-10-CM

## 2024-06-13 NOTE — Patient Instructions (Signed)
  Testing/Procedures:  Your physician has requested that you have an echocardiogram. Echocardiography is a painless test that uses sound waves to create images of your heart. It provides your doctor with information about the size and shape of your heart and how well your heart's chambers and valves are working. This procedure takes approximately one hour. There are no restrictions for this procedure. Please do NOT wear cologne, perfume, aftershave, or lotions (deodorant is allowed). Please arrive 15 minutes prior to your appointment time.  Please note: We ask at that you not bring children with you during ultrasound (echo/ vascular) testing. Due to room size and safety concerns, children are not allowed in the ultrasound rooms during exams. Our front office staff cannot provide observation of children in our lobby area while testing is being conducted. An adult accompanying a patient to their appointment will only be allowed in the ultrasound room at the discretion of the ultrasound technician under special circumstances. We apologize for any inconvenience. [address]  Follow-Up: At [facility], you and your health needs are our priority.  As part of our continuing mission to provide you with exceptional heart care, our providers are all part of one team.  This team includes your primary Cardiologist (physician) and Advanced Practice Providers or APPs (Physician Assistants and Nurse Practitioners) who all work together to provide you with the care you need, when you need it.  Your next appointment:   6 month(s)  Provider:   Redell Shallow, MD

## 2024-06-16 ENCOUNTER — Ambulatory Visit: Payer: Self-pay | Admitting: Cardiology

## 2024-06-16 ENCOUNTER — Ambulatory Visit (HOSPITAL_COMMUNITY)
Admission: RE | Admit: 2024-06-16 | Discharge: 2024-06-16 | Disposition: A | Discharge status: Home / Self Care | Source: Ambulatory Visit | Attending: Cardiovascular Disease | Admitting: Cardiovascular Disease

## 2024-06-16 DIAGNOSIS — R002 Palpitations: Secondary | ICD-10-CM | POA: Diagnosis present

## 2024-06-16 DIAGNOSIS — I10 Essential (primary) hypertension: Secondary | ICD-10-CM

## 2024-06-16 LAB — ECHOCARDIOGRAM COMPLETE: S' Lateral: 3.1 cm

## 2024-06-18 ENCOUNTER — Telehealth: Payer: Self-pay | Admitting: Internal Medicine

## 2024-06-18 NOTE — Telephone Encounter (Signed)
 Patient is calling to say that she has an appointment this Friday, 06/20/2024 and she is not sure if she needs to keep it or reschedule.  Patient states that her she was told that her lab levels were off and she needed repeat labs, but she does not remember if she was to reschedule the 06/20/2024 appointment or not and when she should schedule repeat labs.

## 2024-06-20 ENCOUNTER — Encounter: Payer: Self-pay | Admitting: Internal Medicine

## 2024-06-20 ENCOUNTER — Ambulatory Visit (INDEPENDENT_AMBULATORY_CARE_PROVIDER_SITE_OTHER): Admitting: Internal Medicine

## 2024-06-20 VITALS — BP 120/70 | HR 72 | Ht 69.0 in | Wt 232.6 lb

## 2024-06-20 DIAGNOSIS — E063 Autoimmune thyroiditis: Secondary | ICD-10-CM | POA: Diagnosis not present

## 2024-06-20 MED ORDER — LIOTHYRONINE SODIUM 5 MCG PO TABS: 2.5000 ug | ORAL_TABLET | Freq: Every day | ORAL | 3 refills | Status: DC

## 2024-06-20 MED ORDER — LEVOTHYROXINE SODIUM 50 MCG PO TABS
75.0000 ug | ORAL_TABLET | Freq: Every day | ORAL | 3 refills | Status: AC
Start: 2024-06-20 — End: ?

## 2024-06-20 NOTE — Progress Notes (Signed)
 Patient ID: Sheri Johnson, female   DOB: 03-04-1986, 38 y.o.   MRN: [id number]  HPI  Sheri Johnson is a 38 y.o.-year-old female, initially referred by her PCP, Juliane Che, PA, for management of Hashimoto's hypothyroidism.  Last visit 3 months ago. Patient saw Dr. Beryl before last visit 06/2023.  Interim history: At today's visit, she feels well.  She was able to lose 12 pounds since last visit. She recently was hospitalized with a high lipase, also high eosinophils. Her lipase decreased since but eosinophils are still high. She had an EGD which reportedly showed gastritis. She also has a polyp in her gall bladder. She feels that she has increased inflammation in her body. She mentions that she stopped Cytomel  at the beginning of the month after her TSH returned low, however, she does mention feeling better when she was taking this in the past.  Reviewed history: Pt. was dx with hypothyroidism in 2017 when she experienced weight gain and mild fatigue.  She was started on levothyroxine  then.  The dose was fluctuating due to variable thyroid  tests.  At our first visit she mentioned: - She was diagnosed with POTS and developed SVT 3 years ago, for which she had an ablation and she is also on beta-blockers.  No significant palpitations lately. She is trying to decrease the dose slowly, currently on atenolol  25 mg twice a day.  She is also taking many electrolyte supplements. - Heavy menses and feeling drained especially during menstrual cycles.  She tried oral contraceptives before, but she could not tolerate them due to SVT (seen by Dr. Waddell) - She feels that she is losing potassium (he had a low potassium previously) and possibly other electrolytes during her menstrual cycles. She is also taking a multivitamin and a B complex.  Of note, an aldosterone/renin ratio was not elevated on 10/15/2023 (6.6/3.159 for a potassium of 4.2) - She has iron deficiency and is taking the iron supplement. -  She has seen rheumatology for a positive ANA.  No clear rheumatologic disorder was found but she was told that she may have fibromyalgia.  She started to improve her diet, now low-carb, gluten-free.  Her GERD improved after she changed her diet so she could come off the PPI and H2 blocker. - She is very frustrated about inability to lose weight despite the above diet and also exercising 1.5 to 2 hours a day.  She feels that this may be the root problems, as she reportedly gained 40 pounds in the last 3 years. - she has hair loss and rash on her face and chest, for which she saw dermatology.  She was recommended clobetasol cream, but this is not helping.  She also tried minoxidil without results.  She suspects that this may be related to her Hashimoto's hypothyroidism, however, she was told by the dermatologist that this is not the case. - she complains of numbness and also tremors in her arms and legs.  She saw neurology and Parkinson disease was ruled out - She feels a lump in her neck and has occasional dysphagia.  She did not have imaging of her thyroid  and wonders if this could be done. - She also mentions fatigue and a sensation of being drained even between menstrual cycles  Pt. is on LT4 88 mcg daily (she stopped LT3) 11 days ago: - in am - fasting - at least 1h from b'fast - no calcium  - + iron 65 mg daily at 3 pm - + multivitamins at  night - + PPIs (Protonix ) at night - stopped B complex - w/o Biotin - On digestive enzymes and probiotics  She was advised to increase the dose 112 mcg >> did not increase b/c of previous  palpitations with >100 mcg daily.  I reviewed pt's thyroid  tests: Lab Results  Component Value Date   TSH 0.28 (L) 06/09/2024   TSH 1.76 05/16/2024   TSH 10.93 (H) 03/20/2024   TSH 8.905 (H) 07/21/2023   TSH 10.500 (H) 03/14/2023   TSH 7.761 (H) 04/21/2022   TSH 4.704 (H) 02/11/2022   TSH 10.935 (H) 01/30/2022   TSH 10.755 (H) 01/25/2022   FREET4 1.3 06/09/2024    FREET4 1.2 05/16/2024   FREET4 0.9 03/20/2024   FREET4 1.04 04/21/2022   FREET4 0.68 01/30/2022   T3FREE 3.8 06/09/2024   T3FREE 4.0 05/16/2024   T3FREE 2.8 03/20/2024    08/07/2023: TSH 3.021  Antithyroid antibodies: Lab Results  Component Value Date   THGAB 1 03/20/2024   No components found for: TPOAB  She has + FH of thyroid  disorders in: Mother. No FH of thyroid  cancer. No h/o radiation tx to head or neck. No recent use of iodine supplements.  No herbal supplements.  No recent steroid use.  Pt. also has a history of GAD - panic attacks, migraines, cervical radiculopathy, low back pain, polyarthralgia, TMJ, constipation, asthma, globus sensation.  She has a complicated 3rd pregnancy (04/2013 - HELLP). She was pregnant again earlier this year, but OB/GYN suggested termination 2/2 SVT and the uncontrolled hypothyroidism.  Also, per review from Dr. Karoline note: PMH: Acute appendicitis 12/14/2016  Allergic rhinitis 12/01/2015  ASCUS with positive high risk HPV cervical 12/06/2017  Asthma 12/01/2015  Depression  Depressive disorder 12/01/2015  Essential hypertension 07/05/2018  Opioid dependence (*) 07/05/2018  Panic attack 02/03/2016  Pre-eclampsia 07/05/2018  Previous cesarean delivery, delivered 07/05/2018  SVT (supraventricular tachycardia) (*) 01/2021  Tachycardia  Thrombocytopenic disorder (*) 07/05/2018   Past Surgical History:  Procedure Laterality Date  Appendectomy 12/2016  Cesarean section  Colonoscopy 08/20/2020   ROS: + see HPI  Past Medical History:  Diagnosis Date   Abortion 11/14/2021   Anxiety    Asthma    Frequent headaches    Hashimoto's disease    Hypertension    Hypothyroid    Lupus (systemic lupus erythematosus) (HCC)    SVT (supraventricular tachycardia)    Past Surgical History:  Procedure Laterality Date   CESAREAN SECTION     x2   LAPAROSCOPIC APPENDECTOMY N/A 12/14/2016   Procedure: APPENDECTOMY LAPAROSCOPIC;  Surgeon:  Curvin Deward MOULD, MD;  Location: [facility] OR;  Service: General;  Laterality: N/A;   SVT ABLATION N/A 03/31/2022   Procedure: SVT ABLATION;  Surgeon: Waddell Danelle ORN, MD;  Location: [facility] INVASIVE CV LAB;  Service: Cardiovascular;  Laterality: N/A;   Social History   Socioeconomic History   Marital status: Single    Spouse name: Not on file   Number of children: 3   Years of education: Not on file   Highest education level: Associate degree: academic program  Occupational History   Not on file  Tobacco Use   Smoking status: Never   Smokeless tobacco: Never  Vaping Use   Vaping status: Never Used  Substance and Sexual Activity   Alcohol use: Not Currently    Comment: Rare   Drug use: Never   Sexual activity: Yes  Other Topics Concern   Not on file  Social History Narrative   Caffeine- none  Social Drivers of Corporate Investment Banker Strain: Low Risk  (02/06/2024)   Received from [facility]   Overall Financial Resource Strain (CARDIA)    Difficulty of Paying Living Expenses: Not very hard  Food Insecurity: No Food Insecurity (02/06/2024)   Received from [facility]   Hunger Vital Sign    Within the past 12 months, you worried that your food would run out before you got the money to buy more.: Never true    Within the past 12 months, the food you bought just didn't last and you didn't have money to get more.: Never true  Transportation Needs: No Transportation Needs (02/06/2024)   Received from [facility] : [facility] - Transportation    Lack of Transportation (Medical): No    Lack of Transportation (Non-Medical): No  Physical Activity: Sufficiently Active (02/06/2024)   Received from [facility]   Exercise Vital Sign    On average, how many days per week do you engage in moderate to strenuous exercise (like a brisk walk)?: 5 days    On average, how many minutes do you engage in exercise at this level?: 60 min  Stress: Stress Concern Present (02/06/2024)   Received from [facility] - Occupational Stress Questionnaire    Feeling of Stress : To some extent  Social Connections: Moderately Integrated (02/06/2024)   Received from [facility]   Social Network    How would you rate your social network (family, work, friends)?: Adequate participation with social networks  Intimate Partner Violence: Not At Risk (05/08/2024)   Received from [facility]   HITS    Over the last 12 months how often did your partner physically hurt you?: Never    Over the last 12 months how often did your partner insult you or talk down to you?: Never    Over the last 12 months how often did your partner threaten you with physical harm?: Never    Over the last 12 months how often did your partner scream or curse at you?: Never   Current Outpatient Medications on File Prior to Visit  Medication Sig Dispense Refill   acetaminophen  (TYLENOL ) 325 MG tablet Take 2 tablets (650 mg total) by mouth every 6 (six) hours as needed. 36 tablet 0   albuterol  (VENTOLIN  HFA) 108 (90 Base) MCG/ACT inhaler Inhale 1-2 puffs into the lungs every 6 (six) hours as needed for wheezing or shortness of breath. 1 each 0   ALPRAZolam  (XANAX  XR) 0.5 MG 24 hr tablet Take by mouth.     ALPRAZolam  (XANAX  XR) 1 MG 24 hr tablet Take 1 mg by mouth as needed.     atenolol  (TENORMIN ) 25 MG tablet Take 50 mg (2 tablets) in the morning and 25 mg (1 tablet) in the evening (Patient taking differently: Take 25 mg by mouth 2 (two) times daily.) 270 tablet 3   azelastine (ASTELIN) 0.1 % nasal spray Place 1 spray into both nostrils 2 (two) times daily.     baclofen (LIORESAL) 10 MG tablet Take 1 tablet by mouth 3 (three) times daily as needed.     cetirizine (ZYRTEC) 10 MG tablet Take 10 mg by mouth 2 (two) times daily.     clobetasol (TEMOVATE) 0.05 % external solution Apply 1 Application topically at bedtime. Apply to scalp     Digestive Enzymes CAPS Take 1 capsule by mouth 2 (two) times  daily before a meal.  ferrous sulfate  325 (65 FE) MG EC tablet Take 1 tablet (325 mg total) by mouth daily with breakfast. 30 tablet 6   fluticasone (FLONASE) 50 MCG/ACT nasal spray Place 1 spray into both nostrils daily as needed for allergies or rhinitis.     fluticasone-salmeterol (ADVAIR HFA) 45-21 MCG/ACT inhaler Inhale 2 puffs into the lungs 2 (two) times daily.     gabapentin (NEURONTIN) 300 MG capsule Take 300 mg by mouth as needed.     ibuprofen  (ADVIL ) 600 MG tablet Take 1 tablet (600 mg total) by mouth every 6 (six) hours as needed. 30 tablet 0   levothyroxine  (SYNTHROID ) 88 MCG tablet Take 1 tablet (88 mcg total) by mouth daily. 45 tablet 3   linaclotide (LINZESS) 72 MCG capsule Take 72 mcg by mouth daily before breakfast.     liothyronine  (CYTOMEL ) 5 MCG tablet Take 0.5 tablets (2.5 mcg total) by mouth daily.     Magnesium  400 MG CAPS Take 400 mg by mouth daily as needed (Low Magnesium ).     Naltrexone HCl, Pain, 1.5 MG CAPS Take 1 capsule by mouth daily.     omeprazole (PRILOSEC) 40 MG capsule Take 20 mg by mouth daily.     potassium chloride  SA (KLOR-CON  M) 20 MEQ tablet Take 1 tablet (20 mEq total) by mouth 3 (three) times daily. 270 tablet 3   Probiotic Product (PROBIOTIC DAILY PO) Take 2 each by mouth at bedtime.     valACYclovir (VALTREX) 500 MG tablet Take 500 mg by mouth 2 (two) times daily.     No current facility-administered medications on file prior to visit.   Allergies  Allergen Reactions   Doxycycline  Other (See Comments) and Palpitations   Metoprolol  Other (See Comments)    dizziness   Opium  Other (See Comments)    All opioids   Cephalexin Diarrhea   Cipro [Ciprofloxacin Hcl] Palpitations   Prednisone Palpitations   Sulfamethoxazole -Trimethoprim  Diarrhea   Family History  Problem Relation Age of Onset   Hypertension Mother    Hyperthyroidism Mother    PE: BP 120/70   Pulse 72   Ht 5' 9 (1.753 m)   Wt 232 lb 9.6 oz (105.5 kg)   LMP 05/21/2024  (Exact Date)   SpO2 98%   BMI 34.35 kg/m  Wt Readings from Last 20 Encounters:  06/20/24 232 lb 9.6 oz (105.5 kg)  06/13/24 227 lb (103 kg)  05/24/24 232 lb (105.2 kg)  05/12/24 240 lb (108.9 kg)  03/20/24 244 lb (110.7 kg)  02/25/24 245 lb 9.6 oz (111.4 kg)  11/05/23 232 lb (105.2 kg)  08/15/23 236 lb 3.2 oz (107.1 kg)  07/21/23 238 lb 1.6 oz (108 kg)  06/30/23 238 lb (108 kg)  06/26/23 239 lb (108.4 kg)  06/08/23 230 lb (104.3 kg)  04/24/23 242 lb 12.8 oz (110.1 kg)  03/14/23 240 lb (108.9 kg)  02/12/23 247 lb 12.8 oz (112.4 kg)  01/16/23 240 lb (108.9 kg)  01/08/23 242 lb (109.8 kg)  01/03/23 240 lb (108.9 kg)  11/17/22 245 lb 3.2 oz (111.2 kg)  10/14/22 240 lb (108.9 kg)   Constitutional: overweight, in NAD, + rosacea on face and chest Eyes:  EOMI, no exophthalmos ENT: + Thyromegaly, no cervical lymphadenopathy Cardiovascular: RRR, No MRG Respiratory: CTA B Musculoskeletal: no deformities Skin:no rashes Neurological: no tremor with outstretched hands  ASSESSMENT: 1.  Uncontrolled Hashimoto's hypothyroidism  2. Fatigue -addressed at our first visit: - This is associated with many generalized symptoms: - She does  have POTS for which she is taking electrolytes and other supplements.  We also discussed about the dysautonomia clinic at [facility]. - For heavy menses, we discussed about the possibility of using the NuvaRing and replace it every 3 months. - Her potassium levels may improve after reducing her menstrual cycles, since she mentions that it is worse during menstruation.  She is taking a multivitamin.  She had normal aldosterone and PRA in 10/2023 per review of [facility] records, so there is no suspicion for hyperaldosteronism. - She has a history of iron deficiency reportedly, for which she takes iron.  A CBC showed a normal hemoglobin when checked earlier in the year.  We checked her ferritin at our first visit and this was low.  It was previously normal when checked in  08/2023 per review of [facility] records.  I advised her to try to take the iron every other day for better absorption. - Regarding her autoimmune thyroid  condition, we discussed about improving diet and given examples of foods that will decrease inflammation in the gut and therefore improve immune system. - Regarding weight gain, I am hoping that normalizing her thyroid  tests can help with this.  She has been with an elevated TSH for quite a long time but now after her cardiac ablation, she may be able to tolerate a higher dose of thyroid  medication. - Regarding hair loss, we discussed about possibly trying minoxidil at the higher concentration, and possibly starting biotin 1000 mcg daily, however, will leave the ultimate management for dermatology.  I advised her to stay off biotin whenever we need to check thyroid  tests. - She also mentions fatigue and a sensation of being drained even between menstrual cycles -she requested a vitamin D  and B12 check at her first visit.  These returned normal.  PLAN:  1.  Patient with longstanding hypothyroidism, previously on levothyroxine  therapy, currently on LT3 energy for treatment -At last visit, we discussed about Hashimoto's thyroiditis as an autoimmune disorder in which the immune system attacks around thyroid .  The antibodies bind to the thyroid  tissue and cause inflammation and eventually destruction of the gland with hypothyroidism.  I explained that thyroid  enlargement especially at the beginning of Hashimoto thyroiditis course is not uncommon and it has a waxing and waning character.  She does have thyromegaly on palpation of her neck and she also has a globus sensation.  At last visit, we checked a thyroid  ultrasound and this did not show any nodules, just heterogeneity.  We discussed that this is consistent with Hashimoto's thyroiditis. -At last visit we reviewed possible treatments for Hashimoto's thyroiditis.  She was on levothyroxine  100 mcg daily and upon  her questioning, and due to the history of fatigue, we discussed about possibly using LT3, also, either by adding it to LT4 or by using desiccated thyroid  hormones.  Her mother was taking Armour and was very happy with it.  We discussed about the benefits and possible side effects of Armour especially in her case as she felt that her SVT was well-controlled.  She did have anxiety and we discussed that the LT3 component of Armour may cause an increase in anxiety and I advised her to let me know if this happened. - Since last visit, she tried Armour 60 + 15 mg daily (~112 mcg LT4 equivalent dose daily) but we had to stop due to the medication containing pork extract. We switched to LT4 + LT3.  Her TSH was recently suppressed (06/09/2024): 0.28, so I advised her  to decrease the dose of LT3 from 5 to 2.5 mg daily.  We continued LT4 88 mcg daily.  At today's visit she tells me that she stopped the LT3 at that time and she continues on LT4 88 mcg daily. - At today's visit, she feels good on the above dose and she lost 12 pounds since last visit!  However, she still feels she has inflammation in her body and she wonders whether the LT4 could be to blame.  She mentions that ever since she changed her LT4 from 50 mcg daily, she does not feel well when she takes it.  I wonder if this could be related to the excipients in the tablet.  I suggested to go back to LT4 50 tablets which are white, without coloring agents.  She agrees with this.  She is interested in adding back LT4 3 and will add 2.5 mcg daily.  - we discussed about taking the thyroid  hormone every day, with water, >30 minutes before breakfast, separated by >4 hours from acid reflux medications, calcium , iron, multivitamins. Pt. is taking it correctly. - will check thyroid  tests in 1.5 mo after the above change: TSH, fT3 and fT4 - OTW, I will see her back in 6 months  Orders Placed This Encounter  Procedures   TSH   T4, free   T3, free   Requested  Prescriptions   Signed Prescriptions Disp Refills   levothyroxine  (SYNTHROID ) 50 MCG tablet 90 tablet 3    Sig: Take 1.5 tablets (75 mcg total) by mouth daily.   liothyronine  (CYTOMEL ) 5 MCG tablet 90 tablet 3    Sig: Take 0.5 tablets (2.5 mcg total) by mouth daily.   Lela Fendt, MD PhD [facility]

## 2024-06-20 NOTE — Patient Instructions (Addendum)
 Please change levothyroxine  75 mcg + liothyronine  2.5 mcg daily. Use 50 mcg levothyroxine  tablets.  Take the thyroid  hormones every day, with water, at least 30 minutes before breakfast, separated by at least 4 hours from: - acid reflux medications - calcium  - iron - multivitamins  Please return to see me in 6 months.

## 2024-06-24 ENCOUNTER — Encounter: Payer: Self-pay | Admitting: Internal Medicine

## 2024-07-01 ENCOUNTER — Encounter: Payer: Self-pay | Admitting: Cardiology

## 2024-07-01 DIAGNOSIS — I471 Supraventricular tachycardia, unspecified: Secondary | ICD-10-CM

## 2024-07-01 DIAGNOSIS — G90A Postural orthostatic tachycardia syndrome (POTS): Secondary | ICD-10-CM

## 2024-07-01 DIAGNOSIS — R002 Palpitations: Secondary | ICD-10-CM

## 2024-07-01 MED ORDER — IVABRADINE HCL 5 MG PO TABS: 2.5000 mg | ORAL_TABLET | Freq: Every day | ORAL | 3 refills | Status: DC

## 2024-07-02 NOTE — Addendum Note (Signed)
 Addended by: Coye Dawood W on: 07/02/2024 09:34 AM   Modules accepted: Orders

## 2024-07-02 NOTE — Addendum Note (Signed)
 Addended by: Catilyn Boggus W on: 07/02/2024 10:57 AM   Modules accepted: Orders

## 2024-07-09 MED ORDER — IVABRADINE HCL 5 MG PO TABS: 2.5000 mg | ORAL_TABLET | Freq: Two times a day (BID) | ORAL | 3 refills | Status: DC

## 2024-07-09 NOTE — Addendum Note (Signed)
 Addended by: RICHIE ADRIEN ORN on: 07/09/2024 05:05 PM   Modules accepted: Orders

## 2024-07-14 ENCOUNTER — Encounter: Payer: Self-pay | Admitting: Internal Medicine

## 2024-07-14 ENCOUNTER — Telehealth: Payer: Self-pay | Admitting: Cardiology

## 2024-07-14 DIAGNOSIS — E063 Autoimmune thyroiditis: Secondary | ICD-10-CM

## 2024-07-14 NOTE — Telephone Encounter (Signed)
 Pt. having thyrotoxic symptoms and would like the pituitary tests checked.  Since she is on LT4 and LT3, this is the most likely culprit and will suggest to decrease the dose of LT3 more.  Please see the MyChart message reply(ies) for my assessment and plan.    This patient gave consent for this Medical Advice Message and is aware that it may result in a bill to [employer], as well as the possibility of receiving a bill for a co-payment or deductible. They are an established patient, but are not seeking medical advice exclusively about a problem treated during an in person or video visit in the last seven days. I did not recommend an in person or video visit within seven days of my reply.    I spent a total of 7 minutes cumulative time within 7 days through [employer].  Lela Fendt, MD

## 2024-07-14 NOTE — Telephone Encounter (Signed)
 Received incoming STAT call from pt. Today she woke up not feeling well, she was sweating and shaky. She is unsure if it is r/t her hyperthyroidism (she does have Endocrinology appt next week).   She was at work and after eating lunch she saw her HR was up to 160 and thought she may be in SVT. EMS was called and when they arrived her HR was 100-120, then down to 90 and back up to 140 (it was all over the place). Pt states EMS told her the Rhythm was normal and nothing worrisome. BP was 170-180/92.   Her BP's have been very normal recently, 121/70, 128/70. HR =70's usually.   Recently has switched from Atenolol  to Ivabradine . Is on Ivabradine  2.5 mg BID. Has not completely weaned off of Atenolol  and is due for 12.5 mg at bedtime. She is wondering if she can take a higher dose of Ivabradine  once she completely stops Atenolol ?

## 2024-07-14 NOTE — Telephone Encounter (Signed)
 Pt c/o BP issue: STAT if pt c/o blurred vision, one-sided weakness or slurred speech.  STAT if BP is GREATER than 180/120 TODAY.  STAT if BP is LESS than 90/60 and SYMPTOMATIC TODAY  1. What is your BP concern? High blood pressure   2. Have you taken any BP medication today?No   3. What are your last 5 BP readings?164/92 today,   4. Are you having any other symptoms (ex. Dizziness, headache, blurred vision, passed out)? Dizziness, chest pain, flushed   Pt c/o medication issue:  1. Name of Medication: ivabradine  (CORLANOR) 5 MG TABS tablet   2. How are you currently taking this medication (dosage and times per day)?   Take 0.5 tablets (2.5 mg total) by mouth 2 (two) times daily with a meal.    3. Are you having a reaction (difficulty breathing--STAT)? Yes   4. What is your medication issue? Patient had an episode today were EMS had to come to her job. She had high blood pressure and heart rate. Please advise

## 2024-07-14 NOTE — Telephone Encounter (Signed)
Spoke with pt, Aware of dr crenshaw's recommendations.  Follow up scheduled  

## 2024-07-16 ENCOUNTER — Ambulatory Visit: Admitting: Nurse Practitioner

## 2024-07-16 ENCOUNTER — Ambulatory Visit: Attending: Cardiology | Admitting: Cardiology

## 2024-07-16 ENCOUNTER — Encounter: Payer: Self-pay | Admitting: Cardiology

## 2024-07-16 VITALS — BP 108/70 | HR 79 | Ht 69.0 in | Wt 236.9 lb

## 2024-07-16 DIAGNOSIS — I471 Supraventricular tachycardia, unspecified: Secondary | ICD-10-CM

## 2024-07-16 MED ORDER — IVABRADINE HCL 5 MG PO TABS: 5.0000 mg | ORAL_TABLET | Freq: Two times a day (BID) | ORAL | 3 refills | Status: AC

## 2024-07-16 NOTE — Progress Notes (Signed)
 Cardiology Office Note:  .   Date:  07/16/2024  ID:  Sheri Johnson, DOB Apr 11, 1986, MRN [id number] PCP: Juliane Che, PA  [facility] Providers Cardiologist:  Redell Shallow, MD Electrophysiologist:  Danelle Birmingham, MD {  History of Present Illness: .   Sheri Johnson is a 38 y.o. 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     SVT/dysautonomia 01/2021 SVT episode terminated with adenosine   03/2022 AVNRT ablation 04/2022 CAC score 0, dilated main pulmonary artery 31 mm. End of 2023 seen in the ED a couple times for palpitations with unremarkable workup, no recurrence of SVT.   07/2023 seen at [facility] for possible SVT.  Episode responsive to vagal maneuvers, EMS reported sinus tachycardia. 11/2022 reported chest and arm pain felt to be MSK.  3-day heart monitor ordered and she wore for less than 24 hours due to scar and irritation. 08/2023 reported elevated heart rates, she was concerned that she had POTS. 02/2024 seen by Dr. Birmingham reported palpitations improved, was weaning off of Toprol -XL. 06/2024 atenolol  stopped and ivabradine  started.  Chest pain 04/2022 coronary CTA CAC score of 0 12/2022 ED visit for atypical chest pain reproducible with palpation.  Negative troponins. 01/2023 left-sided chest pain rating down to her neck and shoulders.  Provided reassurance 04/2023 chest pain, heart racing, dizziness.  Continued medical management with beta-blocker. 06/2023 atypical chest pain going on for over 24 hours with negative troponins.  Recent pneumonia. 06/2024 reported continued chest pain, DOE and elevated heart rates.  Echocardiogram shows preserved biventricular function with no significant valvular disease     Patient has history of SVT status AVNRT ablation 03/2022 with good results.  She has been seen multiple times over the span of 1 to 2 years with multiple complaints of palpitations without any  documented recurrences of SVT.  She also at the same time has had multiple complaints of atypical/noncardiac chest pain during the same timeframe with CAC score of 04/2022.  She was last seen 06/2024 with continued complaints of the above and echocardiogram was repeated which was benign. Chest pain felt to be atypical.  Additionally during this timeframe ivabradine  started, atenolol  stopped at patient's request.  She was going to be sent to Dr. Lonni but Dr. Lonni not taking patient is currently.  She is also followed by endocrinology has had fluctuating TSH and multiple adjustments made to her thyroid  medication. 12/8 reported elevated heart rates, thought she was in SVT.    Today patient presents for urgent add-on and scheduled 15 minutes ago.  Patient reports that she has had ongoing symptoms of excited state/dysautonomia for years now.  She reports that she has had recent diagnosis of mast cell activation syndrome and has had elevated eosinophilia and following with allergist.  She reports a large portion of her symptoms occur when she has her period and that is when she starts having the cardiac symptoms of elevated blood pressure, tachycardia, palpitations.  Reports calling EMS again recently, reportedly not in SVT per her accounts.  Most of her symptoms are more related to a heightened arousal state (tremor, fast HR), however she does have instances where she does get dizzy and lightheaded.  Reports tachycardia can occur randomly and not always with activity.  She has a pulse oximeter at home and reports baseline heart rates can be in the 130s to 140s without doing anything.  However, she reports better relief with her ivabradine  and today requesting increase in  dose.   Studies Reviewed: SABRA    EKG Interpretation Date/Time:  Wednesday July 16 2024 13:34:33 EST Ventricular Rate:  79 PR Interval:  150 QRS Duration:  84 QT Interval:  406 QTC Calculation: 465 R Axis:   19  Text  Interpretation: Normal sinus rhythm Nonspecific T wave abnormality Prolonged QT When compared with ECG of 24-May-2024 06:14, PREVIOUS ECG IS PRESENT Confirmed by Darryle Currier [phone]) on 07/16/2024 1:44:16 PM    Risk Assessment/Calculations:    Physical Exam:   VS:  BP 108/70   Pulse 79   Ht 5' 9 (1.753 m)   Wt 236 lb 14.4 oz (107.5 kg)   LMP 07/15/2024   SpO2 97%   BMI 34.98 kg/m    Wt Readings from Last 3 Encounters:  07/16/24 236 lb 14.4 oz (107.5 kg)  06/20/24 232 lb 9.6 oz (105.5 kg)  06/13/24 227 lb (103 kg)    GEN: Well nourished, well developed in no acute distress NECK: No JVD; No carotid bruits CARDIAC: RRR, no murmurs, rubs, gallops RESPIRATORY:  Clear to auscultation without rales, wheezing or rhonchi  ABDOMEN: Soft, non-tender, non-distended EXTREMITIES:  No edema; No deformity   ASSESSMENT AND PLAN: .    Tachycardia/dysautonomia -06/2024 normal echocardiogram. She has had persistent issues with palpitations/tachycardia and heightened autonomic state for 1 to 2 years now.  She reports self diagnosis of POTS but vital signs below did not technically meet criteria.  Additionally it is difficult to quantify her self-reported episodes of tachycardia/inappropriate tachycardia as she has been intolerant to adhesive from the heart monitor before.  Does not appear that she has had any recurrences of SVT after her ablation though.  She has had good response to ivabradine  with better baseline heart rates and requesting increase in dose today.  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  She also does have thyroid  dysfunction on thyroid  medications that endocrinology is managing which complicates things.  She also relates most of her episodes are related to timing of her menstrual cycle which might have some hormonal activation further complicating things. Increase ivabradine  2.5 mg to 5 mg twice daily Continue to stress other  secondary prevention to treat potential dysautonomia in which she is doing.  Increase salt intake, avoid triggers, stay hydrated. Did not tolerate other beta-blockers secondary to low blood pressure and feeling like she was going to pass out. Inappropriate sinus tachycardia may be possible.  Will rule out other secondary etiologies and get CBC with differential, BMP, magnesium , iron panel given low ferritin levels previously. Unfortunately we have no other specialist within network to recommend to her given Dr. Waddell will be retiring.  Could consider [facility] but wait time likely very long. Of note she is on potassium and magnesium  supplementation.  Vital signs: BP lying 116/78, pulse 72 BP sitting 122/81, pulse 73. Orthostatics standing at 0 minutes 132/89, pulse 79. Orthostatics standing at 3 minutes BP 120/86, pulse 81.  Does not clearly meet criteria of for POTS which is sustained rise in HR >= 30 bpm within 10 minutes of standing, although test stopped prior to 10 min trend still seems unlikely to meet criteria.  SVT Status post ablation 2023 Seems like she has had good result, no documented recurrences, not clearly related to the above.  Continue ivabradine .  History of chest pain 04/2022 reassuring coronary CTA previously with a CAC score of 0.  Presentation previously is felt to be atypical.  No complaints today.  Thyroid   dysfunction Followed very closely by endocrinology who have been treating her thyroid  medications frequently over the past couple months with fluctuating thyroid  levels.   Hypertension Blood pressure well-controlled today.  She is not on any medications.  She likely has elevated readings in the setting of acute episodes of tachycardia which is expected.   Dispo: Should see MD. follow-up with Dr. Pietro 2 to 3 months.  Signed, Thom LITTIE Sluder, PA-C

## 2024-07-16 NOTE — Patient Instructions (Signed)
 Medication Instructions:  Increase Ivabradine  to 5mg  2 times per day.  *If you need a refill on your cardiac medications before your next appointment, please call your pharmacy*  Lab Work: Today: BMP, Magnesium , CBC w/ Diff, Iron Panel If you have labs (blood work) drawn today and your tests are completely normal, you will receive your results only by: MyChart Message (if you have MyChart) OR A paper copy in the mail If you have any lab test that is abnormal or we need to change your treatment, we will call you to review the results.  Testing/Procedures: None   Follow-Up: At [facility], you and your health needs are our priority.  As part of our continuing mission to provide you with exceptional heart care, our providers are all part of one team.  This team includes your primary Cardiologist (physician) and Advanced Practice Providers or APPs (Physician Assistants and Nurse Practitioners) who all work together to provide you with the care you need, when you need it.  Your next appointment:   3 month(s)  Provider:   Redell Shallow, MD    We recommend signing up for the patient portal called MyChart.  Sign up information is provided on this After Visit Summary.  MyChart is used to connect with patients for Virtual Visits (Telemedicine).  Patients are able to view lab/test results, encounter notes, upcoming appointments, etc.  Non-urgent messages can be sent to your provider as well.   To learn more about what you can do with MyChart, go to [email or web address].   Other Instructions None

## 2024-07-17 ENCOUNTER — Ambulatory Visit: Payer: Self-pay | Admitting: Cardiology

## 2024-07-17 LAB — CBC WITH DIFFERENTIAL/PLATELET
Basophils Absolute: 0 x10E3/uL (ref 0.0–0.2)
Basos: 1 %
EOS (ABSOLUTE): 0.7 x10E3/uL — ABNORMAL HIGH (ref 0.0–0.4)
Eos: 9 %
Hematocrit: 43.6 % (ref 34.0–46.6)
Hemoglobin: 14.4 g/dL (ref 11.1–15.9)
Immature Grans (Abs): 0 x10E3/uL (ref 0.0–0.1)
Immature Granulocytes: 0 %
Lymphocytes Absolute: 2 x10E3/uL (ref 0.7–3.1)
Lymphs: 29 %
MCH: 29 pg (ref 26.6–33.0)
MCHC: 33 g/dL (ref 31.5–35.7)
MCV: 88 fL (ref 79–97)
Monocytes Absolute: 0.5 x10E3/uL (ref 0.1–0.9)
Monocytes: 7 %
Neutrophils Absolute: 3.8 x10E3/uL (ref 1.4–7.0)
Neutrophils: 54 %
Platelets: 272 x10E3/uL (ref 150–450)
RBC: 4.97 x10E6/uL (ref 3.77–5.28)
RDW: 13.1 % (ref 11.7–15.4)
WBC: 7.1 x10E3/uL (ref 3.4–10.8)

## 2024-07-17 LAB — HEPATIC FUNCTION PANEL
ALT: 17 IU/L (ref 0–32)
AST: 16 IU/L (ref 0–40)
Albumin: 4.7 g/dL (ref 3.9–4.9)
Alkaline Phosphatase: 43 IU/L (ref 41–116)
Bilirubin Total: 0.4 mg/dL (ref 0.0–1.2)
Bilirubin, Direct: 0.12 mg/dL (ref 0.00–0.40)
Total Protein: 7 g/dL (ref 6.0–8.5)

## 2024-07-17 LAB — MAGNESIUM: Magnesium: 2.2 mg/dL (ref 1.6–2.3)

## 2024-07-17 LAB — IRON,TIBC AND FERRITIN PANEL
Ferritin: 34 ng/mL (ref 15–150)
Iron Saturation: 21 % (ref 15–55)
Iron: 75 ug/dL (ref 27–159)
Total Iron Binding Capacity: 361 ug/dL (ref 250–450)
UIBC: 286 ug/dL (ref 131–425)

## 2024-07-17 LAB — BASIC METABOLIC PANEL WITH GFR
BUN/Creatinine Ratio: 10 (ref 9–23)
BUN: 10 mg/dL (ref 6–24)
CO2: 24 mmol/L (ref 20–29)
Calcium: 9.6 mg/dL (ref 8.7–10.2)
Chloride: 102 mmol/L (ref 96–106)
Creatinine, Ser: 0.98 mg/dL (ref 0.57–1.00)
Glucose: 76 mg/dL (ref 70–99)
Potassium: 4.3 mmol/L (ref 3.5–5.2)
Sodium: 140 mmol/L (ref 134–144)
eGFR: 75 mL/min/1.73 (ref >59)

## 2024-07-21 ENCOUNTER — Other Ambulatory Visit: Payer: Self-pay | Admitting: Internal Medicine

## 2024-07-21 DIAGNOSIS — R232 Flushing: Secondary | ICD-10-CM

## 2024-07-21 DIAGNOSIS — R002 Palpitations: Secondary | ICD-10-CM

## 2024-07-24 ENCOUNTER — Encounter: Payer: Self-pay | Admitting: Cardiology

## 2024-07-25 ENCOUNTER — Telehealth: Payer: Self-pay | Admitting: Internal Medicine

## 2024-07-25 ENCOUNTER — Other Ambulatory Visit

## 2024-07-25 NOTE — Telephone Encounter (Signed)
 I am not able to check her toxins -this should be per PCP.  She had aldosterone and renin checked in 10/2023 and these were normal.

## 2024-07-25 NOTE — Telephone Encounter (Signed)
 Patient came in for labs today,she stated that her property company has confirmed moisture/mold in her home.She was wanting to see if Dr.Gherghe could have these lab tests done possibly if there are allowed: Aldosterone and Mycotoxins.Please advise,she can come in next week if they can be done.

## 2024-07-28 ENCOUNTER — Ambulatory Visit: Payer: Self-pay | Admitting: Internal Medicine

## 2024-07-28 ENCOUNTER — Other Ambulatory Visit: Payer: Self-pay | Admitting: Internal Medicine

## 2024-07-30 LAB — INSULIN-LIKE GROWTH FACTOR
IGF-I, LC/MS: 124 ng/mL (ref 52–328)
Z-Score (Female): -0.3 {STDV}

## 2024-07-30 LAB — METANEPHRINES, PLASMA
Metanephrine, Free: 27 pg/mL
Normetanephrine, Free: 76 pg/mL
Total Metanephrines-Plasma: 103 pg/mL

## 2024-07-30 LAB — CALCITONIN: Calcitonin: <2 pg/mL

## 2024-07-30 LAB — CATECHOLAMINES, FRACTIONATED, PLASMA
Dopamine: <10 pg/mL
Epinephrine: 24 pg/mL
Norepinephrine: 427 pg/mL
Total Catecholamines: <461 pg/mL

## 2024-08-01 ENCOUNTER — Ambulatory Visit: Payer: Self-pay | Admitting: Internal Medicine

## 2024-08-01 ENCOUNTER — Other Ambulatory Visit: Payer: Self-pay | Admitting: Internal Medicine

## 2024-08-01 DIAGNOSIS — E063 Autoimmune thyroiditis: Secondary | ICD-10-CM

## 2024-08-01 LAB — THYROGLOBULIN ANTIBODY: Thyroglobulin Ab: 1 [IU]/mL

## 2024-08-01 LAB — T4, FREE: Free T4: 0.9 ng/dL (ref 0.8–1.8)

## 2024-08-01 LAB — T3, FREE: T3, Free: 2.3 pg/mL (ref 2.3–4.2)

## 2024-08-01 LAB — TSH: TSH: 5.84 m[IU]/L — ABNORMAL HIGH

## 2024-08-01 LAB — THYROID PEROXIDASE ANTIBODY: Thyroperoxidase Ab SerPl-aCnc: 309 [IU]/mL — ABNORMAL HIGH

## 2024-08-12 ENCOUNTER — Encounter: Payer: Self-pay | Admitting: Internal Medicine

## 2024-09-12 ENCOUNTER — Ambulatory Visit: Payer: Self-pay | Admitting: Nurse Practitioner

## 2024-09-12 ENCOUNTER — Ambulatory Visit
Admission: EM | Admit: 2024-09-12 | Discharge: 2024-09-12 | Disposition: A | Discharge status: Home / Self Care | Source: Home / Self Care

## 2024-09-12 ENCOUNTER — Ambulatory Visit (INDEPENDENT_AMBULATORY_CARE_PROVIDER_SITE_OTHER)

## 2024-09-12 DIAGNOSIS — R051 Acute cough: Secondary | ICD-10-CM

## 2024-09-12 DIAGNOSIS — J4521 Mild intermittent asthma with (acute) exacerbation: Secondary | ICD-10-CM

## 2024-09-12 DIAGNOSIS — B349 Viral infection, unspecified: Secondary | ICD-10-CM

## 2024-09-12 LAB — POC SOFIA SARS ANTIGEN FIA: SARS Coronavirus 2 Ag: NEGATIVE

## 2024-09-12 MED ORDER — PREDNISONE 20 MG PO TABS: 40.0000 mg | ORAL_TABLET | Freq: Every day | ORAL | 0 refills | Status: AC

## 2024-09-12 MED ORDER — IPRATROPIUM BROMIDE 0.03 % NA SOLN: 2.0000 | Freq: Two times a day (BID) | NASAL | 0 refills | Status: AC | PRN

## 2024-09-12 NOTE — ED Provider Notes (Signed)
 " [facility]    CSN: [id number] Arrival date & time: 09/12/24  0808      History   Chief Complaint Chief Complaint  Patient presents with   Sore Throat   Ear Fullness    HPI Sheri Johnson is a 39 y.o. female  presents for evaluation of URI symptoms for 5 days. Patient reports associated symptoms of dry cough, congestion, shortness of breath/chest tightness, left-sided sore throat, ear fullness. Denies N/V/D, fevers. Patient does have a hx of asthma.  Albuterol  inhaler that she has used.  Patient not not an active smoker.   Reports no known sick contacts.  Pt has taken musinex and teas and honey OTC for symptoms. Pt has no other concerns at this time.    Sore Throat Associated symptoms include shortness of breath.  Ear Fullness Associated symptoms include shortness of breath.    Past Medical History:  Diagnosis Date   Abortion 11/14/2021   Anxiety    Asthma    Frequent headaches    Hashimoto's disease    Hypertension    Hypothyroid    Lupus (systemic lupus erythematosus) (HCC)    SVT (supraventricular tachycardia)     Patient Active Problem List   Diagnosis Date Noted   Hypothyroidism due to Hashimoto's thyroiditis 03/20/2024   Neck pain 03/14/2023   Paresthesia 03/14/2023   Sciatica associated with disorder of lumbar spine 03/14/2023   Migraine without aura and without status migrainosus, not intractable 03/14/2023   Witnessed episode of apnea 01/16/2023   Hypertension 12/14/2021   Palpitations 12/14/2021   Chest pain 12/14/2021   SVT (supraventricular tachycardia) 12/13/2021   Acute appendicitis 12/14/2016   Appendicitis 12/14/2016    Past Surgical History:  Procedure Laterality Date   CESAREAN SECTION     x2   LAPAROSCOPIC APPENDECTOMY N/A 12/14/2016   Procedure: APPENDECTOMY LAPAROSCOPIC;  Surgeon: Curvin Deward MOULD, MD;  Location: [facility] OR;  Service: General;  Laterality: N/A;   SVT ABLATION N/A 03/31/2022   Procedure: SVT ABLATION;  Surgeon:  Waddell Danelle ORN, MD;  Location: [facility] INVASIVE CV LAB;  Service: Cardiovascular;  Laterality: N/A;    OB History   No obstetric history on file.      Home Medications    Prior to Admission medications  Medication Sig Start Date End Date Taking? Authorizing Provider  ipratropium (ATROVENT ) 0.03 % nasal spray Place 2 sprays into both nostrils every 12 (twelve) hours as needed for rhinitis. 09/12/24  Yes Damier Disano, Jodi R, NP  predniSONE  (DELTASONE ) 20 MG tablet Take 2 tablets (40 mg total) by mouth daily with breakfast for 3 days. 09/12/24 09/15/24 Yes Vollie Aaron, Jodi R, NP  acetaminophen  (TYLENOL ) 325 MG tablet Take 2 tablets (650 mg total) by mouth every 6 (six) hours as needed. 06/30/23   Elnor Jayson LABOR, DO  albuterol  (VENTOLIN  HFA) 108 (90 Base) MCG/ACT inhaler Inhale 1-2 puffs into the lungs every 6 (six) hours as needed for wheezing or shortness of breath. 06/05/23   Dontavion Noxon, Jodi R, NP  ALPRAZolam  (XANAX  XR) 0.5 MG 24 hr tablet Take by mouth. 07/25/22   [provider]  ALPRAZolam  (XANAX  XR) 1 MG 24 hr tablet Take 1 mg by mouth as needed.    [provider]  azelastine (ASTELIN) 0.1 % nasal spray Place 1 spray into both nostrils 2 (two) times daily. 06/09/24   [provider]  baclofen (LIORESAL) 10 MG tablet Take 1 tablet by mouth 3 (three) times daily as needed. 06/29/23   [provider]  cetirizine (ZYRTEC) 10 MG tablet Take 10 mg by mouth 2 (two) times daily.    [provider]  clobetasol (TEMOVATE) 0.05 % external solution Apply 1 Application topically at bedtime. Apply to scalp 02/27/22   [provider]  Digestive Enzymes CAPS Take 1 capsule by mouth 2 (two) times daily before a meal.    [provider]  ferrous sulfate  325 (65 FE) MG EC tablet Take 1 tablet (325 mg total) by mouth daily with breakfast. 01/09/24   Onita Duos, MD  fluticasone (FLONASE) 50 MCG/ACT nasal spray Place 1 spray into both nostrils daily as needed for allergies or  rhinitis.    [provider]  fluticasone-salmeterol (ADVAIR HFA) 45-21 MCG/ACT inhaler Inhale 2 puffs into the lungs 2 (two) times daily.    [provider]  gabapentin (NEURONTIN) 300 MG capsule Take 300 mg by mouth as needed. 01/11/23   [provider]  ibuprofen  (ADVIL ) 600 MG tablet Take 1 tablet (600 mg total) by mouth every 6 (six) hours as needed. 06/30/23   Elnor Jayson LABOR, DO  ivabradine  (CORLANOR) 5 MG TABS tablet Take 1 tablet (5 mg total) by mouth 2 (two) times daily with a meal. 07/16/24   Pietro Redell RAMAN, MD  levothyroxine  (SYNTHROID ) 50 MCG tablet Take 1.5 tablets (75 mcg total) by mouth daily. 06/20/24   Trixie File, MD  linaclotide LARUE) 72 MCG capsule Take 72 mcg by mouth daily before breakfast. 05/29/24   [provider]  Magnesium  400 MG CAPS Take 400 mg by mouth daily as needed (Low Magnesium ).    [provider]  Naltrexone HCl, Pain, 1.5 MG CAPS Take 1 capsule by mouth daily.    [provider]  omeprazole (PRILOSEC) 40 MG capsule Take 20 mg by mouth daily. 05/29/24   [provider]  potassium chloride  SA (KLOR-CON  M) 20 MEQ tablet Take 1 tablet (20 mEq total) by mouth 3 (three) times daily. 05/21/24   Pietro Redell RAMAN, MD  Probiotic Product (PROBIOTIC DAILY PO) Take 2 each by mouth at bedtime.    [provider]  valACYclovir (VALTREX) 500 MG tablet Take 500 mg by mouth 2 (two) times daily.    [provider]    Family History Family History  Problem Relation Age of Onset   Hypertension Mother    Hyperthyroidism Mother     Social History Social History[1]   Allergies   Doxycycline , Metoprolol , Opium , Cephalexin, Cipro [ciprofloxacin hcl], Prednisone , and Sulfamethoxazole -trimethoprim    Review of Systems Review of Systems  HENT:  Positive for congestion and sore throat.   Respiratory:  Positive for cough, chest tightness and shortness of breath.      Physical  Exam Triage Vital Signs ED Triage Vitals  Encounter Vitals Group     BP 09/12/24 0820 125/83     Girls Systolic BP Percentile --      Girls Diastolic BP Percentile --      Boys Systolic BP Percentile --      Boys Diastolic BP Percentile --      Pulse Rate 09/12/24 0819 (!) 111     Resp 09/12/24 0819 16     Temp 09/12/24 0820 98.9 F (37.2 C)     Temp src --      SpO2 09/12/24 0819 96 %     Weight --      Height --      Head Circumference --      Peak Flow --  Pain Score 09/12/24 0819 8     Pain Loc --      Pain Education --      Exclude from Growth Chart --    No data found.  Updated Vital Signs BP 125/83   Pulse (!) 111   Temp 98.9 F (37.2 C)   Resp 16   LMP 09/09/2024 (Exact Date)   SpO2 96%   Visual Acuity Right Eye Distance:   Left Eye Distance:   Bilateral Distance:    Right Eye Near:   Left Eye Near:    Bilateral Near:     Physical Exam Vitals and nursing note reviewed.  Constitutional:      General: She is not in acute distress.    Appearance: She is well-developed. She is not ill-appearing.  HENT:     Head: Normocephalic and atraumatic.     Right Ear: Tympanic membrane and ear canal normal.     Left Ear: Tympanic membrane and ear canal normal.     Nose: Congestion present.     Mouth/Throat:     Mouth: Mucous membranes are moist.     Pharynx: Oropharynx is clear. Uvula midline. No oropharyngeal exudate or posterior oropharyngeal erythema.     Tonsils: No tonsillar exudate or tonsillar abscesses.  Eyes:     Conjunctiva/sclera: Conjunctivae normal.     Pupils: Pupils are equal, round, and reactive to light.  Cardiovascular:     Rate and Rhythm: Regular rhythm. Tachycardia present.     Heart sounds: Normal heart sounds.  Pulmonary:     Effort: Pulmonary effort is normal.     Breath sounds: Normal breath sounds. No wheezing, rhonchi or rales.  Musculoskeletal:     Cervical back: Normal range of motion and neck supple.  Lymphadenopathy:      Cervical: No cervical adenopathy.  Skin:    General: Skin is warm and dry.  Neurological:     General: No focal deficit present.     Mental Status: She is alert and oriented to person, place, and time.  Psychiatric:        Mood and Affect: Mood normal.        Behavior: Behavior normal.      UC Treatments / Results  Labs (all labs ordered are listed, but only abnormal results are displayed) Labs Reviewed  POC SOFIA SARS ANTIGEN FIA    EKG   Radiology DG Chest 2 View Result Date: 09/12/2024 CLINICAL DATA:  Cough for 5 days.  History of pneumonia. EXAM: CHEST - 2 VIEW COMPARISON:  11/05/2023 FINDINGS: Cardiomediastinal silhouette and pulmonary vasculature are within normal limits. Lungs are clear. IMPRESSION: No acute cardiopulmonary process. Electronically Signed   By: Aliene Lloyd M.D.   On: 09/12/2024 09:03    Procedures Procedures (including critical care time)  Medications Ordered in UC Medications - No data to display  Initial Impression / Assessment and Plan / UC Course  I have reviewed the triage vital signs and the nursing notes.  Pertinent labs & imaging results that were available during my care of the patient were reviewed by me and considered in my medical decision making (see chart for details).     Reviewed exam and symptoms with patient.  Negative COVID test and chest x-ray negative for pneumonia.  Discussed viral illness and asthma exacerbation.  Patient reports history of elevated eosinophils and was advised to avoid prednisone  if possible.  Discussed with her regarding her asthma symptoms.  She is in agreement and will  do short course for 3 days of prednisone .  She will continue her albuterol  inhaler as needed.  Also advised to continue Mucinex OTC.  Atrovent  nasal spray as needed.  Encouraged rest fluids PCP follow-up 2 days for recheck.  ER precautions reviewed. Final Clinical Impressions(s) / UC Diagnoses   Final diagnoses:  Acute cough  Mild  intermittent asthma with acute exacerbation  Viral illness     Discharge Instructions      You tested negative for COVID and your chest x-ray was negative for pneumonia.  Start prednisone  daily for 3 days.  Use Atrovent  nasal spray as needed for your nasal congestion.  Continue over-the-counter Mucinex as needed.  Lots of rest and fluids.  Follow-up with your PCP in 2 days for recheck.  Please go to the emergency room for any worsening symptoms.  Hope you feel better soon!     ED Prescriptions     Medication Sig Dispense Auth. Provider   predniSONE  (DELTASONE ) 20 MG tablet Take 2 tablets (40 mg total) by mouth daily with breakfast for 3 days. 6 tablet Anthea Udovich, Jodi R, NP   ipratropium (ATROVENT ) 0.03 % nasal spray Place 2 sprays into both nostrils every 12 (twelve) hours as needed for rhinitis. 30 mL Tivon Lemoine, Jodi R, NP      PDMP not reviewed this encounter.     [1]  Social History Tobacco Use   Smoking status: Never   Smokeless tobacco: Never  Vaping Use   Vaping status: Never Used  Substance Use Topics   Alcohol use: Not Currently    Comment: Rare   Drug use: Never     Loreda Myla SAUNDERS, NP 09/12/24 [phone]  "

## 2024-09-12 NOTE — ED Triage Notes (Signed)
 Pt present with trouble breathing, chest tightness x 5 days and has progressively getting worse. Pt states she has pain and soreness on the lt side of her throat.  Pt had cold sweats this morning and lt ear fullness.  Home interventions: Mucinex

## 2024-09-12 NOTE — Discharge Instructions (Signed)
 You tested negative for COVID and your chest x-ray was negative for pneumonia.  Start prednisone  daily for 3 days.  Use Atrovent  nasal spray as needed for your nasal congestion.  Continue over-the-counter Mucinex as needed.  Lots of rest and fluids.  Follow-up with your PCP in 2 days for recheck.  Please go to the emergency room for any worsening symptoms.  Hope you feel better soon!

## 2024-10-20 ENCOUNTER — Ambulatory Visit: Admitting: Cardiology

## 2024-12-18 ENCOUNTER — Ambulatory Visit: Admitting: Internal Medicine
